# Patient Record
Sex: Female | Born: 1960 | Race: White | Hispanic: No | Smoking: Former smoker
Health system: Southern US, Community
[De-identification: ages and names within clinical notes are randomized; demographics above are authoritative.]

## PROBLEM LIST (undated history)

## (undated) DIAGNOSIS — R569 Unspecified convulsions: Secondary | ICD-10-CM

## (undated) DIAGNOSIS — G43909 Migraine, unspecified, not intractable, without status migrainosus: Secondary | ICD-10-CM

## (undated) DIAGNOSIS — F32A Depression, unspecified: Secondary | ICD-10-CM

## (undated) DIAGNOSIS — F419 Anxiety disorder, unspecified: Secondary | ICD-10-CM

## (undated) DIAGNOSIS — R51 Headache: Secondary | ICD-10-CM

## (undated) DIAGNOSIS — K589 Irritable bowel syndrome without diarrhea: Secondary | ICD-10-CM

## (undated) DIAGNOSIS — J40 Bronchitis, not specified as acute or chronic: Secondary | ICD-10-CM

## (undated) DIAGNOSIS — M1990 Unspecified osteoarthritis, unspecified site: Secondary | ICD-10-CM

## (undated) DIAGNOSIS — M797 Fibromyalgia: Secondary | ICD-10-CM

## (undated) DIAGNOSIS — F329 Major depressive disorder, single episode, unspecified: Secondary | ICD-10-CM

## (undated) DIAGNOSIS — E039 Hypothyroidism, unspecified: Secondary | ICD-10-CM

## (undated) DIAGNOSIS — G8929 Other chronic pain: Secondary | ICD-10-CM

## (undated) DIAGNOSIS — Z87442 Personal history of urinary calculi: Secondary | ICD-10-CM

## (undated) DIAGNOSIS — M549 Dorsalgia, unspecified: Secondary | ICD-10-CM

## (undated) DIAGNOSIS — K219 Gastro-esophageal reflux disease without esophagitis: Secondary | ICD-10-CM

## (undated) DIAGNOSIS — R002 Palpitations: Secondary | ICD-10-CM

## (undated) DIAGNOSIS — J45909 Unspecified asthma, uncomplicated: Secondary | ICD-10-CM

## (undated) DIAGNOSIS — D649 Anemia, unspecified: Secondary | ICD-10-CM

## (undated) DIAGNOSIS — F319 Bipolar disorder, unspecified: Secondary | ICD-10-CM

## (undated) HISTORY — PX: BACK SURGERY: SHX140

## (undated) HISTORY — PX: CHOLECYSTECTOMY: SHX55

## (undated) HISTORY — DX: Bipolar disorder, unspecified: F31.9

## (undated) HISTORY — DX: Migraine, unspecified, not intractable, without status migrainosus: G43.909

## (undated) HISTORY — PX: TUBAL LIGATION: SHX77

## (undated) HISTORY — PX: KNEE ARTHROSCOPY: SUR90

## (undated) HISTORY — PX: OTHER SURGICAL HISTORY: SHX169

---
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
## 1990-12-21 HISTORY — PX: ABDOMINAL HYSTERECTOMY: SHX81

## 2009-07-09 DIAGNOSIS — G8929 Other chronic pain: Secondary | ICD-10-CM | POA: Diagnosis present

## 2009-12-04 ENCOUNTER — Encounter: Admission: RE | Admit: 2009-12-04 | Discharge: 2009-12-04 | Payer: Self-pay | Admitting: Neurological Surgery

## 2010-04-24 ENCOUNTER — Inpatient Hospital Stay (HOSPITAL_COMMUNITY): Admission: RE | Admit: 2010-04-24 | Discharge: 2010-04-26 | Payer: Self-pay | Admitting: Neurological Surgery

## 2010-06-03 ENCOUNTER — Encounter: Admission: RE | Admit: 2010-06-03 | Discharge: 2010-06-03 | Payer: Self-pay | Admitting: Neurological Surgery

## 2010-07-10 DIAGNOSIS — F411 Generalized anxiety disorder: Secondary | ICD-10-CM | POA: Diagnosis present

## 2010-08-05 ENCOUNTER — Encounter: Admission: RE | Admit: 2010-08-05 | Discharge: 2010-08-05 | Payer: Self-pay | Admitting: Neurological Surgery

## 2010-09-23 ENCOUNTER — Encounter: Admission: RE | Admit: 2010-09-23 | Discharge: 2010-09-23 | Payer: Self-pay | Admitting: Neurological Surgery

## 2011-01-13 ENCOUNTER — Encounter
Admission: RE | Admit: 2011-01-13 | Discharge: 2011-01-13 | Payer: Self-pay | Source: Home / Self Care | Attending: Neurological Surgery | Admitting: Neurological Surgery

## 2011-03-10 LAB — CBC
Platelets: 232 10*3/uL (ref 150–400)
RBC: 4.4 MIL/uL (ref 3.87–5.11)
WBC: 5 10*3/uL (ref 4.0–10.5)

## 2011-03-10 LAB — SURGICAL PCR SCREEN
MRSA, PCR: NEGATIVE
Staphylococcus aureus: POSITIVE — AB

## 2011-03-10 LAB — BASIC METABOLIC PANEL
BUN: 16 mg/dL (ref 6–23)
Chloride: 105 mEq/L (ref 96–112)
Creatinine, Ser: 0.93 mg/dL (ref 0.4–1.2)
GFR calc Af Amer: >60 mL/min (ref >60)
GFR calc non Af Amer: >60 mL/min (ref >60)

## 2011-03-10 LAB — APTT: aPTT: 28 seconds (ref 24–37)

## 2011-03-10 LAB — PROTIME-INR
INR: 0.92 (ref 0.00–1.49)
Prothrombin Time: 12.3 seconds (ref 11.6–15.2)

## 2011-03-10 LAB — TYPE AND SCREEN: ABO/RH(D): O POS

## 2011-03-10 LAB — DIFFERENTIAL
Lymphocytes Relative: 20 % (ref 12–46)
Lymphs Abs: 1 10*3/uL (ref 0.7–4.0)
Monocytes Relative: 7 % (ref 3–12)
Neutro Abs: 3.4 10*3/uL (ref 1.7–7.7)
Neutrophils Relative %: 69 % (ref 43–77)

## 2011-03-10 LAB — ABO/RH: ABO/RH(D): O POS

## 2011-03-31 ENCOUNTER — Other Ambulatory Visit: Payer: Self-pay | Admitting: Neurological Surgery

## 2011-03-31 ENCOUNTER — Ambulatory Visit
Admission: RE | Admit: 2011-03-31 | Discharge: 2011-03-31 | Disposition: A | Discharge status: Home / Self Care | Payer: Worker's Compensation | Source: Ambulatory Visit | Attending: Neurological Surgery | Admitting: Neurological Surgery

## 2011-03-31 DIAGNOSIS — M549 Dorsalgia, unspecified: Secondary | ICD-10-CM

## 2011-09-21 ENCOUNTER — Other Ambulatory Visit: Payer: Self-pay | Admitting: Neurological Surgery

## 2011-09-21 ENCOUNTER — Ambulatory Visit
Admission: RE | Admit: 2011-09-21 | Discharge: 2011-09-21 | Disposition: A | Discharge status: Home / Self Care | Payer: Worker's Compensation | Source: Ambulatory Visit | Attending: Neurological Surgery | Admitting: Neurological Surgery

## 2011-09-21 DIAGNOSIS — M5137 Other intervertebral disc degeneration, lumbosacral region: Secondary | ICD-10-CM

## 2011-09-21 DIAGNOSIS — M5126 Other intervertebral disc displacement, lumbar region: Secondary | ICD-10-CM

## 2011-11-20 ENCOUNTER — Encounter (HOSPITAL_COMMUNITY): Payer: Self-pay | Admitting: Pharmacy Technician

## 2011-11-23 ENCOUNTER — Inpatient Hospital Stay (HOSPITAL_COMMUNITY): Admission: RE | Admit: 2011-11-23 | Payer: Self-pay | Source: Ambulatory Visit

## 2011-11-24 ENCOUNTER — Other Ambulatory Visit: Payer: Self-pay

## 2011-11-24 ENCOUNTER — Encounter (HOSPITAL_COMMUNITY): Payer: Self-pay

## 2011-11-24 ENCOUNTER — Ambulatory Visit (HOSPITAL_COMMUNITY)
Admission: RE | Admit: 2011-11-24 | Discharge: 2011-11-24 | Disposition: A | Discharge status: Home / Self Care | Payer: Worker's Compensation | Source: Ambulatory Visit | Attending: Neurological Surgery | Admitting: Neurological Surgery

## 2011-11-24 ENCOUNTER — Encounter (HOSPITAL_COMMUNITY)
Admission: RE | Admit: 2011-11-24 | Discharge: 2011-11-24 | Disposition: A | Discharge status: Home / Self Care | Payer: Worker's Compensation | Source: Ambulatory Visit | Attending: Neurological Surgery | Admitting: Neurological Surgery

## 2011-11-24 DIAGNOSIS — Z01812 Encounter for preprocedural laboratory examination: Secondary | ICD-10-CM | POA: Insufficient documentation

## 2011-11-24 DIAGNOSIS — Z01810 Encounter for preprocedural cardiovascular examination: Secondary | ICD-10-CM | POA: Insufficient documentation

## 2011-11-24 DIAGNOSIS — Z01818 Encounter for other preprocedural examination: Secondary | ICD-10-CM | POA: Insufficient documentation

## 2011-11-24 HISTORY — DX: Headache: R51

## 2011-11-24 HISTORY — DX: Anemia, unspecified: D64.9

## 2011-11-24 HISTORY — DX: Irritable bowel syndrome, unspecified: K58.9

## 2011-11-24 HISTORY — DX: Major depressive disorder, single episode, unspecified: F32.9

## 2011-11-24 HISTORY — DX: Depression, unspecified: F32.A

## 2011-11-24 HISTORY — DX: Anxiety disorder, unspecified: F41.9

## 2011-11-24 HISTORY — DX: Unspecified convulsions: R56.9

## 2011-11-24 LAB — TYPE AND SCREEN
ABO/RH(D): O POS
Antibody Screen: NEGATIVE

## 2011-11-24 LAB — DIFFERENTIAL
Basophils Relative: 1 % (ref 0–1)
Eosinophils Absolute: 0.3 10*3/uL (ref 0.0–0.7)
Monocytes Absolute: 0.5 10*3/uL (ref 0.1–1.0)
Monocytes Relative: 8 % (ref 3–12)
Neutrophils Relative %: 54 % (ref 43–77)

## 2011-11-24 LAB — BASIC METABOLIC PANEL
BUN: 18 mg/dL (ref 6–23)
Creatinine, Ser: 0.84 mg/dL (ref 0.50–1.10)
GFR calc Af Amer: >90 mL/min (ref >90)
GFR calc non Af Amer: 80 mL/min — ABNORMAL LOW (ref >90)
Potassium: 4.4 mEq/L (ref 3.5–5.1)

## 2011-11-24 LAB — CBC
Hemoglobin: 12.7 g/dL (ref 12.0–15.0)
MCH: 29.7 pg (ref 26.0–34.0)
MCHC: 33.6 g/dL (ref 30.0–36.0)
RDW: 12.5 % (ref 11.5–15.5)

## 2011-11-24 LAB — PROTIME-INR: INR: 0.97 (ref 0.00–1.49)

## 2011-11-24 NOTE — Pre-Procedure Instructions (Signed)
20 JODY AGUINAGA  11/24/2011   Your procedure is scheduled on:  November 26, 2011  Report to [facility] at 1100 AM.  Call this number if you have problems the morning of surgery: [phone]   Remember:   Do not eat food:After Midnight.  May have clear liquids: up to 4 Hours before arrival.  Clear liquids include soda, tea, black coffee, apple or grape juice, broth.  Take these medicines the morning of surgery with A SIP OF WATER: Valium, Lamictal, Morphine,   Stop all Vitamins   Do not wear jewelry, make-up or nail polish.  Do not wear lotions, powders, or perfumes. You may wear deodorant.  Do not shave 48 hours prior to surgery.  Do not bring valuables to the hospital.  Contacts, dentures or bridgework may not be worn into surgery.  Leave suitcase in the car. After surgery it may be brought to your room.  For patients admitted to the hospital, checkout time is 11:00 AM the day of discharge.   Special Instructions: CHG Shower Use Special Wash: 1/2 bottle night before surgery and 1/2 bottle morning of surgery.   Please read over the following fact sheets that you were given: Pain Booklet, Coughing and Deep Breathing, Blood Transfusion Information and Surgical Site Infection Prevention

## 2011-11-25 MED ORDER — CEFAZOLIN SODIUM 1-5 GM-% IV SOLN
1.0000 g | INTRAVENOUS | Status: AC
  Administered 2011-11-26: 1 g via INTRAVENOUS

## 2011-11-26 ENCOUNTER — Inpatient Hospital Stay (HOSPITAL_COMMUNITY): Payer: Worker's Compensation

## 2011-11-26 ENCOUNTER — Encounter (HOSPITAL_COMMUNITY): Payer: Self-pay | Admitting: Anesthesiology

## 2011-11-26 ENCOUNTER — Encounter (HOSPITAL_COMMUNITY): Payer: Self-pay | Admitting: Neurological Surgery

## 2011-11-26 ENCOUNTER — Inpatient Hospital Stay (HOSPITAL_COMMUNITY)
Admission: RE | Admit: 2011-11-26 | Discharge: 2011-11-30 | DRG: 460 | Disposition: A | Discharge status: Home / Self Care | Payer: Worker's Compensation | Source: Ambulatory Visit | Attending: Neurological Surgery | Admitting: Neurological Surgery

## 2011-11-26 ENCOUNTER — Inpatient Hospital Stay (HOSPITAL_COMMUNITY): Payer: Worker's Compensation | Admitting: Anesthesiology

## 2011-11-26 ENCOUNTER — Encounter (HOSPITAL_COMMUNITY): Payer: Self-pay | Admitting: *Deleted

## 2011-11-26 ENCOUNTER — Encounter (HOSPITAL_COMMUNITY)
Admission: RE | Disposition: A | Discharge status: Home / Self Care | Payer: Self-pay | Source: Ambulatory Visit | Attending: Neurological Surgery

## 2011-11-26 DIAGNOSIS — Z981 Arthrodesis status: Secondary | ICD-10-CM

## 2011-11-26 DIAGNOSIS — K589 Irritable bowel syndrome without diarrhea: Secondary | ICD-10-CM | POA: Diagnosis present

## 2011-11-26 DIAGNOSIS — T84498A Other mechanical complication of other internal orthopedic devices, implants and grafts, initial encounter: Principal | ICD-10-CM | POA: Diagnosis present

## 2011-11-26 DIAGNOSIS — F17200 Nicotine dependence, unspecified, uncomplicated: Secondary | ICD-10-CM | POA: Diagnosis present

## 2011-11-26 DIAGNOSIS — F411 Generalized anxiety disorder: Secondary | ICD-10-CM | POA: Diagnosis present

## 2011-11-26 DIAGNOSIS — Y832 Surgical operation with anastomosis, bypass or graft as the cause of abnormal reaction of the patient, or of later complication, without mention of misadventure at the time of the procedure: Secondary | ICD-10-CM | POA: Diagnosis present

## 2011-11-26 DIAGNOSIS — K219 Gastro-esophageal reflux disease without esophagitis: Secondary | ICD-10-CM | POA: Diagnosis present

## 2011-11-26 DIAGNOSIS — J45909 Unspecified asthma, uncomplicated: Secondary | ICD-10-CM | POA: Diagnosis present

## 2011-11-26 DIAGNOSIS — M545 Low back pain, unspecified: Secondary | ICD-10-CM | POA: Diagnosis present

## 2011-11-26 DIAGNOSIS — F3289 Other specified depressive episodes: Secondary | ICD-10-CM | POA: Diagnosis present

## 2011-11-26 DIAGNOSIS — F329 Major depressive disorder, single episode, unspecified: Secondary | ICD-10-CM | POA: Diagnosis present

## 2011-11-26 SURGERY — POSTERIOR LUMBAR FUSION 1 LEVEL
Anesthesia: General | Site: Back | Laterality: Bilateral | Wound class: Clean

## 2011-11-26 MED ORDER — MORPHINE SULFATE 2 MG/ML IJ SOLN: 0.0500 mg/kg | INTRAMUSCULAR | Status: DC | PRN

## 2011-11-26 MED ORDER — LACTATED RINGERS IV SOLN
INTRAVENOUS | Status: DC | PRN
  Administered 2011-11-26 (×3): via INTRAVENOUS

## 2011-11-26 MED ORDER — HEMOSTATIC AGENTS (NO CHARGE) OPTIME
TOPICAL | Status: DC | PRN
  Administered 2011-11-26: 1 via TOPICAL

## 2011-11-26 MED ORDER — EPHEDRINE SULFATE 50 MG/ML IJ SOLN
INTRAMUSCULAR | Status: DC | PRN
  Administered 2011-11-26: 10 mg via INTRAVENOUS

## 2011-11-26 MED ORDER — GLYCOPYRROLATE 0.2 MG/ML IJ SOLN
INTRAMUSCULAR | Status: DC | PRN
  Administered 2011-11-26: .4 mg via INTRAVENOUS
  Administered 2011-11-26: .2 mg via INTRAVENOUS

## 2011-11-26 MED ORDER — MIDAZOLAM HCL 5 MG/5ML IJ SOLN
INTRAMUSCULAR | Status: DC | PRN
  Administered 2011-11-26: 2 mg via INTRAVENOUS

## 2011-11-26 MED ORDER — ONDANSETRON HCL 4 MG/2ML IJ SOLN: 4.0000 mg | INTRAMUSCULAR | Status: DC | PRN

## 2011-11-26 MED ORDER — SODIUM CHLORIDE 0.9 % IR SOLN
Status: DC | PRN
  Administered 2011-11-26: 15:00:00

## 2011-11-26 MED ORDER — POTASSIUM CHLORIDE IN NACL 20-0.9 MEQ/L-% IV SOLN
INTRAVENOUS | Status: DC
  Administered 2011-11-26: 23:00:00 via INTRAVENOUS
  Filled 2011-11-26 (×8): qty 1000

## 2011-11-26 MED ORDER — PROPOFOL 10 MG/ML IV EMUL
INTRAVENOUS | Status: DC | PRN
  Administered 2011-11-26: 150 mg via INTRAVENOUS

## 2011-11-26 MED ORDER — ONDANSETRON HCL 4 MG/2ML IJ SOLN
INTRAMUSCULAR | Status: DC | PRN
  Administered 2011-11-26: 4 mg via INTRAVENOUS

## 2011-11-26 MED ORDER — ZOLPIDEM TARTRATE 10 MG PO TABS: 10.0000 mg | ORAL_TABLET | Freq: Every evening | ORAL | Status: DC | PRN

## 2011-11-26 MED ORDER — ACETAMINOPHEN 650 MG RE SUPP: 650.0000 mg | RECTAL | Status: DC | PRN

## 2011-11-26 MED ORDER — SODIUM CHLORIDE 0.9 % IJ SOLN
3.0000 mL | Freq: Two times a day (BID) | INTRAMUSCULAR | Status: DC
  Administered 2011-11-27 – 2011-11-30 (×7): 3 mL via INTRAVENOUS

## 2011-11-26 MED ORDER — SODIUM CHLORIDE 0.9 % IJ SOLN
3.0000 mL | INTRAMUSCULAR | Status: DC | PRN
  Administered 2011-11-27: 3 mL via INTRAVENOUS

## 2011-11-26 MED ORDER — THROMBIN 20000 UNITS EX KIT
PACK | CUTANEOUS | Status: DC | PRN
  Administered 2011-11-26: 20000 [IU] via TOPICAL

## 2011-11-26 MED ORDER — NEOSTIGMINE METHYLSULFATE 1 MG/ML IJ SOLN
INTRAMUSCULAR | Status: DC | PRN
  Administered 2011-11-26: 2 mg via INTRAVENOUS

## 2011-11-26 MED ORDER — ACETAMINOPHEN 325 MG PO TABS: 650.0000 mg | ORAL_TABLET | ORAL | Status: DC | PRN

## 2011-11-26 MED ORDER — PROMETHAZINE HCL 25 MG/ML IJ SOLN: 6.2500 mg | INTRAMUSCULAR | Status: DC | PRN

## 2011-11-26 MED ORDER — BUPROPION HCL ER (XL) 150 MG PO TB24
150.0000 mg | ORAL_TABLET | Freq: Every day | ORAL | Status: DC
  Administered 2011-11-26 – 2011-11-29 (×4): 150 mg via ORAL
  Filled 2011-11-26 (×5): qty 1

## 2011-11-26 MED ORDER — MENTHOL 3 MG MT LOZG: 1.0000 | LOZENGE | OROMUCOSAL | Status: DC | PRN

## 2011-11-26 MED ORDER — DIAZEPAM 5 MG PO TABS
5.0000 mg | ORAL_TABLET | Freq: Two times a day (BID) | ORAL | Status: DC
  Administered 2011-11-26 – 2011-11-30 (×8): 5 mg via ORAL
  Filled 2011-11-26 (×8): qty 1

## 2011-11-26 MED ORDER — SODIUM CHLORIDE 0.9 % IV SOLN
250.0000 mL | INTRAVENOUS | Status: DC
  Administered 2011-11-26: 250 mL via INTRAVENOUS

## 2011-11-26 MED ORDER — PHENOL 1.4 % MT LIQD: 1.0000 | OROMUCOSAL | Status: DC | PRN

## 2011-11-26 MED ORDER — OXYCODONE-ACETAMINOPHEN 5-325 MG PO TABS
1.0000 | ORAL_TABLET | ORAL | Status: DC | PRN
  Administered 2011-11-27 – 2011-11-30 (×11): 2 via ORAL
  Filled 2011-11-26 (×6): qty 2
  Filled 2011-11-26: qty 1
  Filled 2011-11-26 (×5): qty 2

## 2011-11-26 MED ORDER — HYDROMORPHONE HCL PF 1 MG/ML IJ SOLN
0.5000 mg | INTRAMUSCULAR | Status: DC | PRN
  Administered 2011-11-26 – 2011-11-28 (×8): 1 mg via INTRAVENOUS
  Filled 2011-11-26 (×8): qty 1

## 2011-11-26 MED ORDER — PHENYLEPHRINE HCL 10 MG/ML IJ SOLN
INTRAMUSCULAR | Status: DC | PRN
  Administered 2011-11-26: 80 ug via INTRAVENOUS
  Administered 2011-11-26: 160 ug via INTRAVENOUS

## 2011-11-26 MED ORDER — DEXAMETHASONE SODIUM PHOSPHATE 10 MG/ML IJ SOLN
10.0000 mg | Freq: Once | INTRAMUSCULAR | Status: AC
  Administered 2011-11-26: 10 mg via INTRAVENOUS

## 2011-11-26 MED ORDER — BUPIVACAINE HCL (PF) 0.25 % IJ SOLN
INTRAMUSCULAR | Status: DC | PRN
  Administered 2011-11-26: 5 mL
  Administered 2011-11-26: 10 mL

## 2011-11-26 MED ORDER — MIDAZOLAM HCL 2 MG/2ML IJ SOLN: 0.5000 mg | Freq: Once | INTRAMUSCULAR | Status: DC | PRN

## 2011-11-26 MED ORDER — FENTANYL CITRATE 0.05 MG/ML IJ SOLN
INTRAMUSCULAR | Status: DC | PRN
  Administered 2011-11-26 (×2): 50 ug via INTRAVENOUS
  Administered 2011-11-26: 100 ug via INTRAVENOUS
  Administered 2011-11-26 (×3): 50 ug via INTRAVENOUS

## 2011-11-26 MED ORDER — MEPERIDINE HCL 25 MG/ML IJ SOLN: 6.2500 mg | INTRAMUSCULAR | Status: DC | PRN

## 2011-11-26 MED ORDER — CARISOPRODOL 350 MG PO TABS
350.0000 mg | ORAL_TABLET | Freq: Three times a day (TID) | ORAL | Status: DC
  Administered 2011-11-26 – 2011-11-30 (×10): 350 mg via ORAL
  Filled 2011-11-26 (×11): qty 1

## 2011-11-26 MED ORDER — CEFAZOLIN SODIUM 1-5 GM-% IV SOLN
1.0000 g | Freq: Three times a day (TID) | INTRAVENOUS | Status: AC
  Administered 2011-11-26 – 2011-11-27 (×2): 1 g via INTRAVENOUS
  Filled 2011-11-26 (×2): qty 50

## 2011-11-26 MED ORDER — DICYCLOMINE HCL 20 MG PO TABS
20.0000 mg | ORAL_TABLET | Freq: Four times a day (QID) | ORAL | Status: DC | PRN
  Filled 2011-11-26: qty 1

## 2011-11-26 MED ORDER — VECURONIUM BROMIDE 10 MG IV SOLR
INTRAVENOUS | Status: DC | PRN
  Administered 2011-11-26: 1 mg via INTRAVENOUS
  Administered 2011-11-26: 2 mg via INTRAVENOUS
  Administered 2011-11-26: 7 mg via INTRAVENOUS

## 2011-11-26 MED ORDER — SODIUM CHLORIDE 0.9 % IR SOLN
Status: DC | PRN
  Administered 2011-11-26: 1000 mL

## 2011-11-26 MED ORDER — HYDROXYZINE PAMOATE 50 MG PO CAPS
50.0000 mg | ORAL_CAPSULE | Freq: Two times a day (BID) | ORAL | Status: DC
  Administered 2011-11-26 – 2011-11-28 (×5): 50 mg via ORAL
  Filled 2011-11-26 (×7): qty 1

## 2011-11-26 MED ORDER — HYDROMORPHONE HCL PF 1 MG/ML IJ SOLN
0.2500 mg | INTRAMUSCULAR | Status: DC | PRN
  Administered 2011-11-26 (×2): 0.25 mg via INTRAVENOUS
  Administered 2011-11-26: 0.5 mg via INTRAVENOUS
  Administered 2011-11-26: 0.25 mg via INTRAVENOUS

## 2011-11-26 MED ORDER — LAMOTRIGINE 100 MG PO TABS
100.0000 mg | ORAL_TABLET | Freq: Every day | ORAL | Status: DC
  Administered 2011-11-26 – 2011-11-29 (×4): 100 mg via ORAL
  Filled 2011-11-26 (×5): qty 1

## 2011-11-26 SURGICAL SUPPLY — 55 items
BAG DECANTER FOR FLEXI CONT (MISCELLANEOUS) ×2 IMPLANT
BENZOIN TINCTURE PRP APPL 2/3 (GAUZE/BANDAGES/DRESSINGS) ×2 IMPLANT
BLADE SURG ROTATE 9660 (MISCELLANEOUS) IMPLANT
BUR MATCHSTICK NEURO 3.0 LAGG (BURR) ×2 IMPLANT
CANISTER SUCTION 2500CC (MISCELLANEOUS) ×2 IMPLANT
CLOSURE STERI STRIP 1/2 X4 (GAUZE/BANDAGES/DRESSINGS) ×2 IMPLANT
CLOTH BEACON ORANGE TIMEOUT ST (SAFETY) ×2 IMPLANT
CONT SPEC 4OZ CLIKSEAL STRL BL (MISCELLANEOUS) ×2 IMPLANT
COVER BACK TABLE 24X17X13 BIG (DRAPES) IMPLANT
COVER TABLE BACK 60X90 (DRAPES) ×2 IMPLANT
DRAPE C-ARM 42X72 X-RAY (DRAPES) ×2 IMPLANT
DRAPE C-ARMOR (DRAPES) ×2 IMPLANT
DRAPE LAPAROTOMY 100X72X124 (DRAPES) ×2 IMPLANT
DRAPE POUCH INSTRU U-SHP 10X18 (DRAPES) ×2 IMPLANT
DRAPE SURG 17X23 STRL (DRAPES) ×2 IMPLANT
DRESSING TELFA 8X3 (GAUZE/BANDAGES/DRESSINGS) ×2 IMPLANT
DRSG OPSITE 4X5.5 SM (GAUZE/BANDAGES/DRESSINGS) ×2 IMPLANT
DURAPREP 26ML APPLICATOR (WOUND CARE) ×2 IMPLANT
ELECT REM PT RETURN 9FT ADLT (ELECTROSURGICAL) ×2
ELECTRODE REM PT RTRN 9FT ADLT (ELECTROSURGICAL) ×1 IMPLANT
EVACUATOR 1/8 PVC DRAIN (DRAIN) ×2 IMPLANT
GAUZE SPONGE 4X4 16PLY XRAY LF (GAUZE/BANDAGES/DRESSINGS) IMPLANT
GLOVE BIO SURGEON STRL SZ8 (GLOVE) ×4 IMPLANT
GLOVE ECLIPSE 7.5 STRL STRAW (GLOVE) ×2 IMPLANT
GOWN BRE IMP SLV AUR LG STRL (GOWN DISPOSABLE) ×2 IMPLANT
GOWN BRE IMP SLV AUR XL STRL (GOWN DISPOSABLE) ×8 IMPLANT
GOWN STRL REIN 2XL LVL4 (GOWN DISPOSABLE) IMPLANT
GRAFT BN 5X1XSPNE CVD POST DBM (Bone Implant) ×1 IMPLANT
GRAFT BONE MAGNIFUSE 1X5CM (Bone Implant) ×1 IMPLANT
HEMOSTAT POWDER KIT SURGIFOAM (HEMOSTASIS) IMPLANT
KIT BASIN OR (CUSTOM PROCEDURE TRAY) ×2 IMPLANT
KIT ROOM TURNOVER OR (KITS) ×2 IMPLANT
NEEDLE HYPO 25X1 1.5 SAFETY (NEEDLE) ×2 IMPLANT
NS IRRIG 1000ML POUR BTL (IV SOLUTION) ×2 IMPLANT
PACK LAMINECTOMY NEURO (CUSTOM PROCEDURE TRAY) ×2 IMPLANT
PAD ARMBOARD 7.5X6 YLW CONV (MISCELLANEOUS) ×6 IMPLANT
ROD EXPEDIUM PREBENT 5.5X30MM (Rod) ×2 IMPLANT
ROD EXPEDIUM PREBENT 5.5X35MM (Rod) ×2 IMPLANT
SCREW EXPEDIUM POLYAXIAL 6X40 (Screw) ×2 IMPLANT
SCREW EXPEDIUM POLYAXIAL 6X45M (Screw) ×6 IMPLANT
SCREW SET SINGLE INNER (Screw) ×8 IMPLANT
SPONGE LAP 4X18 X RAY DECT (DISPOSABLE) IMPLANT
SPONGE SURGIFOAM ABS GEL 100 (HEMOSTASIS) ×2 IMPLANT
STRIP CLOSURE SKIN 1/2X4 (GAUZE/BANDAGES/DRESSINGS) ×4 IMPLANT
SUT VIC AB 0 CT1 18XCR BRD8 (SUTURE) ×2 IMPLANT
SUT VIC AB 0 CT1 8-18 (SUTURE) ×2
SUT VIC AB 2-0 CP2 18 (SUTURE) ×2 IMPLANT
SUT VIC AB 3-0 SH 8-18 (SUTURE) ×4 IMPLANT
SYR 20ML ECCENTRIC (SYRINGE) ×2 IMPLANT
SYR CONTROL 10ML LL (SYRINGE) ×2 IMPLANT
TOWEL OR 17X24 6PK STRL BLUE (TOWEL DISPOSABLE) ×2 IMPLANT
TOWEL OR 17X26 10 PK STRL BLUE (TOWEL DISPOSABLE) ×2 IMPLANT
TRAP SPECIMEN MUCOUS 40CC (MISCELLANEOUS) IMPLANT
TRAY FOLEY CATH 14FRSI W/METER (CATHETERS) ×2 IMPLANT
WATER STERILE IRR 1000ML POUR (IV SOLUTION) ×2 IMPLANT

## 2011-11-26 NOTE — Op Note (Signed)
11/26/2011  4:56 PM  PATIENT:  Katelyn Galloway  50 y.o. 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  1. Posterior fixation L4-5 using Depuy spine pedicle screws.  2. Intertransverse arthrodesis L4-5 using morcellized autograft and allograft. 3. harvesting of iliac crest autograft through a separate fascial incision  SURGEON:  Marikay Alar, MD  ASSISTANTS: Dr. Gerlene Fee  ANESTHESIA:  General  EBL: 200 ml  Total I/O In: 2000 [I.V.:2000] Out: 1800 [Urine:1600; Blood:200]  BLOOD ADMINISTERED:none  DRAINS: Hemovac   INDICATION FOR PROCEDURE: This patient underwent a anterior fusion at L4-5 over a year ago. She developed progressive back pain and leg. She had a CT scan which showed a pseudoarthrosis at L4-5 with loosening of her posterior hardware. I recommended a lumbar reexploration with redo fusion at L4-5 with pedicle screws. Patient understood the risks, benefits, and alternatives and potential outcomes and wished to proceed.  PROCEDURE DETAILS:  The patient was brought to the operating room. After induction of generalized endotracheal anesthesia the patient was rolled into the prone position on chest rolls and all pressure points were padded. The patient's lumbar region was cleaned and then prepped with DuraPrep and draped in the usual sterile fashion. Anesthesia was injected and then a dorsal midline incision was made and carried down to the lumbosacral fascia. The fascia was opened and the paraspinous musculature was taken down in a subperiosteal fashion to expose L4-5. The old interspinous plate was loosened and removed.  Intraoperative fluoroscopy confirmed my level. I made a separate fascial incision and exposed the right iliac crest and used osteotome and other instruments to remove autograft. I then waxed the hole, and aligned it with Gelfoam, and closed the fascia with interrupted 0 Vicryl.  I then turned my attention to the posterior fixation. The pedicle screw entry zones were identified utilizing surface landmarks and AP and lateral fluoroscopy. We probed each pedicle with the pedicle probe and tapped each pedicle with the appropriate tap. We palpated with a ball probe to assure no break in the cortex. We then placed 60 by 45 mm pedicle screws into the pedicles bilaterally at L4-5. We then decorticated the transverse processes and laid a mixture of morcellized autograft and allograft out over these to perform intertransverse arthrodesis at 45. We then placed lordotic rods into the multiaxial screw heads of the pedicle screws and locked these in position with the locking caps and anti-torque device.  We then checked our construct with AP and lateral fluoroscopy. Irrigated with copious amounts of bacitracin-containing saline solution. Placed a medium Hemovac drain through separate stab incision. I closed the muscle and the fascia with 0 Vicryl. Closed the subcutaneous tissues with 2-0 Vicryl and subcuticular tissues with 3-0 Vicryl. The skin was closed with benzoin and Steri-Strips. Dressing was then applied, the patient was awakened from general anesthesia and transported to the recovery room in stable condition. At the end of the procedure all sponge, needle and instrument counts were correct.   PLAN OF CARE: Admit to inpatient   PATIENT DISPOSITION:  PACU - hemodynamically stable.   Delay start of Pharmacological VTE agent (>24hrs) due to surgical blood loss or risk of bleeding:  yes

## 2011-11-26 NOTE — Anesthesia Postprocedure Evaluation (Signed)
  Anesthesia Post-op Note  Patient: Katelyn Galloway  Procedure(s) Performed:  POSTERIOR LUMBAR FUSION 1 LEVEL - Lumbar Four-Five Posterior Lumbar Fusion with Pedicle Screws, Removal of Hardware, and Iliac Crest Harvest  Patient Location: PACU  Anesthesia Type: General  Level of Consciousness: awake  Airway and Oxygen Therapy: Patient Spontanous Breathing  Post-op Pain: mild  Post-op Assessment: Post-op Vital signs reviewed  Post-op Vital Signs: stable  Complications: No apparent anesthesia complications

## 2011-11-26 NOTE — Transfer of Care (Signed)
Immediate Anesthesia Transfer of Care Note  Patient: Katelyn Galloway  Procedure(s) Performed:  POSTERIOR LUMBAR FUSION 1 LEVEL - Lumbar Four-Five Posterior Lumbar Fusion with Pedicle Screws, Removal of Hardware, and Iliac Crest Harvest  Patient Location: PACU  Anesthesia Type: General  Level of Consciousness: awake, alert  and oriented  Airway & Oxygen Therapy: Patient Spontanous Breathing and Patient connected to nasal cannula oxygen  Post-op Assessment: Report given to PACU RN, Post -op Vital signs reviewed and stable and Patient moving all extremities X 4  Post vital signs: Reviewed and stable  Complications: No apparent anesthesia complications

## 2011-11-26 NOTE — Preoperative (Signed)
Beta Blockers   Reason not to administer Beta Blockers:Not Applicable 

## 2011-11-26 NOTE — Anesthesia Preprocedure Evaluation (Addendum)
Anesthesia Evaluation  Patient identified by MRN, date of birth, ID band Patient awake    Reviewed: Allergy & Precautions, H&P , NPO status , Patient's Chart, lab work & pertinent test results  Airway Mallampati: II      Dental  (+) Dental Advisory Given   Pulmonary asthma ,  Childhood Asthma clear to auscultation        Cardiovascular neg cardio ROS Regular Normal    Neuro/Psych  Headaches, Seizures -,  PSYCHIATRIC DISORDERS Anxiety Depression    GI/Hepatic Neg liver ROS, GERD-  Controlled,  Endo/Other  Negative Endocrine ROS  Renal/GU Hx of kidney stones     Musculoskeletal negative musculoskeletal ROS (+)   Abdominal   Peds  Hematology   Anesthesia Other Findings   Reproductive/Obstetrics                         Anesthesia Physical Anesthesia Plan  ASA: II  Anesthesia Plan: General   Post-op Pain Management:    Induction: Intravenous  Airway Management Planned: Oral ETT  Additional Equipment:   Intra-op Plan:   Post-operative Plan: Extubation in OR  Informed Consent: I have reviewed the patients History and Physical, chart, labs and discussed the procedure including the risks, benefits and alternatives for the proposed anesthesia with the patient or authorized representative who has indicated his/her understanding and acceptance.   Dental advisory given  Plan Discussed with: CRNA  Anesthesia Plan Comments:         Anesthesia Quick Evaluation

## 2011-11-26 NOTE — Anesthesia Procedure Notes (Addendum)
Procedure Name: Intubation Date/Time: 11/26/2011 2:14 PM Performed by: Carmela Rima Pre-anesthesia Checklist: Patient identified, Timeout performed, Emergency Drugs available, Suction available and Patient being monitored Patient Re-evaluated:Patient Re-evaluated prior to inductionOxygen Delivery Method: Circle System Utilized Preoxygenation: Pre-oxygenation with 100% oxygen Intubation Type: IV induction Ventilation: Mask ventilation without difficulty Laryngoscope Size: Mac and 3 Grade View: Grade I Tube type: Oral Tube size: 7.5 mm Number of attempts: 1 Placement Confirmation: ETT inserted through vocal cords under direct vision,  breath sounds checked- equal and bilateral,  CO2 detector and positive ETCO2 Secured at: 21 cm Tube secured with: Tape Dental Injury: Teeth and Oropharynx as per pre-operative assessment

## 2011-11-26 NOTE — Anesthesia Postprocedure Evaluation (Signed)
  Anesthesia Post-op Note  Patient: Katelyn Galloway  Procedure(s) Performed:  POSTERIOR LUMBAR FUSION 1 LEVEL - Lumbar Four-Five Posterior Lumbar Fusion with Pedicle Screws, Removal of Hardware, and Iliac Crest Harvest  Patient Location: PACU  Anesthesia Type: General  Level of Consciousness: awake and alert   Airway and Oxygen Therapy: Patient Spontanous Breathing  Post-op Pain: mild  Post-op Assessment: Post-op Vital signs reviewed  Post-op Vital Signs: stable  Complications: No apparent anesthesia complications

## 2011-11-26 NOTE — H&P (Signed)
Subjective: Patient is a 50 y.o. female admitted for Re-do fusion L4-5. Onset of symptoms was many mos ago, slowly progressive since that time.  The pain is rated moderate, can be severe, and is located at the back  and radiates to legs. The pain is described as aching and occurs often. The symptoms have been progressive. Symptoms are exacerbated by walking, standing. MRI or CT showed pseudoarthrosis L4-5.  Past Medical History  Diagnosis Date  . Anxiety   . Depression   . Anemia 1970  . Seizures     x 2 during pregnancy per pt was not diagnosed with eclampsia  . Headache   . Kidney stones   . Irritable bowel syndrome (IBS)     Past Surgical History  Procedure Date  . Abdominal hysterectomy 1992  . Knee arthroscopy     right knee  . Tubal ligation   . Cholecystectomy 2009 or 2010  . Kindey stone removal     Prior to Admission medications   Medication Sig Start Date End Date Taking? Authorizing Provider  B Complex-C (B-COMPLEX WITH VITAMIN C) tablet Take 1 tablet by mouth every morning.      Historical Provider, MD  buPROPion (WELLBUTRIN XL) 150 MG 24 hr tablet Take 150 mg by mouth at bedtime.      Historical Provider, MD  carisoprodol (SOMA) 350 MG tablet Take 350 mg by mouth 3 (three) times daily.      Historical Provider, MD  cholecalciferol (VITAMIN D) 1000 UNITS tablet Take 2,000 Units by mouth at bedtime.      Historical Provider, MD  Cranberry 425 MG CAPS Take 1 capsule by mouth 2 (two) times daily.      Historical Provider, MD  diazepam (VALIUM) 5 MG tablet Take 5 mg by mouth 2 (two) times daily.      Historical Provider, MD  dicyclomine (BENTYL) 20 MG tablet Take 20 mg by mouth 4 (four) times daily as needed. For irritable bowel syndrome     Historical Provider, MD  hydrOXYzine (VISTARIL) 50 MG capsule Take 50 mg by mouth 2 (two) times daily.      Historical Provider, MD  lamoTRIgine (LAMICTAL) 25 MG tablet Take 100 mg by mouth at bedtime.      Historical Provider, MD    meloxicam (MOBIC) 7.5 MG tablet Take 7.5 mg by mouth 2 (two) times daily.      Historical Provider, MD  morphine (MSIR) 30 MG tablet Take 30 mg by mouth 3 (three) times daily.      Historical Provider, MD  Nutritional Supplements (ESTROVEN PO) Take 1 tablet by mouth daily.      Historical Provider, MD  OVER THE COUNTER MEDICATION Take 1 capsule by mouth 2 (two) times daily. Magnesium & Zinc    Historical Provider, MD  polyethylene glycol (MIRALAX / GLYCOLAX) packet Take 17 g by mouth daily.      Historical Provider, MD   Allergies  Allergen Reactions  . Gabapentin Other (See Comments)    History  Substance Use Topics  . Smoking status: Current Some Day Smoker -- 0.2 packs/day  . Smokeless tobacco: Never Used   Comment: Pt reports using an electric cigarette ~ 2 times a day  . Alcohol Use: 0.6 oz/week    1 Glasses of wine per week    Family History  Problem Relation Age of Onset  . Anesthesia problems Neg Hx      Review of Systems  Positive ROS: neg  All other  systems have been reviewed and were otherwise negative with the exception of those mentioned in the HPI and as above.  Objective: Vital signs in last 24 hours:    General Appearance: Alert, cooperative, no distress, appears stated age Head: Normocephalic, without obvious abnormality, atraumatic Eyes: PERRL, conjunctiva/corneas clear, EOM's intact, fundi benign, both eyes      Ears: Normal TM's and external ear canals, both ears Throat: Lips, mucosa, and tongue normal; teeth and gums normal Neck: Supple, symmetrical, trachea midline, no adenopathy; thyroid: No enlargement/tenderness/nodules; no carotid bruit or JVD Back: Symmetric, no curvature, ROM normal, no CVA tenderness Lungs: Clear to auscultation bilaterally, respirations unlabored Heart: Regular rate and rhythm, S1 and S2 normal, no murmur, rub or gallop Abdomen: Soft, non-tender, bowel sounds active all four quadrants, no masses, no organomegaly Extremities:  Extremities normal, atraumatic, no cyanosis or edema Pulses: 2+ and symmetric all extremities Skin: Skin color, texture, turgor normal, no rashes or lesions  NEUROLOGIC:   Mental status: Alert and oriented x4,  no aphasia, good attention span, fund of knowledge, and memory Motor Exam - grossly normal Sensory Exam - grossly normal Reflexes: normal Coordination - grossly normal Gait - grossly normal Balance - grossly normal Cranial Nerves: I: smell Not tested  II: visual acuity  OS: nl    OD: nl  II: visual fields Full to confrontation  II: pupils Equal, round, reactive to light  III,VII: ptosis None  III,IV,VI: extraocular muscles  Full ROM  V: mastication Normal  V: facial light touch sensation  Normal  V,VII: corneal reflex  Present  VII: facial muscle function - upper  Normal  VII: facial muscle function - lower Normal  VIII: hearing Not tested  IX: soft palate elevation  Normal  IX,X: gag reflex Present  XI: trapezius strength  5/5  XI: sternocleidomastoid strength 5/5  XI: neck flexion strength  5/5  XII: tongue strength  Normal    Data Review Lab Results  Component Value Date   WBC 6.4 11/24/2011   HGB 12.7 11/24/2011   HCT 37.8 11/24/2011   MCV 88.5 11/24/2011   PLT 244 11/24/2011   Lab Results  Component Value Date   NA 138 11/24/2011   K 4.4 11/24/2011   CL 99 11/24/2011   CO2 34* 11/24/2011   BUN 18 11/24/2011   CREATININE 0.84 11/24/2011   GLUCOSE 91 11/24/2011   Lab Results  Component Value Date   INR 0.97 11/24/2011    Assessment/Plan: Patient admitted for re-do fusion L4-5 for pseudoarthrosis. Patient has failed conservative therapy.  I explained the condition and procedure to the patient and answered any questions.  Patient wishes to proceed with procedure as planned. Understands risks/ benefits and typical outcomes of procedure.   JONES,DAVID S 11/26/2011 6:47 AM

## 2011-11-27 MED ORDER — POLYETHYLENE GLYCOL 3350 17 G PO PACK
17.0000 g | PACK | Freq: Every day | ORAL | Status: DC
  Administered 2011-11-27: 17 g via ORAL
  Filled 2011-11-27 (×2): qty 1

## 2011-11-27 NOTE — Progress Notes (Signed)
Patient ID: Katelyn Galloway, female   DOB: October 22, 1961, 50 y.o.   MRN: [id number] Subjective: Patient reports approp back soreness, no leg pain. No N/T/W.  Objective: Vital signs in last 24 hours: Temp:  [97.8 F (36.6 C)-98.2 F (36.8 C)] 98 F (36.7 C) (12/07 0600) Pulse Rate:  [66-72] 67  (12/07 0600) Resp:  [18] 18  (12/07 0600) BP: (92-116)/(55-75) 103/67 mmHg (12/07 0600) SpO2:  [93 %-100 %] 98 % (12/07 0600) Weight:  [58 kg (127 lb 13.9 oz)] 127 lb 13.9 oz (58 kg) (12/07 0615)  Intake/Output from previous day: 12/06 0701 - 12/07 0700 In: 2803.8 [P.O.:240; I.V.:2463.8; IV Piggyback:100] Out: 2820 [Urine:2520; Drains:100; Blood:200] Intake/Output this shift:    Neurologic: Grossly normal  Lab Results: Lab Results  Component Value Date   WBC 6.4 11/24/2011   HGB 12.7 11/24/2011   HCT 37.8 11/24/2011   MCV 88.5 11/24/2011   PLT 244 11/24/2011   Lab Results  Component Value Date   INR 0.97 11/24/2011   BMET Lab Results  Component Value Date   NA 138 11/24/2011   K 4.4 11/24/2011   CL 99 11/24/2011   CO2 34* 11/24/2011   GLUCOSE 91 11/24/2011   BUN 18 11/24/2011   CREATININE 0.84 11/24/2011   CALCIUM 9.5 11/24/2011    Studies/Results: Dg Lumbar Spine 2-3 Views  11/26/2011  *RADIOLOGY REPORT*  Clinical Data: Back pain.  LUMBAR SPINE - 2-3 VIEW  Comparison: Plain films 09/21/2011.  Findings: There has been revision of the previous L4-5 fusion. Interspinous fixation device has been removed and replaced with bilateral L4-L5 pedicle screws.  Interbody spacer remains.  IMPRESSION: As above.  Original Report Authenticated By: Elsie Stain, M.D.    Assessment/Plan: Doing well, mobilize today.   LOS: 1 day    JONES,DAVID S 11/27/2011, 8:06 AM

## 2011-11-27 NOTE — Progress Notes (Signed)
Physical Therapy Evaluation Patient Details Name: Katelyn Galloway MRN: [id number] DOB: 1961/08/26 Today's Date: 11/27/2011  Problem List: There is no problem list on file for this patient.   Past Medical History:  Past Medical History  Diagnosis Date  . Anxiety   . Depression   . Anemia 1970  . Seizures     x 2 during pregnancy per pt was not diagnosed with eclampsia  . Headache   . Kidney stones   . Irritable bowel syndrome (IBS)    Past Surgical History:  Past Surgical History  Procedure Date  . Abdominal hysterectomy 1992  . Knee arthroscopy     right knee  . Tubal ligation   . Cholecystectomy 2009 or 2010  . Kindey stone removal   . Back surgery     PT Assessment/Plan/Recommendation PT Assessment Clinical Impression Statement: Pt s/p second back surgery this year due to pseudoarthrosis. Pt unrealistic regarding her ability to care for her dog upon d/c home and lengthy discussion re: finding someone to care for dog vs boarding dog until she can manage dog and maintain back precautions.  Pt will benefit from PT to maximize safety and independence prior to d/c home PT Recommendation/Assessment: Patient will need skilled PT in the acute care venue PT Problem List: Decreased activity tolerance;Decreased knowledge of use of DME;Decreased knowledge of precautions;Pain Barriers to Discharge: Decreased caregiver support PT Therapy Diagnosis : Difficulty walking;Acute pain PT Plan PT Frequency: Min 5X/week PT Treatment/Interventions: DME instruction;Gait training;Stair training;Functional mobility training;Therapeutic activities;Patient/family education PT Recommendation Follow Up Recommendations: Home health PT Equipment Recommended: Rolling walker with 5" wheels PT Goals  Acute Rehab PT Goals PT Goal Formulation: With patient Time For Goal Achievement: 7 days Pt will Roll Supine to Left Side: with modified independence PT Goal: Rolling Supine to Left Side - Progress:  Other (comment) Pt will go Supine/Side to Sit: with modified independence;with HOB 0 degrees PT Goal: Supine/Side to Sit - Progress: Other (comment) Pt will go Sit to Supine/Side: with modified independence;with HOB 0 degrees PT Goal: Sit to Supine/Side - Progress: Other (comment) Pt will go Sit to Stand: with modified independence PT Goal: Sit to Stand - Progress: Other (comment) Pt will go Stand to Sit: with modified independence PT Goal: Stand to Sit - Progress: Other (comment) Pt will Ambulate: >150 feet;with modified independence;with least restrictive assistive device PT Goal: Ambulate - Progress: Other (comment) Pt will Go Up / Down Stairs: Flight;with supervision;with least restrictive assistive device;with cues (comment type and amount) (pt ?'s if she can "bump" up/dwn stairs on her bottom as PTA) PT Goal: Up/Down Stairs - Progress: Other (comment) Additional Goals Additional Goal #1: Pt will verbalize and adhere to back precautions during functional activities. PT Goal: Additional Goal #1 - Progress: Other (comment)  PT Evaluation Precautions/Restrictions  Precautions Precautions: Back Precaution Booklet Issued: No (none present on unit) Precaution Comments: reviewed back precautions with pt able to recall 2/3 without cues and 3/3 with minor cue Required Braces or Orthoses: Yes Spinal Brace: Lumbar corset;Applied in supine position;Other (comment) Spinal Brace Comments: pt informed that she can don/doff her brace in sitting, yet she prefers supine; discussed difficulty donning brace I'ly in supine and pt attempted to demonstrate her technique--she did not twist but did need assist to pull brace through; she prefers to tighten brace in supine, so we discussed sitting up to don brace, then lying down to tighten and doffing brace in sitting before lying down--pt agreeable to attempt next visit Restrictions Weight  Bearing Restrictions: No Other Position/Activity Restrictions: Pt  with pain in Rt iliac/hip with weight-bearing (due to iliac bone graft removed) Prior Functioning  Home Living Lives With: Significant other (he works 12 hr shifts (7p-7a); ) Receives Help From: Other (Comment) (unsure if neighbors can help with dog) Type of Home: Apartment Home Layout: Two level;1/2 bath on main level;Bed/bath upstairs Alternate Level Stairs-Rails: Left Alternate Level Stairs-Number of Steps: 12 Home Access: Level entry Bathroom Shower/Tub: Tub/shower unit;Curtain Bathroom Accessibility: Yes How Accessible: Accessible via walker Home Adaptive Equipment: Hand-held shower hose Additional Comments: Discussed that she should not be squatting to floor to feed or put leash on her dog for a few weeks; discussed possibly having dog stay with a friend for a while Prior Function Level of Independence: Independent with basic ADLs;Independent with gait;Other (comment) (poor stamina; numbness in RLE; used electric cart at stores) Driving: Yes Vocation: Workers comp Comments: has a Nurse, 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; she would have to squat to floor to put leash on Cognition Cognition Arousal/Alertness: Awake/alert Overall Cognitive Status: Appears within functional limits for tasks assessed Orientation Level: Oriented to person;Oriented to place;Oriented to situation;Disoriented to time Cognition - Other Comments: Thought it was Saturday (today is Friday) repeatedly despite corrections throughout session Sensation/Coordination   Extremity Assessment RLE Assessment RLE Assessment: Within Functional Limits LLE Assessment LLE Assessment: Within Functional Limits Mobility (including Balance) Bed Mobility Bed Mobility: Yes Rolling Right: 5: Supervision;With rail Rolling Right Details (indicate cue type and reason): vc for bending knees prior to rolling Rolling Left: 5: Supervision;With rail Rolling Left Details (indicate cue type and reason): vc for bending knees  prior to rolling Left Sidelying to Sit: 5: Supervision;With rails;HOB elevated (comment degrees) Left Sidelying to Sit Details (indicate cue type and reason): pt placed legs over EOB and then used buttons to raise HOB to 25 degrees; discussed she will need to practice bed mobility without use of rails/elevated HOB prior to d/c home Sit to Supine - Left: 5: Supervision;HOB flat Sit to Supine - Left Details (indicate cue type and reason): vc for technique to maintain back precautions Transfers Transfers: Yes Sit to Stand: 5: Supervision;With upper extremity assist;From bed;From chair/3-in-1 Sit to Stand Details (indicate cue type and reason): denied dizziness; cues for use of RW Stand to Sit: 5: Supervision;With upper extremity assist;To bed;To chair/3-in-1 Stand to Sit Details: cues for use of RW Ambulation/Gait Ambulation/Gait: Yes Ambulation/Gait Assistance: 4: Min assist Ambulation/Gait Assistance Details (indicate cue type and reason): min-guard assist progressing to Supervision; cues for upright posture and to not look down at feet/floor Ambulation Distance (Feet): 200 Feet Assistive device: Rolling walker Gait Pattern: Step-to pattern;Decreased step length - left;Antalgic (leading with RLE due to iliac crest pain)  Posture/Postural Control Posture/Postural Control: No significant limitations Exercise    End of Session PT - End of Session Equipment Utilized During Treatment: Back brace Activity Tolerance: Patient tolerated treatment well Patient left: in bed;with call bell in reach Nurse Communication: Mobility status for ambulation General Behavior During Session: [facility] for tasks performed (very talkative)  Aniyia Rane 11/27/2011, 3:05 PM Pager [phone]

## 2011-11-27 NOTE — Addendum Note (Signed)
Addendum  created 11/27/11 1343 by Judie Petit, MD   Modules edited:Notes Section

## 2011-11-28 MED ORDER — POLYETHYLENE GLYCOL 3350 17 G PO PACK
17.0000 g | PACK | Freq: Every day | ORAL | Status: DC
  Administered 2011-11-28 – 2011-11-29 (×3): 17 g via ORAL
  Filled 2011-11-28 (×3): qty 1

## 2011-11-28 NOTE — Progress Notes (Signed)
Physical Therapy Treatment Patient Details Name: Katelyn Galloway MRN: [id number] DOB: 07-Apr-1961 Today's Date: 11/28/2011  PT Assessment/Plan  PT - Assessment/Plan Comments on Treatment Session: Pt did well today, however moving very slow.  PT Plan: Discharge plan remains appropriate PT Frequency: Min 5X/week Follow Up Recommendations: Home health PT Equipment Recommended: Rolling walker with 5" wheels PT Goals  Acute Rehab PT Goals PT Goal Formulation: With patient Time For Goal Achievement: 7 days Pt will Roll Supine to Left Side: with modified independence PT Goal: Rolling Supine to Left Side - Progress:  (not assessed) Pt will go Supine/Side to Sit: with modified independence;with HOB 0 degrees PT Goal: Supine/Side to Sit - Progress:  (not assessed) Pt will go Sit to Supine/Side: with modified independence;with HOB 0 degrees PT Goal: Sit to Supine/Side - Progress:  (not assessed) Pt will go Sit to Stand: with modified independence (not assessed) Pt will go Stand to Sit: with modified independence PT Goal: Stand to Sit - Progress: Progressing toward goal Pt will Ambulate: >150 feet;with modified independence;with least restrictive assistive device PT Goal: Ambulate - Progress: Progressing toward goal Pt will Go Up / Down Stairs: Flight;with supervision;with least restrictive assistive device;with cues (comment type and amount) (pt ?'s if she can "bump" up/dwn stairs on her bottom as PTA) PT Goal: Up/Down Stairs - Progress: Progressing toward goal Additional Goals Additional Goal #1: Pt will verbalize and adhere to back precautions during functional activities. PT Goal: Additional Goal #1 - Progress: Progressing toward goal  PT Treatment Precautions/Restrictions  Precautions Precautions: Back Precaution Booklet Issued: No (none present on unit) Precaution Comments: Handout provided. Verbally reviewed 2 x. 1st attempt pt recalled 2/3, second 3/3 Required Braces or Orthoses:  Yes Spinal Brace: Lumbar corset Spinal Brace Comments: pt informed that she can don/doff her brace in sitting, yet she prefers supine; discussed difficulty donning brace I'ly in supine and pt attempted to demonstrate her technique--she did not twist but did need assist to pull brace through; she prefers to tighten brace in supine, so we discussed sitting up to don brace, then lying down to tighten and doffing brace in sitting before lying down--pt agreeable to attempt next visit Restrictions Weight Bearing Restrictions: No Other Position/Activity Restrictions: Pt with pain in Rt iliac/hip with weight-bearing (due to iliac bone graft removed) Mobility (including Balance) Bed Mobility Bed Mobility: Yes Rolling Left: 6: Modified independent (Device/Increase time);With rail Left Sidelying to Sit: 6: Modified independent (Device/Increase time);With rails;HOB elevated (comment degrees) (30%) Sitting - Scoot to Edge of Bed: 7: Independent Transfers Transfers: Yes (pt standing with OT at start of treatment) Sit to Stand: 6: Modified independent (Device/Increase time);With upper extremity assist;From bed;Other (comment) (increased time) Stand to Sit: 5: Supervision;With upper extremity assist;To chair/3-in-1 Stand to Sit Details: Verbal cues for maintaining precautions Ambulation/Gait Ambulation/Gait: Yes Ambulation/Gait Assistance: 4: Min assist;5: Supervision Ambulation/Gait Assistance Details (indicate cue type and reason): Supervision for gait, min-assist for tactile cues and manual facilitation to Rt. quad to promote heel strike and knee extension.  Ambulation Distance (Feet): 150 Feet Assistive device: Rolling walker Gait Pattern: Decreased step length - left;Antalgic;Step-through pattern;Decreased stance time - right (leading with RLE due to iliac crest pain) Stairs: Yes Stairs Assistance: 4: Min assist Stairs Assistance Details (indicate cue type and reason): Min assist progressing to  supervision. Verbal cues for sequencing and precautions.  Stair Management Technique: One rail Right;One rail Left;Step to pattern;Forwards Number of Stairs: 15  (2-3 at a time)  Posture/Postural Control Posture/Postural Control: No significant  limitations End of Session PT - End of Session Equipment Utilized During Treatment: Back brace Activity Tolerance: Patient tolerated treatment well Patient left: with call bell in reach;in chair General Behavior During Session: [facility] for tasks performed (very talkative) Cognition: [facility] for tasks performed  Sherie Don 11/28/2011, 1:44 PM  Dahlia Client (Beverely Pace) Carleene Mains PT, DPT Acute Rehabilitation [phone]

## 2011-11-28 NOTE — Progress Notes (Signed)
Subjective: Patient reports Abdominal discomfort and back pain in addition to headache.  Objective: Vital signs in last 24 hours: Temp:  [98 F (36.7 C)-98.7 F (37.1 C)] 98.2 F (36.8 C) (12/08 0600) Pulse Rate:  [74-89] 74  (12/08 0600) Resp:  [18] 18  (12/08 0600) BP: (84-110)/(44-60) 90/52 mmHg (12/08 0600) SpO2:  [96 %-98 %] 96 % (12/08 0600)  Intake/Output from previous day: 12/07 0701 - 12/08 0700 In: 1290 [P.O.:1220] Out: 690 [Urine:600; Drains:90] Intake/Output this shift:    Alert oriented abdomen soft no discrete tenderness back incision intact with drain in place. Motor function good in lower extremities.  Lab Results: No results found for this basename: WBC:2,HGB:2,HCT:2,PLT:2 in the last 72 hours BMET No results found for this basename: NA:2,K:2,CL:2,CO2:2,GLUCOSE:2,BUN:2,CREATININE:2,CALCIUM:2 in the last 72 hours  Studies/Results: Dg Lumbar Spine 2-3 Views  11/26/2011  *RADIOLOGY REPORT*  Clinical Data: Back pain.  LUMBAR SPINE - 2-3 VIEW  Comparison: Plain films 09/21/2011.  Findings: There has been revision of the previous L4-5 fusion. Interspinous fixation device has been removed and replaced with bilateral L4-L5 pedicle screws.  Interbody spacer remains.  IMPRESSION: As above.  Original Report Authenticated By: Elsie Stain, M.D.    Assessment/Plan: Postop day 2 for supplemental stabilization of lumbar spine. Headache and abdominal discomfort noted. Patient takes MiraLAX regularly and would like to take double dose today.  LOS: 2 days  Encourage mobilization as tolerated.   Shawnda Mauney J 11/28/2011, 9:26 AM

## 2011-11-28 NOTE — Progress Notes (Signed)
Occupational Therapy Evaluation Patient Details Name: Katelyn Galloway MRN: [id number] DOB: 1961/07/11 Today's Date: 11/28/2011 11:23-11:57  Problem List: There is no problem list on file for this patient.   Past Medical History:  Past Medical History  Diagnosis Date  . Anxiety   . Depression   . Anemia 1970  . Seizures     x 2 during pregnancy per pt was not diagnosed with eclampsia  . Headache   . Kidney stones   . Irritable bowel syndrome (IBS)    Past Surgical History:  Past Surgical History  Procedure Date  . Abdominal hysterectomy 1992  . Knee arthroscopy     right knee  . Tubal ligation   . Cholecystectomy 2009 or 2010  . Kindey stone removal   . Back surgery     OT Assessment/Plan/Recommendation OT Assessment Clinical Impression Statement: This 50 yo female s/p back surgery presents with deficits below thus affecting pts's PLOF at I with BADLs/IADLs. Will benefit from acute OT to further educate on AE for pt to be Mod I with LB B/D. OT Recommendation/Assessment: Patient will need skilled OT in the acute care venue OT Problem List: Decreased knowledge of use of DME or AE;Pain OT Therapy Diagnosis : Generalized weakness;Acute pain OT Plan OT Frequency: Min 2X/week OT Treatment/Interventions: Self-care/ADL training;DME and/or AE instruction;Patient/family education OT Recommendation Follow Up Recommendations: None Equipment Recommended: 3 in 1 bedside comode;Other (comment) (tub seat with back ([facility]); AE--reacher, sock aide, long shoe horn, long sponge Individuals Consulted Consulted and Agree with Results and Recommendations: Patient OT Goals Acute Rehab OT Goals OT Goal Formulation: With patient Time For Goal Achievement: 2 days ADL Goals Additional ADL Goal #1: Pt will be I with AE for LB ADLs. ADL Goal: Additional Goal #1 - Progress: Progressing toward goals  OT Evaluation Precautions/Restrictions  Precautions Precautions:  Back Precaution Booklet Issued: No (none present on unit) Precaution Comments: reviewed back precautions with pt able to recall 2/3 without cues and 3/3 with minor cue Required Braces or Orthoses: Yes Spinal Brace: Lumbar corset Spinal Brace Comments: pt informed that she can don/doff her brace in sitting, yet she prefers supine; discussed difficulty donning brace I'ly in supine and pt attempted to demonstrate her technique--she did not twist but did need assist to pull brace through; she prefers to tighten brace in supine, so we discussed sitting up to don brace, then lying down to tighten and doffing brace in sitting before lying down--pt agreeable to attempt next visit Restrictions Weight Bearing Restrictions: No Other Position/Activity Restrictions: Pt with pain in Rt iliac/hip with weight-bearing (due to iliac bone graft removed) Prior Functioning Home Living Lives With: Significant other Type of Home: Apartment Home Layout: Two level;1/2 bath on main level;Bed/bath upstairs Alternate Level Stairs-Rails: Left Alternate Level Stairs-Number of Steps: 12 Home Access: Level entry Bathroom Shower/Tub: Tub/shower unit;Curtain Bathroom Toilet: Standard Bathroom Accessibility: Yes How Accessible: Accessible via walker Home Adaptive Equipment: Hand-held shower hose Prior Function Level of Independence: Independent with basic ADLs;Independent with gait;Independent with transfers Driving: Yes Vocation: Workers comp ADL ADL Eating/Feeding: Simulated;Independent Where Assessed - Eating/Feeding: Edge of bed Grooming: Performed;Wash/dry hands;Independent Where Assessed - Grooming: Standing at sink Upper Body Bathing: Simulated;Set up Upper Body Bathing Details (indicate cue type and reason): cannot safely gather her own items at this time Where Assessed - Upper Body Bathing: Unsupported;Sitting, bed Lower Body Bathing: Simulated;Moderate assistance Lower Body Bathing Details (indicate cue  type and reason): cannot safely gather her own items and back  precautions hindering I. Where Assessed - Lower Body Bathing: Sit to stand from bed;Unsupported Upper Body Dressing: Simulated;Set up Upper Body Dressing Details (indicate cue type and reason): cannot safely gather her own items at this time Where Assessed - Upper Body Dressing: Sitting, bed;Unsupported Lower Body Dressing: Simulated;Maximal assistance Lower Body Dressing Details (indicate cue type and reason): cannot safely gather her own items and back precautions hindering I. Where Assessed - Lower Body Dressing: Sit to stand from bed;Unsupported Toilet Transfer: Performed;Modified independent Toilet Transfer Method: Proofreader: Regular height toilet;Grab bars Toileting - Clothing Manipulation: Performed;Independent Where Assessed - Toileting Clothing Manipulation: Standing Toileting - Hygiene: Performed;Independent Where Assessed - Toileting Hygiene: Sit to stand from 3-in-1 or toilet Tub/Shower Transfer: Performed;Minimal assistance Tub/Shower Transfer Details (indicate cue type and reason): for balance Tub/Shower Transfer Method: Ambulating;Other (comment) (side step and then back up to seat) Tub/Shower Transfer Equipment: Counsellor Used: Rolling walker Vision/Perception    Cognition Cognition Arousal/Alertness: Awake/alert Overall Cognitive Status: Appears within functional limits for tasks assessed Orientation Level: Oriented X4 Sensation/Coordination   Extremity Assessment   Mobility  Bed Mobility Bed Mobility: Yes Rolling Left: 6: Modified independent (Device/Increase time);With rail Left Sidelying to Sit: 6: Modified independent (Device/Increase time);With rails;HOB elevated (comment degrees) (30%) Sitting - Scoot to Edge of Bed: 7: Independent Transfers Transfers: Yes Sit to Stand: 6: Modified independent (Device/Increase time);With upper extremity assist;From  bed;Other (comment) (increased time) Exercises   End of Session OT - End of Session Equipment Utilized During Treatment: Back brace;Other (comment) (RW, tub bench used as a seat) Activity Tolerance: Patient tolerated treatment well Patient left: Other (comment) (with PT) General Behavior During Session: [facility] for tasks performed Cognition: [facility] for tasks performed   Evette Georges [phone] 11/28/2011, 12:36 PM

## 2011-11-29 MED ORDER — HYDROXYZINE HCL 50 MG PO TABS
50.0000 mg | ORAL_TABLET | Freq: Two times a day (BID) | ORAL | Status: DC
  Administered 2011-11-29 – 2011-11-30 (×3): 50 mg via ORAL
  Filled 2011-11-29 (×4): qty 1

## 2011-11-29 NOTE — Progress Notes (Signed)
Patient ID: Katelyn Galloway, female   DOB: [birth year], 50 y.o.   MRN: [id number] Subjective: Patient reports Complains of pain!!  Objective: Vital signs in last 24 hours: Temp:  [97.3 F (36.3 C)-98.9 F (37.2 C)] 97.3 F (36.3 C) (12/09 0700) Pulse Rate:  [80-89] 80  (12/09 0700) Resp:  [16-18] 18  (12/09 0700) BP: (88-110)/(54-72) 109/58 mmHg (12/09 0700) SpO2:  [95 %-98 %] 96 % (12/09 0700)  Intake/Output from previous day:   Intake/Output this shift:    No new issues  Lab Results: No results found for this basename: WBC:2,HGB:2,HCT:2,PLT:2 in the last 72 hours BMET No results found for this basename: NA:2,K:2,CL:2,CO2:2,GLUCOSE:2,BUN:2,CREATININE:2,CALCIUM:2 in the last 72 hours  Studies/Results: No results found.  Assessment/Plan: Doing fair. More pain complaints than usual. Will try to get better pain control.  LOS: 3 days  As above   Reinaldo Meeker, MD 11/29/2011, 9:59 AM

## 2011-11-29 NOTE — Progress Notes (Signed)
Physical Therapy Treatment Patient Details Name: Katelyn Galloway MRN: [id number] DOB: Jan 10, 1961 Today's Date: 11/29/2011  PT Assessment/Plan  PT - Assessment/Plan Comments on Treatment Session: needs increased time with mobility and redirection to task as pt is talkative and easily distracted. PT Plan: Discharge plan remains appropriate PT Frequency: Min 5X/week Follow Up Recommendations: Home health PT Equipment Recommended: Rolling walker with 5" wheels PT Goals  Acute Rehab PT Goals PT Goal: Rolling Supine to Left Side - Progress: Progressing toward goal PT Goal: Supine/Side to Sit - Progress: Progressing toward goal PT Goal: Sit to Stand - Progress: Progressing toward goal PT Goal: Stand to Sit - Progress: Progressing toward goal PT Goal: Ambulate - Progress: Progressing toward goal Additional Goals PT Goal: Additional Goal #1 - Progress: Progressing toward goal  PT Treatment Precautions/Restrictions  Precautions Precautions: Back Precaution Booklet Issued: No Precaution Comments: pt able to recall 3/3 precautions without cues, does need cues to consistently demo with mobility Required Braces or Orthoses: Yes Spinal Brace: Lumbar corset;Applied in sitting position (pt donned corset without assistance/cues at edge of bed) Spinal Brace Comments: pt donned brace in seated position today without cues/assist and tightned brace in seated positon as well. Restrictions Weight Bearing Restrictions: No Mobility (including Balance) Bed Mobility Rolling Right: Not tested (comment) Rolling Left: 6: Modified independent (Device/Increase time);With rail Rolling Left Details (indicate cue type and reason): cues to bend both knees before rolling Left Sidelying to Sit: 6: Modified independent (Device/Increase time);HOB elevated (comment degrees) (HOB elevated approx 45 degrees) Left Sidelying to Sit Details (indicate cue type and reason): pt rolled onto side and then began elevating head  of bed. discussed practicing with the head of the bed lower and agreed to try at a later time, not today due to too much pain in her back. Sitting - Scoot to Edge of Bed: 7: Independent Transfers Sit to Stand: 5: Supervision;With upper extremity assist;From bed Sit to Stand Details (indicate cue type and reason): cues for safe hand placement with standing up Stand to Sit: 5: Supervision;With upper extremity assist;To chair/3-in-1 Stand to Sit Details: cues to reach back for safety with sitting down and not to twist while doing so. Ambulation/Gait Ambulation/Gait: Yes Ambulation/Gait Assistance: 5: Supervision Ambulation/Gait Assistance Details (indicate cue type and reason): cues to stand up tall and not to lean on arms on RW so much with gait. Ambulation Distance (Feet): 200 Feet Assistive device: Rolling walker Gait Pattern: Step-through pattern;Decreased stride length Stairs: No  Posture/Postural Control Posture/Postural Control: No significant limitations Exercise    End of Session PT - End of Session Equipment Utilized During Treatment: Gait belt;Back brace Activity Tolerance: Patient tolerated treatment well Patient left: in chair Nurse Communication: Mobility status for transfers;Mobility status for ambulation General Behavior During Session: [facility] for tasks performed (verbilized thoughts of depressive nature, rn notified/aware) Cognition: WFL for tasks performed  Sallyanne Kuster 11/29/2011, 2:26 PM  Sallyanne Kuster, PTA Office- [phone]

## 2011-11-30 MED ORDER — OXYCODONE-ACETAMINOPHEN 5-325 MG PO TABS: 1.0000 | ORAL_TABLET | ORAL | Status: AC | PRN

## 2011-11-30 NOTE — Progress Notes (Signed)
CARE MANAGEMENT NOTE 11/30/2011 13:30   Received call from [facility]. [facility] will get DME arranged. All equipment will be delivered to the patient's home.  11/30/11 12:05 Vance Peper, RN BSN Case Manager [phone] patient will need a tub seat with a back, 3in1, rolling walker, reacher,long handled sponge, sock aide and shoe horn. Spoke with Arline Asp the UR nurse- [facility] will contact [facility]. Will fax orders for DME to her @ [phone].

## 2011-11-30 NOTE — Progress Notes (Signed)
Occupational Therapy Treatment Patient Details Name: Katelyn Galloway MRN: [id number] DOB: 1961/11/29 Today's Date: 11/30/2011 10:11-10:45  OT Assessment/Plan OT Assessment/Plan Comments on Treatment Session: All education completed and DME/AE needs made known to the proper people. Acute OT will sign off. OT Plan: Discharge plan remains appropriate Follow Up Recommendations: None Equipment Recommended: 3 in 1 bedside comode;Tub/shower seat;Other (comment) (tub seat--recommend [employer] ([facility] has), reacher, sock aid, long handled shoe horn, long handled sponge OT Goals ADL Goals ADL Goal: Additional Goal #1 - Progress: Other (comment) (actually mod I with AE use)  OT Treatment Precautions/Restrictions  Precautions Precautions: Back Precaution Comments: Independently verbalized 3/3 back precautions  Required Braces or Orthoses: Yes Spinal Brace: Lumbar corset Spinal Brace Comments: Pt up in room with brace already on Restrictions Weight Bearing Restrictions: No   ADL ADL Eating/Feeding: Not assessed Grooming: Not assessed Upper Body Bathing: Not assessed Lower Body Bathing: Not assessed Upper Body Dressing: Not assessed Lower Body Dressing: Performed;Modified independent Lower Body Dressing Details (indicate cue type and reason): Pt at RW level can safely gather her own items with reacher. Went over how she would do LBD with AE using reacher and sockaide; as well as showing her the other pieces of equipment (long shoe horn and long sponge)--these 4 pieces are recommended for pt to have at home  Toilet Transfer: Not assessed Toilet Transfer Method: Not assessed Toileting - Clothing Manipulation: Not assessed Where Assessed - Toileting Clothing Manipulation: Not assessed Toileting - Hygiene: Not assessed Where Assessed - Toileting Hygiene: Not assessed Tub/Shower Transfer: Not assessed Tub/Shower Transfer Method: Not assessed Equipment Used: Rolling  walker;Reacher;Sock aid;Other (comment) (back brace) ADL Comments: Gave info to Dr. Yetta Barre about DME/AE needed, talked to pt's workman's comp worker about equipment needs, progression nurse also aware Mobility  Bed Mobility Bed Mobility: No Sit to Supine - Left: 5: Supervision Sit to Supine - Left Details (indicate cue type and reason): cues for sequencing, technique, reinforcement of back precautions.   Transfers Transfers: No Sit to Stand: 5: Supervision Sit to Stand Details (indicate cue type and reason): cues for safe hand placement Stand to Sit: 5: Supervision Stand to Sit Details: cues for safe hand placement Exercises    End of Session OT - End of Session Equipment Utilized During Treatment: Back brace;Other (comment) (reacher, sock aid) Activity Tolerance: Patient tolerated treatment well Patient left: Other (comment) (standing up in room) General Behavior During Session: [facility] for tasks performed Cognition: [facility] for tasks performed  Evette Georges [phone] 11/30/2011, 3:07 PM

## 2011-11-30 NOTE — Progress Notes (Signed)
Pt. Discharge instructions provided. Pt verbalized understanding. Pt i/v dc intact. Pt home equipment will be delivered to home. Pt under no s/s distress.

## 2011-11-30 NOTE — Discharge Summary (Signed)
Physician Discharge Summary  Patient ID: Katelyn Galloway MRN: [id number] DOB/AGE: [birth year] 49 y.o.  Admit date: 11/26/2011 Discharge date: 11/30/2011  Admission Diagnoses: pseudoarthrosis L4-5    Discharge Diagnoses: same   Discharged Condition: stable  Hospital Course: The patient was admitted on 11/26/2011 and taken to the operating room where the patient underwent re-do fusion L4-5. The patient tolerated the procedure well and was taken to the recovery room and then to the floor in stable condition. The hospital course was routine. There were no complications. The wound remained clean dry and intact. The patient remained afebrile with stable vital signs, and tolerated a regular diet. The patient continued to increase activities, and pain was well controlled with oral pain medications.   Consults: none  Significant Diagnostic Studies:  Results for orders placed during the hospital encounter of 11/26/11  GLUCOSE, CAPILLARY      Component Value Range   Glucose-Capillary 126 (*) 70 - 99 (mg/dL)    Chest 2 View  30/07/6577  *RADIOLOGY REPORT*  Clinical Data: Preoperative respiratory evaluation prior to lumbar fusion.  CHEST - 2 VIEW 11/24/2011:  Comparison: Two-view chest x-ray 04/21/2010 [facility].  Findings: Cardiomediastinal silhouette unremarkable.  Lungs clear. Bronchovascular markings normal.  Pulmonary vascularity normal.  No pleural effusions.  No pneumothorax.  Visualized bony thorax intact.  No significant interval change.  IMPRESSION: Normal and stable examination.  Original Report Authenticated By: Arnell Sieving, M.D.   Dg Lumbar Spine 2-3 Views  11/26/2011  *RADIOLOGY REPORT*  Clinical Data: Back pain.  LUMBAR SPINE - 2-3 VIEW  Comparison: Plain films 09/21/2011.  Findings: There has been revision of the previous L4-5 fusion. Interspinous fixation device has been removed and replaced with bilateral L4-L5 pedicle screws.  Interbody spacer remains.   IMPRESSION: As above.  Original Report Authenticated By: Elsie Stain, M.D.    Antibiotics:  Anti-infectives     Start     Dose/Rate Route Frequency Ordered Stop   11/26/11 2200   ceFAZolin (ANCEF) IVPB 1 g/50 mL premix        1 g 100 mL/hr over 30 Minutes Intravenous 3 times per day 11/26/11 2136 11/27/11 0551   11/26/11 1445   bacitracin 50,000 Units in sodium chloride irrigation 0.9 % 500 mL irrigation  Status:  Discontinued          As needed 11/26/11 1445 11/26/11 1656   11/25/11 1445   ceFAZolin (ANCEF) IVPB 1 g/50 mL premix        1 g 100 mL/hr over 30 Minutes Intravenous 60 min pre-op 11/25/11 1440 11/26/11 1406          Discharge Exam: Blood pressure 111/75, pulse 86, temperature 98.5 F (36.9 C), temperature source Oral, resp. rate 18, height 5\' 5"  (1.651 m), weight 58 kg (127 lb 13.9 oz), SpO2 97.00%. Neurologic: Grossly normal  Discharge Medications:   Current Discharge Medication List    CONTINUE these medications which have NOT CHANGED   Details  B Complex-C (B-COMPLEX WITH VITAMIN C) tablet Take 1 tablet by mouth every morning.      buPROPion (WELLBUTRIN XL) 150 MG 24 hr tablet Take 150 mg by mouth at bedtime.      carisoprodol (SOMA) 350 MG tablet Take 350 mg by mouth 3 (three) times daily.      cholecalciferol (VITAMIN D) 1000 UNITS tablet Take 2,000 Units by mouth at bedtime.      Cranberry 425 MG CAPS Take 1 capsule by mouth 2 (two) times  daily.      diazepam (VALIUM) 5 MG tablet Take 5 mg by mouth 2 (two) times daily.      hydrOXYzine (VISTARIL) 50 MG capsule Take 50 mg by mouth 2 (two) times daily.      lamoTRIgine (LAMICTAL) 25 MG tablet Take 100 mg by mouth at bedtime.      meloxicam (MOBIC) 7.5 MG tablet Take 7.5 mg by mouth 2 (two) times daily.      morphine (MSIR) 30 MG tablet Take 30 mg by mouth 3 (three) times daily.      Nutritional Supplements (ESTROVEN PO) Take 1 tablet by mouth daily.      OVER THE COUNTER MEDICATION Take 1  capsule by mouth 2 (two) times daily. Magnesium & Zinc    polyethylene glycol (MIRALAX / GLYCOLAX) packet Take 17 g by mouth daily.      dicyclomine (BENTYL) 20 MG tablet Take 20 mg by mouth 4 (four) times daily as needed. For irritable bowel syndrome         Disposition: home   Final Dx: re-do fusion L4-5    Follow-up Information    Follow up with Brewer Hitchman S in 2 weeks.   Contact information:   [address] [place] [address] [phone]           Signed: Tia Alert 11/30/2011, 9:55 AM

## 2011-11-30 NOTE — Progress Notes (Signed)
Physical Therapy Treatment Patient Details Name: Katelyn Galloway MRN: [id number] DOB: 1961-03-15 Today's Date: 11/30/2011  PT Assessment/Plan  PT - Assessment/Plan Comments on Treatment Session: Pt very eager to D/C home today.   PT Plan: Discharge plan remains appropriate PT Frequency: Min 5X/week Follow Up Recommendations: Home health PT Equipment Recommended: Rolling walker with 5" wheels PT Goals  Acute Rehab PT Goals PT Goal: Sit to Supine/Side - Progress: Progressing toward goal PT Goal: Sit to Stand - Progress: Progressing toward goal PT Goal: Stand to Sit - Progress: Progressing toward goal PT Goal: Ambulate - Progress: Progressing toward goal PT Goal: Up/Down Stairs - Progress: Progressing toward goal  PT Treatment Precautions/Restrictions  Precautions Precautions: Back Precaution Booklet Issued: No Precaution Comments: Independently verbalized 3/3 back precautions  Required Braces or Orthoses: Yes Spinal Brace: Lumbar corset;Applied in sitting position Spinal Brace Comments: pt donned brace in seated position today without cues/assist and tightned brace in seated positon as well. Restrictions Weight Bearing Restrictions: No Other Position/Activity Restrictions: Pt with pain in Rt iliac/hip with weight-bearing (due to iliac bone graft removed) Mobility (including Balance) Bed Mobility Sit to Supine - Left: 5: Supervision Sit to Supine - Left Details (indicate cue type and reason): cues for sequencing, technique, reinforcement of back precautions.   Transfers Sit to Stand: 5: Supervision Sit to Stand Details (indicate cue type and reason): cues for safe hand placement Stand to Sit: 5: Supervision Stand to Sit Details: cues for safe hand placement Ambulation/Gait Ambulation/Gait Assistance: 5: Supervision Ambulation/Gait Assistance Details (indicate cue type and reason): cues to decrease reliance of UE's on RW, encouragement to increase gait speed, cues to increase  step/stride length.  Ambulation Distance (Feet): 150 Feet (2x's) Assistive device: Rolling walker Gait Pattern: Step-through pattern;Decreased stride length;Decreased step length - right;Decreased step length - left Stairs: Yes Stairs Assistance: 5: Supervision Stairs Assistance Details (indicate cue type and reason): cues for sequencing.  Encouraged step to pattern to increase safety but pt states alternating pattern feels better.   Stair Management Technique: One rail Right;Alternating pattern;Forwards Number of Stairs: 5  (2x's. )    Exercise    End of Session PT - End of Session Equipment Utilized During Treatment: Gait belt Activity Tolerance: Patient tolerated treatment well Patient left: in bed General Behavior During Session: [facility] for tasks performed Cognition: [facility] for tasks performed  Lara Mulch 11/30/2011, 12:49 PM [phone]

## 2012-01-05 ENCOUNTER — Ambulatory Visit
Admission: RE | Admit: 2012-01-05 | Discharge: 2012-01-05 | Disposition: A | Discharge status: Home / Self Care | Payer: Worker's Compensation | Source: Ambulatory Visit | Attending: Neurological Surgery | Admitting: Neurological Surgery

## 2012-01-05 ENCOUNTER — Other Ambulatory Visit: Payer: Self-pay | Admitting: Neurological Surgery

## 2012-01-05 DIAGNOSIS — M545 Low back pain: Secondary | ICD-10-CM

## 2012-01-05 DIAGNOSIS — M961 Postlaminectomy syndrome, not elsewhere classified: Secondary | ICD-10-CM

## 2012-12-23 ENCOUNTER — Other Ambulatory Visit: Payer: Self-pay | Admitting: Neurological Surgery

## 2012-12-27 ENCOUNTER — Encounter (HOSPITAL_COMMUNITY): Payer: Self-pay

## 2012-12-28 ENCOUNTER — Encounter (HOSPITAL_COMMUNITY): Payer: Self-pay

## 2012-12-28 MED ORDER — DEXAMETHASONE SODIUM PHOSPHATE 10 MG/ML IJ SOLN
10.0000 mg | INTRAMUSCULAR | Status: AC
  Administered 2012-12-29: 10 mg via INTRAVENOUS
  Filled 2012-12-28: qty 1

## 2012-12-28 MED ORDER — CEFAZOLIN SODIUM-DEXTROSE 2-3 GM-% IV SOLR
2.0000 g | INTRAVENOUS | Status: AC
  Administered 2012-12-29: 2 g via INTRAVENOUS
  Filled 2012-12-28: qty 50

## 2012-12-29 ENCOUNTER — Encounter (HOSPITAL_COMMUNITY): Payer: Self-pay | Admitting: Certified Registered"

## 2012-12-29 ENCOUNTER — Ambulatory Visit (HOSPITAL_COMMUNITY): Payer: Worker's Compensation | Admitting: Certified Registered"

## 2012-12-29 ENCOUNTER — Observation Stay (HOSPITAL_COMMUNITY)
Admission: RE | Admit: 2012-12-29 | Discharge: 2012-12-30 | Disposition: A | Discharge status: Home / Self Care | Payer: Worker's Compensation | Source: Ambulatory Visit | Attending: Neurological Surgery | Admitting: Neurological Surgery

## 2012-12-29 ENCOUNTER — Encounter (HOSPITAL_COMMUNITY)
Admission: RE | Disposition: A | Discharge status: Home / Self Care | Payer: Self-pay | Source: Ambulatory Visit | Attending: Neurological Surgery

## 2012-12-29 ENCOUNTER — Encounter (HOSPITAL_COMMUNITY): Payer: Self-pay | Admitting: Neurological Surgery

## 2012-12-29 DIAGNOSIS — T8489XA Other specified complication of internal orthopedic prosthetic devices, implants and grafts, initial encounter: Principal | ICD-10-CM | POA: Insufficient documentation

## 2012-12-29 DIAGNOSIS — F17200 Nicotine dependence, unspecified, uncomplicated: Secondary | ICD-10-CM | POA: Insufficient documentation

## 2012-12-29 DIAGNOSIS — Z981 Arthrodesis status: Secondary | ICD-10-CM | POA: Insufficient documentation

## 2012-12-29 DIAGNOSIS — Y832 Surgical operation with anastomosis, bypass or graft as the cause of abnormal reaction of the patient, or of later complication, without mention of misadventure at the time of the procedure: Secondary | ICD-10-CM | POA: Insufficient documentation

## 2012-12-29 DIAGNOSIS — Z9889 Other specified postprocedural states: Secondary | ICD-10-CM

## 2012-12-29 HISTORY — PX: HARDWARE REMOVAL: SHX979

## 2012-12-29 HISTORY — DX: Dorsalgia, unspecified: M54.9

## 2012-12-29 HISTORY — DX: Other chronic pain: G89.29

## 2012-12-29 HISTORY — DX: Bronchitis, not specified as acute or chronic: J40

## 2012-12-29 LAB — BASIC METABOLIC PANEL
Calcium: 9.4 mg/dL (ref 8.4–10.5)
GFR calc non Af Amer: 79 mL/min — ABNORMAL LOW (ref >90)
Sodium: 143 mEq/L (ref 135–145)

## 2012-12-29 LAB — CBC
MCH: 30 pg (ref 26.0–34.0)
MCHC: 32.8 g/dL (ref 30.0–36.0)
Platelets: 247 10*3/uL (ref 150–400)
RBC: 4.4 MIL/uL (ref 3.87–5.11)

## 2012-12-29 LAB — SURGICAL PCR SCREEN: MRSA, PCR: NEGATIVE

## 2012-12-29 SURGERY — REMOVAL, HARDWARE
Anesthesia: General | Site: Back | Wound class: Clean

## 2012-12-29 MED ORDER — BUPIVACAINE HCL (PF) 0.25 % IJ SOLN
INTRAMUSCULAR | Status: DC | PRN
  Administered 2012-12-29: 10 mL

## 2012-12-29 MED ORDER — CEFAZOLIN SODIUM 1-5 GM-% IV SOLN
1.0000 g | Freq: Three times a day (TID) | INTRAVENOUS | Status: AC
  Administered 2012-12-29 – 2012-12-30 (×2): 1 g via INTRAVENOUS
  Filled 2012-12-29 (×3): qty 50

## 2012-12-29 MED ORDER — BACITRACIN 50000 UNITS IM SOLR
INTRAMUSCULAR | Status: AC
  Filled 2012-12-29: qty 1

## 2012-12-29 MED ORDER — HEMOSTATIC AGENTS (NO CHARGE) OPTIME
TOPICAL | Status: DC | PRN
  Administered 2012-12-29: 1 via TOPICAL

## 2012-12-29 MED ORDER — MORPHINE SULFATE 15 MG PO TABS
30.0000 mg | ORAL_TABLET | Freq: Three times a day (TID) | ORAL | Status: DC
  Administered 2012-12-29 – 2012-12-30 (×2): 30 mg via ORAL
  Filled 2012-12-29 (×2): qty 2

## 2012-12-29 MED ORDER — PHENYLEPHRINE HCL 10 MG/ML IJ SOLN
INTRAMUSCULAR | Status: DC | PRN
  Administered 2012-12-29: 80 ug via INTRAVENOUS
  Administered 2012-12-29 (×2): 40 ug via INTRAVENOUS

## 2012-12-29 MED ORDER — HYPROMELLOSE (GONIOSCOPIC) 2.5 % OP SOLN
1.0000 [drp] | Freq: Every day | OPHTHALMIC | Status: DC
  Administered 2012-12-29: 1 [drp] via OPHTHALMIC
  Filled 2012-12-29: qty 15

## 2012-12-29 MED ORDER — FENTANYL CITRATE 0.05 MG/ML IJ SOLN
INTRAMUSCULAR | Status: DC | PRN
  Administered 2012-12-29: 150 ug via INTRAVENOUS

## 2012-12-29 MED ORDER — MUPIROCIN 2 % EX OINT
TOPICAL_OINTMENT | CUTANEOUS | Status: AC
  Administered 2012-12-29: 1 via NASAL
  Filled 2012-12-29: qty 22

## 2012-12-29 MED ORDER — ACETAMINOPHEN 650 MG RE SUPP: 650.0000 mg | RECTAL | Status: DC | PRN

## 2012-12-29 MED ORDER — CARISOPRODOL 350 MG PO TABS: 350.0000 mg | ORAL_TABLET | Freq: Four times a day (QID) | ORAL | Status: DC | PRN

## 2012-12-29 MED ORDER — PHENOL 1.4 % MT LIQD: 1.0000 | OROMUCOSAL | Status: DC | PRN

## 2012-12-29 MED ORDER — ONDANSETRON HCL 4 MG/2ML IJ SOLN: 4.0000 mg | INTRAMUSCULAR | Status: DC | PRN

## 2012-12-29 MED ORDER — ONDANSETRON HCL 4 MG/2ML IJ SOLN
INTRAMUSCULAR | Status: DC | PRN
  Administered 2012-12-29: 4 mg via INTRAVENOUS

## 2012-12-29 MED ORDER — SODIUM CHLORIDE 0.9 % IV SOLN
INTRAVENOUS | Status: AC
  Filled 2012-12-29: qty 500

## 2012-12-29 MED ORDER — LIDOCAINE HCL (CARDIAC) 20 MG/ML IV SOLN
INTRAVENOUS | Status: DC | PRN
  Administered 2012-12-29: 100 mg via INTRAVENOUS

## 2012-12-29 MED ORDER — ONDANSETRON HCL 4 MG/2ML IJ SOLN: 4.0000 mg | Freq: Four times a day (QID) | INTRAMUSCULAR | Status: DC | PRN

## 2012-12-29 MED ORDER — MELOXICAM 7.5 MG PO TABS
7.5000 mg | ORAL_TABLET | Freq: Two times a day (BID) | ORAL | Status: DC
  Administered 2012-12-29 – 2012-12-30 (×2): 7.5 mg via ORAL
  Filled 2012-12-29 (×3): qty 1

## 2012-12-29 MED ORDER — BUPROPION HCL ER (XL) 300 MG PO TB24
300.0000 mg | ORAL_TABLET | Freq: Every morning | ORAL | Status: DC
  Administered 2012-12-30: 300 mg via ORAL
  Filled 2012-12-29: qty 1

## 2012-12-29 MED ORDER — GLYCOPYRROLATE 0.2 MG/ML IJ SOLN
INTRAMUSCULAR | Status: DC | PRN
  Administered 2012-12-29: 0.2 mg via INTRAVENOUS
  Administered 2012-12-29: 0.6 mg via INTRAVENOUS

## 2012-12-29 MED ORDER — ACETAMINOPHEN 325 MG PO TABS: 650.0000 mg | ORAL_TABLET | ORAL | Status: DC | PRN

## 2012-12-29 MED ORDER — DEXAMETHASONE SODIUM PHOSPHATE 4 MG/ML IJ SOLN: INTRAMUSCULAR | Status: DC | PRN

## 2012-12-29 MED ORDER — LAMOTRIGINE 200 MG PO TABS
200.0000 mg | ORAL_TABLET | Freq: Every day | ORAL | Status: DC
Start: 2012-12-29 — End: 2012-12-30
  Administered 2012-12-29: 200 mg via ORAL
  Filled 2012-12-29 (×2): qty 1

## 2012-12-29 MED ORDER — OXYCODONE HCL 5 MG/5ML PO SOLN: 5.0000 mg | Freq: Once | ORAL | Status: DC | PRN

## 2012-12-29 MED ORDER — NEOSTIGMINE METHYLSULFATE 1 MG/ML IJ SOLN
INTRAMUSCULAR | Status: DC | PRN
  Administered 2012-12-29: 4 mg via INTRAVENOUS

## 2012-12-29 MED ORDER — LACTATED RINGERS IV SOLN
INTRAVENOUS | Status: DC | PRN
  Administered 2012-12-29 (×2): via INTRAVENOUS

## 2012-12-29 MED ORDER — THROMBIN 5000 UNITS EX KIT
PACK | CUTANEOUS | Status: DC | PRN
  Administered 2012-12-29 (×2): 5000 [IU] via TOPICAL

## 2012-12-29 MED ORDER — SODIUM CHLORIDE 0.9 % IJ SOLN: 3.0000 mL | Freq: Two times a day (BID) | INTRAMUSCULAR | Status: DC

## 2012-12-29 MED ORDER — ARTIFICIAL TEARS OP OINT
TOPICAL_OINTMENT | OPHTHALMIC | Status: DC | PRN
  Administered 2012-12-29: 1 via OPHTHALMIC

## 2012-12-29 MED ORDER — SODIUM CHLORIDE 0.9 % IJ SOLN: 3.0000 mL | INTRAMUSCULAR | Status: DC | PRN

## 2012-12-29 MED ORDER — ACETAMINOPHEN 10 MG/ML IV SOLN
INTRAVENOUS | Status: AC
  Administered 2012-12-29: 1000 mg via INTRAVENOUS
  Filled 2012-12-29: qty 100

## 2012-12-29 MED ORDER — ARIPIPRAZOLE 5 MG PO TABS
5.0000 mg | ORAL_TABLET | Freq: Every day | ORAL | Status: DC
  Administered 2012-12-29: 5 mg via ORAL
  Filled 2012-12-29 (×2): qty 1

## 2012-12-29 MED ORDER — HYDROMORPHONE HCL PF 1 MG/ML IJ SOLN
0.2500 mg | INTRAMUSCULAR | Status: DC | PRN
  Administered 2012-12-29 (×2): 0.5 mg via INTRAVENOUS

## 2012-12-29 MED ORDER — OXYCODONE HCL 5 MG PO TABS: 5.0000 mg | ORAL_TABLET | Freq: Once | ORAL | Status: DC | PRN

## 2012-12-29 MED ORDER — SODIUM CHLORIDE 0.9 % IR SOLN
Status: DC | PRN
  Administered 2012-12-29: 16:00:00

## 2012-12-29 MED ORDER — 0.9 % SODIUM CHLORIDE (POUR BTL) OPTIME
TOPICAL | Status: DC | PRN
  Administered 2012-12-29: 1000 mL

## 2012-12-29 MED ORDER — MENTHOL 3 MG MT LOZG: 1.0000 | LOZENGE | OROMUCOSAL | Status: DC | PRN

## 2012-12-29 MED ORDER — MIDAZOLAM HCL 5 MG/5ML IJ SOLN
INTRAMUSCULAR | Status: DC | PRN
  Administered 2012-12-29: 2 mg via INTRAVENOUS

## 2012-12-29 MED ORDER — SODIUM CHLORIDE 0.9 % IV SOLN: 250.0000 mL | INTRAVENOUS | Status: DC

## 2012-12-29 MED ORDER — DIAZEPAM 5 MG PO TABS
5.0000 mg | ORAL_TABLET | Freq: Two times a day (BID) | ORAL | Status: DC
  Administered 2012-12-29: 10 mg via ORAL
  Administered 2012-12-30: 5 mg via ORAL
  Filled 2012-12-29: qty 2
  Filled 2012-12-29: qty 1

## 2012-12-29 MED ORDER — PROPOFOL 10 MG/ML IV BOLUS
INTRAVENOUS | Status: DC | PRN
  Administered 2012-12-29: 100 mg via INTRAVENOUS

## 2012-12-29 MED ORDER — ACETAMINOPHEN 10 MG/ML IV SOLN
1000.0000 mg | Freq: Four times a day (QID) | INTRAVENOUS | Status: AC
  Administered 2012-12-29 – 2012-12-30 (×4): 1000 mg via INTRAVENOUS
  Filled 2012-12-29 (×5): qty 100

## 2012-12-29 MED ORDER — HYDROMORPHONE HCL PF 1 MG/ML IJ SOLN
INTRAMUSCULAR | Status: AC
  Filled 2012-12-29: qty 1

## 2012-12-29 MED ORDER — HYDROXYZINE HCL 25 MG PO TABS
25.0000 mg | ORAL_TABLET | Freq: Every day | ORAL | Status: DC
Start: 2012-12-29 — End: 2012-12-30
  Administered 2012-12-29: 25 mg via ORAL
  Filled 2012-12-29 (×2): qty 1

## 2012-12-29 MED ORDER — POTASSIUM CHLORIDE IN NACL 20-0.9 MEQ/L-% IV SOLN
INTRAVENOUS | Status: DC
  Administered 2012-12-29 – 2012-12-30 (×2): via INTRAVENOUS
  Filled 2012-12-29 (×3): qty 1000

## 2012-12-29 MED ORDER — CITALOPRAM HYDROBROMIDE 40 MG PO TABS
40.0000 mg | ORAL_TABLET | Freq: Every day | ORAL | Status: DC
  Administered 2012-12-29: 40 mg via ORAL
  Filled 2012-12-29 (×2): qty 1

## 2012-12-29 MED ORDER — ROCURONIUM BROMIDE 100 MG/10ML IV SOLN
INTRAVENOUS | Status: DC | PRN
  Administered 2012-12-29: 50 mg via INTRAVENOUS

## 2012-12-29 SURGICAL SUPPLY — 41 items
BAG DECANTER FOR FLEXI CONT (MISCELLANEOUS) ×2 IMPLANT
BENZOIN TINCTURE PRP APPL 2/3 (GAUZE/BANDAGES/DRESSINGS) ×2 IMPLANT
BUR MATCHSTICK NEURO 3.0 LAGG (BURR) ×2 IMPLANT
CANISTER SUCTION 2500CC (MISCELLANEOUS) ×2 IMPLANT
CLOTH BEACON ORANGE TIMEOUT ST (SAFETY) ×2 IMPLANT
CONT SPEC 4OZ CLIKSEAL STRL BL (MISCELLANEOUS) ×4 IMPLANT
DRAPE LAPAROTOMY 100X72X124 (DRAPES) ×2 IMPLANT
DRAPE MICROSCOPE ZEISS OPMI (DRAPES) IMPLANT
DRAPE POUCH INSTRU U-SHP 10X18 (DRAPES) ×2 IMPLANT
DRAPE SURG 17X23 STRL (DRAPES) ×2 IMPLANT
DRESSING TELFA 8X3 (GAUZE/BANDAGES/DRESSINGS) ×2 IMPLANT
DRSG OPSITE 4X5.5 SM (GAUZE/BANDAGES/DRESSINGS) ×2 IMPLANT
DURAPREP 26ML APPLICATOR (WOUND CARE) ×2 IMPLANT
ELECT REM PT RETURN 9FT ADLT (ELECTROSURGICAL) ×2
ELECTRODE REM PT RTRN 9FT ADLT (ELECTROSURGICAL) ×1 IMPLANT
GAUZE SPONGE 4X4 16PLY XRAY LF (GAUZE/BANDAGES/DRESSINGS) IMPLANT
GLOVE BIO SURGEON STRL SZ8 (GLOVE) ×2 IMPLANT
GLOVE ECLIPSE 7.5 STRL STRAW (GLOVE) ×4 IMPLANT
GLOVE INDICATOR 7.5 STRL GRN (GLOVE) ×2 IMPLANT
GOWN BRE IMP SLV AUR LG STRL (GOWN DISPOSABLE) IMPLANT
GOWN BRE IMP SLV AUR XL STRL (GOWN DISPOSABLE) ×4 IMPLANT
GOWN STRL REIN 2XL LVL4 (GOWN DISPOSABLE) IMPLANT
HEMOSTAT POWDER KIT SURGIFOAM (HEMOSTASIS) IMPLANT
KIT BASIN OR (CUSTOM PROCEDURE TRAY) ×2 IMPLANT
KIT ROOM TURNOVER OR (KITS) ×2 IMPLANT
NEEDLE HYPO 25X1 1.5 SAFETY (NEEDLE) ×2 IMPLANT
NEEDLE SPNL 20GX3.5 QUINCKE YW (NEEDLE) IMPLANT
NS IRRIG 1000ML POUR BTL (IV SOLUTION) ×2 IMPLANT
PACK LAMINECTOMY NEURO (CUSTOM PROCEDURE TRAY) ×2 IMPLANT
PAD ARMBOARD 7.5X6 YLW CONV (MISCELLANEOUS) ×6 IMPLANT
RUBBERBAND STERILE (MISCELLANEOUS) IMPLANT
SPONGE SURGIFOAM ABS GEL SZ50 (HEMOSTASIS) ×2 IMPLANT
STRIP CLOSURE SKIN 1/2X4 (GAUZE/BANDAGES/DRESSINGS) ×2 IMPLANT
SUT VIC AB 0 CT1 18XCR BRD8 (SUTURE) ×1 IMPLANT
SUT VIC AB 0 CT1 8-18 (SUTURE) ×1
SUT VIC AB 2-0 CP2 18 (SUTURE) ×2 IMPLANT
SUT VIC AB 3-0 SH 8-18 (SUTURE) ×2 IMPLANT
SYR 20ML ECCENTRIC (SYRINGE) ×2 IMPLANT
TOWEL OR 17X24 6PK STRL BLUE (TOWEL DISPOSABLE) ×2 IMPLANT
TOWEL OR 17X26 10 PK STRL BLUE (TOWEL DISPOSABLE) ×2 IMPLANT
WATER STERILE IRR 1000ML POUR (IV SOLUTION) ×2 IMPLANT

## 2012-12-29 NOTE — Anesthesia Preprocedure Evaluation (Addendum)
Anesthesia Evaluation  Patient identified by MRN, date of birth, ID band Patient awake    Reviewed: Allergy & Precautions, H&P , NPO status , Patient's Chart, lab work & pertinent test results  History of Anesthesia Complications (+) MALIGNANT HYPERTHERMIANegative for: history of anesthetic complications  Airway Mallampati: II TM Distance: >3 FB Neck ROM: Full    Dental No notable dental hx. (+) Teeth Intact and Dental Advisory Given   Pulmonary Current Smoker,  Bronchitis--20 years ago.  No symptoms since. breath sounds clear to auscultation        Cardiovascular negative cardio ROS  Rhythm:Regular Rate:Normal     Neuro/Psych  Headaches, Seizures -, Well Controlled,  Anxiety Depression Seizures with pregnancy.  No seizures since 20 years ago.  No medications.  Takes Excedrin for migraines.    GI/Hepatic Irritable bowel syndrome.  Constipation.     Endo/Other  negative endocrine ROS  Renal/GU Renal diseaseKidney stones.  Lithotripsy 2012.  No problems since.     Musculoskeletal   Abdominal Normal abdominal exam  (+)   Peds  Hematology negative hematology ROS (+)   Anesthesia Other Findings   Reproductive/Obstetrics                          Anesthesia Physical Anesthesia Plan  ASA: II  Anesthesia Plan: General   Post-op Pain Management:    Induction: Intravenous  Airway Management Planned: Oral ETT  Additional Equipment:   Intra-op Plan:   Post-operative Plan: Extubation in OR  Informed Consent: I have reviewed the patients History and Physical, chart, labs and discussed the procedure including the risks, benefits and alternatives for the proposed anesthesia with the patient or authorized representative who has indicated his/her understanding and acceptance.   Dental advisory given  Plan Discussed with: CRNA, Anesthesiologist and Surgeon  Anesthesia Plan Comments:          Anesthesia Quick Evaluation

## 2012-12-29 NOTE — Addendum Note (Signed)
Addendum  created 12/29/12 1755 by Raiford Simmonds, MD   Modules edited:Orders, PRL Based Order Sets

## 2012-12-29 NOTE — Preoperative (Signed)
Beta Blockers   Reason not to administer Beta Blockers:Not Applicable 

## 2012-12-29 NOTE — Transfer of Care (Signed)
Immediate Anesthesia Transfer of Care Note  Patient: Katelyn Galloway  Procedure(s) Performed: Procedure(s) (LRB) with comments: HARDWARE REMOVAL (N/A) - Lumbar hardware extraction  Patient Location: PACU  Anesthesia Type:General  Level of Consciousness: awake, alert  and oriented  Airway & Oxygen Therapy: Patient Spontanous Breathing and Patient connected to face mask oxygen  Post-op Assessment: Report given to PACU RN, Post -op Vital signs reviewed and stable and Patient moving all extremities X 4  Post vital signs: Reviewed and stable  Complications: No apparent anesthesia complications

## 2012-12-29 NOTE — H&P (Signed)
Subjective: Patient is a 52 y.o. female admitted for hardware removal . Onset of symptoms was several months ago, gradually worsening since that time.  The pain is rated severe, and is located at the across the lower back. The pain is described as aching and soreness and occurs intermittently. The symptoms have been progressive. Symptoms are exacerbated by nothing in particular. MRI or CT showed fusion.   Past Medical History  Diagnosis Date  . Anxiety   . Depression   . Anemia 1970  . Seizures     x 2 during pregnancy per pt was not diagnosed with eclampsia  . Headache   . Kidney stones   . Irritable bowel syndrome (IBS)   . Bronchitis     "years ago"  . Chronic back pain     hx of    Past Surgical History  Procedure Date  . Abdominal hysterectomy 1992  . Knee arthroscopy     right knee  . Tubal ligation   . Cholecystectomy 2009 or 2010  . Kindey stone removal   . Back surgery     x2    Prior to Admission medications   Medication Sig Start Date End Date Taking? Authorizing Provider  ARIPiprazole (ABILIFY) 5 MG tablet Take 5 mg by mouth daily.   Yes Historical Provider, MD  B Complex-C (B-COMPLEX WITH VITAMIN C) tablet Take 1 tablet by mouth every morning.     Yes Historical Provider, MD  buPROPion (WELLBUTRIN XL) 150 MG 24 hr tablet Take 300 mg by mouth every morning.    Yes Historical Provider, MD  carisoprodol (SOMA) 350 MG tablet Take 350 mg by mouth 4 (four) times daily as needed. spasm   Yes Historical Provider, MD  citalopram (CELEXA) 40 MG tablet Take 40 mg by mouth daily.   Yes Historical Provider, MD  diazepam (VALIUM) 5 MG tablet Take 5-10 mg by mouth 2 (two) times daily. 1 tablet every morning and 2 tablets every evening   Yes Historical Provider, MD  dicyclomine (BENTYL) 20 MG tablet Take 20 mg by mouth 4 (four) times daily as needed. For irritable bowel syndrome    Yes Historical Provider, MD  hydroxypropyl methylcellulose (ISOPTO TEARS) 2.5 % ophthalmic solution  Place 1 drop into both eyes at bedtime.   Yes Historical Provider, MD  hydrOXYzine (ATARAX/VISTARIL) 25 MG tablet Take 25 mg by mouth at bedtime.   Yes Historical Provider, MD  lamoTRIgine (LAMICTAL) 100 MG tablet Take 200 mg by mouth at bedtime.   Yes Historical Provider, MD  meloxicam (MOBIC) 7.5 MG tablet Take 7.5 mg by mouth 2 (two) times daily.   Yes Historical Provider, MD  Menthol-Methyl Salicylate (MUSCLE RUB) 10-15 % CREA Apply 1 application topically daily as needed. Joint pain   Yes Historical Provider, MD  morphine (MSIR) 30 MG tablet Take 30 mg by mouth 3 (three) times daily.     Yes Historical Provider, MD  Wheat Dextrin (BENEFIBER PO) Take 1 packet by mouth daily.   Yes Historical Provider, MD   Allergies  Allergen Reactions  . Gabapentin Other (See Comments)    Neurological reaction- makes her move in slow motion    History  Substance Use Topics  . Smoking status: Current Some Day Smoker -- 0.2 packs/day  . Smokeless tobacco: Never Used     Comment: Pt reports using an electric cigarette ~ 2 times a day  . Alcohol Use: 0.6 oz/week    1 Glasses of wine per week  Family History  Problem Relation Age of Onset  . Anesthesia problems Neg Hx      Review of Systems  Positive ROS: neg  All other systems have been reviewed and were otherwise negative with the exception of those mentioned in the HPI and as above.  Objective: Vital signs in last 24 hours: Weight:  [54.432 kg (120 lb)] 54.432 kg (120 lb) (01/08 1538)  General Appearance: Alert, cooperative, no distress, appears stated age Head: Normocephalic, without obvious abnormality, atraumatic Eyes: PERRL, conjunctiva/corneas clear, EOM's intact, fundi benign, both eyes      Ears: Normal TM's and external ear canals, both ears Throat: Lips, mucosa, and tongue normal; teeth and gums normal Neck: Supple, symmetrical, trachea midline, no adenopathy; thyroid: No enlargement/tenderness/nodules; no carotid bruit or  JVD Back: Symmetric, no curvature, ROM normal, no CVA tenderness Lungs: Clear to auscultation bilaterally, respirations unlabored Heart: Regular rate and rhythm, S1 and S2 normal, no murmur, rub or gallop Abdomen: Soft, non-tender, bowel sounds active all four quadrants, no masses, no organomegaly Extremities: Extremities normal, atraumatic, no cyanosis or edema Pulses: 2+ and symmetric all extremities Skin: Skin color, texture, turgor normal, no rashes or lesions  NEUROLOGIC:   Mental status: Alert and oriented x4,  no aphasia, good attention span, fund of knowledge, and memory Motor Exam - grossly normal Sensory Exam - grossly normal Reflexes: 1+ Coordination - grossly normal Gait - grossly normal Balance - grossly normal Cranial Nerves: I: smell Not tested  II: visual acuity  OS: nl    OD: nl  II: visual fields Full to confrontation  II: pupils Equal, round, reactive to light  III,VII: ptosis None  III,IV,VI: extraocular muscles  Full ROM  V: mastication Normal  V: facial light touch sensation  Normal  V,VII: corneal reflex  Present  VII: facial muscle function - upper  Normal  VII: facial muscle function - lower Normal  VIII: hearing Not tested  IX: soft palate elevation  Normal  IX,X: gag reflex Present  XI: trapezius strength  5/5  XI: sternocleidomastoid strength 5/5  XI: neck flexion strength  5/5  XII: tongue strength  Normal    Data Review Lab Results  Component Value Date   WBC 6.4 11/24/2011   HGB 12.7 11/24/2011   HCT 37.8 11/24/2011   MCV 88.5 11/24/2011   PLT 244 11/24/2011   Lab Results  Component Value Date   NA 138 11/24/2011   K 4.4 11/24/2011   CL 99 11/24/2011   CO2 34* 11/24/2011   BUN 18 11/24/2011   CREATININE 0.84 11/24/2011   GLUCOSE 91 11/24/2011   Lab Results  Component Value Date   INR 0.97 11/24/2011    Assessment/Plan: Patient admitted for hardware removal. Patient has failed conservative therapy.  I explained the condition and  procedure to the patient and answered any questions.  Patient wishes to proceed with procedure as planned. Understands risks/ benefits and typical outcomes of procedure.   Maverik Foot S 12/29/2012 12:21 PM

## 2012-12-29 NOTE — Anesthesia Postprocedure Evaluation (Signed)
Anesthesia Post Note  Patient: Katelyn Galloway  Procedure(s) Performed: Procedure(s) (LRB): HARDWARE REMOVAL (N/A)  Anesthesia type: General  Patient location: PACU  Post pain: Pain level controlled and Adequate analgesia  Post assessment: Post-op Vital signs reviewed, Patient's Cardiovascular Status Stable, Respiratory Function Stable, Patent Airway and Pain level controlled  Last Vitals:  Filed Vitals:   12/29/12 1658  BP:   Pulse:   Temp: 36.6 C  Resp:     Post vital signs: Reviewed and stable  Level of consciousness: awake, alert  and oriented  Complications: No apparent anesthesia complications

## 2012-12-29 NOTE — Op Note (Signed)
12/29/2012  4:56 PM  PATIENT:  Katelyn Galloway  52 y.o. female  PRE-OPERATIVE DIAGNOSIS:  Painful hardware status post lumbar spinal fusion  POST-OPERATIVE DIAGNOSIS:  Same  PROCEDURE:  Lumbar reexploration with exploration of fusion and removal of painful hardware  SURGEON:  Marikay Alar, MD  ASSISTANTS: None  ANESTHESIA:   General  EBL: Less than 20 ml  Total I/O In: 1000 [I.V.:1000] Out: -   BLOOD ADMINISTERED:none  DRAINS: None   SPECIMEN:  No Specimen  INDICATION FOR PROCEDURE: This patient underwent a lumbar fusion about a year ago. She had back pain consistent with painful hardware. I recommended removal of hardware in hopes of improving her pain syndrome. Patient understood the risks, benefits, and alternatives and potential outcomes and wished to proceed.  PROCEDURE DETAILS: The patient was taken to the operating room and after induction of adequate generalized endotracheal anesthesia she was rolled into the prone position on the Wilson frame and all pressure points were padded. The lumbar region was cleaned and then prepped with DuraPrep and draped in the usual sterile fashion. Her old incision was opened and dissection was taken down through the lumbar The fascia and the paraspinous musculature was taken down to expose the old hardware. I removed the locking caps and the rods. I then pulled on each screw in succession. All screws moved together. I explored the fusion to make sure she had a solid fusion. Appeared to be solid bone. Therefore removed all 4 screws. I then irrigated the wound with copious amounts of bacitracin-containing saline solution.  I dried all bleeding points and then closed the wound in layers of 0 Vicryl in the fascia, 2-0 Vicryl in the subcutaneous tissues, and 3-0 Vicryl and subcuticular tissues. The drapes were removed. A sterile dressing was applied. Patient was awakened from general anesthesia and transferred to the recovery room in stable  condition. At the end of the procedure all sponge needle and instrument counts were correct.  PLAN OF CARE: Admit for overnight observation  PATIENT DISPOSITION:  PACU - hemodynamically stable.   Delay start of Pharmacological VTE agent (>24hrs) due to surgical blood loss or risk of bleeding:  yes

## 2012-12-30 ENCOUNTER — Encounter (HOSPITAL_COMMUNITY): Payer: Self-pay | Admitting: Neurological Surgery

## 2012-12-30 NOTE — Discharge Summary (Signed)
Physician Discharge Summary  Patient ID: Katelyn Galloway MRN: [id number] DOB/AGE: Jul 01, 1961 52 y.o.  Admit date: 12/29/2012 Discharge date: 12/30/2012  Admission Diagnoses: Painful hardware    Discharge Diagnoses: Same   Discharged Condition: good  Hospital Course: The patient was admitted on 12/29/2012 and taken to the operating room where the patient underwent hardware removal. The patient tolerated the procedure well and was taken to the recovery room and then to the floor in stable condition. The hospital course was routine. There were no complications. The wound remained clean dry and intact. Pt had appropriate back soreness. No complaints of leg pain or new N/T/W. The patient remained afebrile with stable vital signs, and tolerated a regular diet. The patient continued to increase activities, and pain was well controlled with oral pain medications.   Consults: None  Significant Diagnostic Studies:  Results for orders placed during the hospital encounter of 12/29/12  SURGICAL PCR SCREEN      Component Value Range   MRSA, PCR NEGATIVE  NEGATIVE   Staphylococcus aureus NEGATIVE  NEGATIVE  CBC      Component Value Range   WBC 5.2  4.0 - 10.5 K/uL   RBC 4.40  3.87 - 5.11 MIL/uL   Hemoglobin 13.2  12.0 - 15.0 g/dL   HCT 40.9  81.1 - 91.4 %   MCV 91.4  78.0 - 100.0 fL   MCH 30.0  26.0 - 34.0 pg   MCHC 32.8  30.0 - 36.0 g/dL   RDW 78.2  95.6 - 21.3 %   Platelets 247  150 - 400 K/uL  BASIC METABOLIC PANEL      Component Value Range   Sodium 143  135 - 145 mEq/L   Potassium 4.7  3.5 - 5.1 mEq/L   Chloride 104  96 - 112 mEq/L   CO2 32  19 - 32 mEq/L   Glucose, Bld 86  70 - 99 mg/dL   BUN 15  6 - 23 mg/dL   Creatinine, Ser 0.86  0.50 - 1.10 mg/dL   Calcium 9.4  8.4 - 57.8 mg/dL   GFR calc non Af Amer 79 (*) >90 mL/min   GFR calc Af Amer >90  >90 mL/min    No results found.  Antibiotics:  Anti-infectives     Start     Dose/Rate Route Frequency Ordered Stop   12/29/12  2200   ceFAZolin (ANCEF) IVPB 1 g/50 mL premix        1 g 100 mL/hr over 30 Minutes Intravenous Every 8 hours 12/29/12 1834 12/30/12 0548   12/29/12 1623   bacitracin 50,000 Units in sodium chloride irrigation 0.9 % 500 mL irrigation  Status:  Discontinued          As needed 12/29/12 1623 12/29/12 1657   12/29/12 1536   bacitracin 46962 UNITS injection     Comments: WATSON, LAUREN: cabinet override         12/29/12 1536 12/30/12 0344   12/29/12 0600   ceFAZolin (ANCEF) IVPB 2 g/50 mL premix        2 g 100 mL/hr over 30 Minutes Intravenous On call to O.R. 12/28/12 1424 12/29/12 1611          Discharge Exam: Blood pressure 100/51, pulse 65, temperature 98 F (36.7 C), temperature source Oral, resp. rate 19, height 5\' 5"  (1.651 m), weight 55.2 kg (121 lb 11.1 oz), SpO2 97.00%. Neurologic: Grossly normal Incision dry  Discharge Medications:     Medication List  As of 12/30/2012 12:59 PM    TAKE these medications         ARIPiprazole 5 MG tablet   Commonly known as: ABILIFY   Take 5 mg by mouth daily.      B-complex with vitamin C tablet   Take 1 tablet by mouth every morning.      BENEFIBER PO   Take 1 packet by mouth daily.      buPROPion 150 MG 24 hr tablet   Commonly known as: WELLBUTRIN XL   Take 300 mg by mouth every morning.      carisoprodol 350 MG tablet   Commonly known as: SOMA   Take 350 mg by mouth 4 (four) times daily as needed. spasm      citalopram 40 MG tablet   Commonly known as: CELEXA   Take 40 mg by mouth daily.      diazepam 5 MG tablet   Commonly known as: VALIUM   Take 5-10 mg by mouth 2 (two) times daily. 1 tablet every morning and 2 tablets every evening      dicyclomine 20 MG tablet   Commonly known as: BENTYL   Take 20 mg by mouth 4 (four) times daily as needed. For irritable bowel syndrome      hydroxypropyl methylcellulose 2.5 % ophthalmic solution   Commonly known as: ISOPTO TEARS   Place 1 drop into both eyes at bedtime.        hydrOXYzine 25 MG tablet   Commonly known as: ATARAX/VISTARIL   Take 25 mg by mouth at bedtime.      lamoTRIgine 100 MG tablet   Commonly known as: LAMICTAL   Take 200 mg by mouth at bedtime.      meloxicam 7.5 MG tablet   Commonly known as: MOBIC   Take 7.5 mg by mouth 2 (two) times daily.      morphine 30 MG tablet   Commonly known as: MSIR   Take 30 mg by mouth 3 (three) times daily.      Muscle Rub 10-15 % Crea   Apply 1 application topically daily as needed. Joint pain        Disposition: Home  Final Dx: Hardware removal      Discharge Orders    Future Orders Please Complete By Expires   Diet - low sodium heart healthy      Increase activity slowly      Discharge instructions      Comments:   No bending twisting or heavy lifting   Remove dressing in 48 hours      Call MD for:  temperature >100.4      Call MD for:  persistant nausea and vomiting      Call MD for:  severe uncontrolled pain      Call MD for:  redness, tenderness, or signs of infection (pain, swelling, redness, odor or green/yellow discharge around incision site)      Call MD for:  difficulty breathing, headache or visual disturbances         Follow-up Information    Follow up with Matayah Reyburn S, MD. Schedule an appointment as soon as possible for a visit in 2 weeks.   Contact information:   [address] [place] [address] [phone]           Signed: Tia Alert 12/30/2012, 12:59 PM

## 2014-12-31 ENCOUNTER — Other Ambulatory Visit: Payer: Self-pay | Admitting: Anesthesiology

## 2015-01-04 ENCOUNTER — Encounter (HOSPITAL_COMMUNITY)
Admission: RE | Admit: 2015-01-04 | Discharge: 2015-01-04 | Disposition: A | Discharge status: Home / Self Care | Payer: Worker's Compensation | Source: Ambulatory Visit | Attending: Anesthesiology | Admitting: Anesthesiology

## 2015-01-04 ENCOUNTER — Encounter (HOSPITAL_COMMUNITY): Payer: Self-pay

## 2015-01-04 DIAGNOSIS — Z01812 Encounter for preprocedural laboratory examination: Secondary | ICD-10-CM | POA: Diagnosis not present

## 2015-01-04 DIAGNOSIS — M549 Dorsalgia, unspecified: Secondary | ICD-10-CM | POA: Insufficient documentation

## 2015-01-04 DIAGNOSIS — Z01818 Encounter for other preprocedural examination: Secondary | ICD-10-CM | POA: Insufficient documentation

## 2015-01-04 HISTORY — DX: Personal history of urinary calculi: Z87.442

## 2015-01-04 HISTORY — DX: Unspecified asthma, uncomplicated: J45.909

## 2015-01-04 HISTORY — DX: Unspecified osteoarthritis, unspecified site: M19.90

## 2015-01-04 HISTORY — DX: Gastro-esophageal reflux disease without esophagitis: K21.9

## 2015-01-04 HISTORY — DX: Palpitations: R00.2

## 2015-01-04 LAB — URINALYSIS, ROUTINE W REFLEX MICROSCOPIC
Bilirubin Urine: NEGATIVE
Glucose, UA: NEGATIVE mg/dL
Hgb urine dipstick: NEGATIVE
Ketones, ur: NEGATIVE mg/dL
Leukocytes, UA: NEGATIVE
Nitrite: NEGATIVE
Protein, ur: NEGATIVE mg/dL
Specific Gravity, Urine: 1.006 (ref 1.005–1.030)
Urobilinogen, UA: 0.2 mg/dL (ref 0.0–1.0)
pH: 6.5 (ref 5.0–8.0)

## 2015-01-04 LAB — CBC
HCT: 38.9 % (ref 36.0–46.0)
Hemoglobin: 13 g/dL (ref 12.0–15.0)
MCH: 29.7 pg (ref 26.0–34.0)
MCHC: 33.4 g/dL (ref 30.0–36.0)
MCV: 88.8 fL (ref 78.0–100.0)
Platelets: 253 10*3/uL (ref 150–400)
RBC: 4.38 MIL/uL (ref 3.87–5.11)
RDW: 13.1 % (ref 11.5–15.5)
WBC: 6.5 10*3/uL (ref 4.0–10.5)

## 2015-01-04 LAB — BASIC METABOLIC PANEL
Anion gap: 10 (ref 5–15)
BUN: 11 mg/dL (ref 6–23)
CO2: 25 mmol/L (ref 19–32)
CREATININE: 0.8 mg/dL (ref 0.50–1.10)
Calcium: 8.8 mg/dL (ref 8.4–10.5)
Chloride: 104 mEq/L (ref 96–112)
GFR calc Af Amer: >90 mL/min (ref >90)
GFR, EST NON AFRICAN AMERICAN: 83 mL/min — AB (ref >90)
GLUCOSE: 92 mg/dL (ref 70–99)
POTASSIUM: 4.2 mmol/L (ref 3.5–5.1)
Sodium: 139 mmol/L (ref 135–145)

## 2015-01-04 LAB — SURGICAL PCR SCREEN
MRSA, PCR: NEGATIVE
STAPHYLOCOCCUS AUREUS: POSITIVE — AB

## 2015-01-04 NOTE — Pre-Procedure Instructions (Addendum)
Katelyn CosierKimberly G Galloway  01/04/2015   Your procedure is scheduled on:  01/11/15  [facility] at 530 AM.  Call this number if you have problems the morning of surgery: [phone]   Remember:   Do not eat food or drink liquids after midnight.   Take these medicines the morning of surgery with A SIP OF WATER: celexa, soma, pain med if needed, valium,hydroxyzine,lyrica     STOP all herbel meds, nsaids (aleve,naproxen,advil,ibuprofen) 5 days prior to surgery starting tomorrow 01/05/15 including meloxicam, vitamins,bc powders   Do not wear jewelry, make-up or nail polish.  Do not wear lotions, powders, or perfumes. You may wear deodorant.  Do not shave 48 hours prior to surgery. Men may shave face and neck.  Do not bring valuables to the hospital.  [facility] is not responsible                  for any belongings or valuables.               Contacts, dentures or bridgework may not be worn into surgery.  Leave suitcase in the car. After surgery it may be brought to your room.  For patients admitted to the hospital, discharge time is determined by your                treatment team.               Patients discharged the day of surgery will not be allowed to drive  home.  Name and phone number of your driver:   Special Instructions:  Special Instructions: [facility] - Preparing for Surgery  Before surgery, you can play an important role.  Because skin is not sterile, your skin needs to be as free of germs as possible.  You can reduce the number of germs on you skin by washing with CHG (chlorahexidine gluconate) soap before surgery.  CHG is an antiseptic cleaner which kills germs and bonds with the skin to continue killing germs even after washing.  Please DO NOT use if you have an allergy to CHG or antibacterial soaps.  If your skin becomes reddened/irritated stop using the CHG and inform your nurse when you arrive at Short Stay.  Do not shave (including legs and  underarms) for at least 48 hours prior to the first CHG shower.  You may shave your face.  Please follow these instructions carefully:   1.  Shower with CHG Soap the night before surgery and the morning of Surgery.  2.  If you choose to wash your hair, wash your hair first as usual with your normal shampoo.  3.  After you shampoo, rinse your hair and body thoroughly to remove the Shampoo.  4.  Use CHG as you would any other liquid soap.  You can apply chg directly  to the skin and wash gently with scrungie or a clean washcloth.  5.  Apply the CHG Soap to your body ONLY FROM THE NECK DOWN.  Do not use on open wounds or open sores.  Avoid contact with your eyes ears, mouth and genitals (private parts).  Wash genitals (private parts)       with your normal soap.  6.  Wash thoroughly, paying special attention to the area where your surgery will be performed.  7.  Thoroughly rinse your body with warm water from the neck down.  8.  DO NOT shower/wash with your normal soap after using and  rinsing off the CHG Soap.  9.  Pat yourself dry with a clean towel.            10.  Wear clean pajamas.            11.  Place clean sheets on your bed the night of your first shower and do not sleep with pets.  Day of Surgery  Do not apply any lotions/deodorants the morning of surgery.  Please wear clean clothes to the hospital/surgery center.   Please read over the following fact sheets that you were given: Pain Booklet, Coughing and Deep Breathing, MRSA Information and Surgical Site Infection Prevention

## 2015-01-10 MED ORDER — CEFAZOLIN SODIUM-DEXTROSE 2-3 GM-% IV SOLR: 2.0000 g | INTRAVENOUS | Status: DC

## 2015-01-11 ENCOUNTER — Ambulatory Visit (HOSPITAL_COMMUNITY): Admission: RE | Admit: 2015-01-11 | Payer: Worker's Compensation | Source: Ambulatory Visit | Admitting: Anesthesiology

## 2015-01-11 ENCOUNTER — Encounter (HOSPITAL_COMMUNITY): Admission: RE | Payer: Self-pay | Source: Ambulatory Visit

## 2015-01-11 SURGERY — INSERTION, SPINAL CORD STIMULATOR, LUMBAR
Anesthesia: Monitor Anesthesia Care

## 2015-01-24 ENCOUNTER — Other Ambulatory Visit: Payer: Self-pay | Admitting: Anesthesiology

## 2015-01-24 NOTE — H&P (Signed)
Katelyn Galloway is an 54 y.o. female.   Chief Complaint: chronic back pain, radiculopathy, HPI: 54 year old right-handed woman with a history of lumbar degenerative disc disease, status post decompression and fusion, subsequent removal of hardware, but with continued pain.  She has utilized any number of medications including high-dose opioids in the past without good control of her symptoms.  She has failed to respond intervention in a meaningful way.  Ultimately the notion of spinal cord stimuli later therapy was introduced.  The patient had a psychological evaluation that noted psychiatric issues; she is under the care of a psychiatrist, and those issues were deemed to be stable enough to pursue spinal cord stimulator therapy. Patient had a good trial of spinal cord stability later therapy, with about a 70% reduction in her pain complex, and a substantial improvement in her activity level.  Her trial was cut short by an accident in which her lead was cut, but the duration was adequate enough to note sustained improvements as described above.  She is now scheduled for permanent implantation of her spinal cord stimulator  Past Medical History  Diagnosis Date  . Anxiety   . Depression   . Anemia 1970  . Seizures     x 2 during pregnancy per pt was not diagnosed with eclampsia  . Irritable bowel syndrome (IBS)   . Bronchitis     "years ago"  . Chronic back pain     hx of  . Heart palpitations     occ if get anxious- no tests  . Asthma     child  . Bronchitis     chronic hx  . History of kidney stones   . GERD (gastroesophageal reflux disease)     occ  . Headache(784.0)     migraines  . Arthritis     Past Surgical History  Procedure Laterality Date  . Abdominal hysterectomy  1992  . Knee arthroscopy      right knee  . Tubal ligation    . Cholecystectomy  2009 or 2010  . Kindey stone removal    . Back surgery      x2  . Hardware removal  12/29/2012    Procedure: HARDWARE  REMOVAL;  Surgeon: Tia Alertavid S Jones, MD;  Location: [facility];  Service: Neurosurgery;  Laterality: N/A;  Lumbar hardware extraction    Family History  Problem Relation Age of Onset  . Anesthesia problems Neg Hx    Social History:  reports that she has been smoking Cigarettes.  She has a 9.5 pack-year smoking history. She has never used smokeless tobacco. She reports that she drinks about 0.6 oz of alcohol per week. She reports that she does not use illicit drugs.  Allergies:  Allergies  Allergen Reactions  . Gabapentin Other (See Comments)    Neurological reaction- makes her move in slow motion  . Iodine Hives  . Tape Rash    Itching, paper tape    Medications Prior to Admission  Medication Sig Dispense Refill  . Aspirin-Salicylamide-Caffeine (BC HEADACHE POWDER PO) Take by mouth as needed.    Marland Kitchen. BIOTIN PO Take 2 tablets by mouth daily.    . carbamazepine (TEGRETOL) 200 MG tablet Take 100 mg by mouth 2 (two) times daily.    . carisoprodol (SOMA) 350 MG tablet Take 350 mg by mouth 2 (two) times daily. spasm    . Cholecalciferol (VITAMIN D3) 5000 UNITS CAPS Take 5,000 Units by mouth daily.    . citalopram (  CELEXA) 40 MG tablet Take 40 mg by mouth daily.    . diazepam (VALIUM) 5 MG tablet Take 5-10 mg by mouth 2 (two) times daily. 1 tablet every morning and 2 tablets every evening    . dicyclomine (BENTYL) 20 MG tablet Take 20 mg by mouth 4 (four) times daily as needed. For irritable bowel syndrome     . FIBER PO Take 2 tablets by mouth daily.    . hydroxypropyl methylcellulose (ISOPTO TEARS) 2.5 % ophthalmic solution Place 1 drop into both eyes at bedtime.    . hydrOXYzine (ATARAX/VISTARIL) 25 MG tablet Take 25 mg by mouth daily as needed for anxiety.     . meloxicam (MOBIC) 7.5 MG tablet Take 7.5 mg by mouth 2 (two) times daily.    . Menthol-Methyl Salicylate (MUSCLE RUB) 10-15 % CREA Apply 1 application topically daily as needed. Joint pain    . morphine (MS CONTIN) 15 MG 12 hr  tablet Take 15 mg by mouth every 12 (twelve) hours.    Marland Kitchen oxyCODONE (ROXICODONE) 15 MG immediate release tablet Take 15 mg by mouth every 4 (four) hours as needed for pain (for breakthrough pain).    . Polyvinyl Alcohol-Povidone (REFRESH OP) Apply 1 drop to eye as needed (dry eyes).    . pregabalin (LYRICA) 225 MG capsule Take 225 mg by mouth 2 (two) times daily.    . Probiotic Product (PHILLIPS COLON HEALTH PO) Take 1 capsule by mouth daily.    . Wheat Dextrin (BENEFIBER PO) Take 1 packet by mouth daily. miralax      No results found for this or any previous visit (from the past 48 hour(s)). No results found.  Review of Systems  Constitutional: Negative.   HENT: Negative.   Eyes: Negative.   Respiratory: Negative.   Cardiovascular: Negative.   Gastrointestinal: Negative.   Genitourinary: Negative.   Musculoskeletal: Positive for back pain and joint pain.  Skin: Negative.   Neurological: Negative.   Endo/Heme/Allergies: Negative.   Psychiatric/Behavioral: Negative.     Blood pressure 104/58, pulse 66, resp. rate 18, SpO2 97 %. Physical Exam  Constitutional: She is oriented to person, place, and time. She appears well-developed and well-nourished.  HENT:  Head: Normocephalic and atraumatic.  Eyes: EOM are normal. Pupils are equal, round, and reactive to light.  Cardiovascular: Normal rate and regular rhythm.   Respiratory: Effort normal.  Neurological: She is alert and oriented to person, place, and time.  Skin: Skin is warm and dry.  Psychiatric: She has a normal mood and affect. Her behavior is normal. Thought content normal.     Assessment/Plan #1 chronic pain syndrome #2 lumbago #3 lumbar radiculopathy #4 lumbar postlaminectomy syndrome  PLAN: Permanent implantation of [employer] spinal cord stimulator  Jakeya Gherardi C 01/25/2015, 7:25 AM

## 2015-01-25 ENCOUNTER — Ambulatory Visit (HOSPITAL_COMMUNITY): Payer: Worker's Compensation

## 2015-01-25 ENCOUNTER — Encounter (HOSPITAL_COMMUNITY): Payer: Self-pay | Admitting: Anesthesiology

## 2015-01-25 ENCOUNTER — Encounter (HOSPITAL_COMMUNITY)
Admission: RE | Disposition: A | Discharge status: Home / Self Care | Payer: Self-pay | Source: Ambulatory Visit | Attending: Anesthesiology

## 2015-01-25 ENCOUNTER — Ambulatory Visit (HOSPITAL_COMMUNITY): Payer: Worker's Compensation | Admitting: Anesthesiology

## 2015-01-25 ENCOUNTER — Ambulatory Visit (HOSPITAL_COMMUNITY)
Admission: RE | Admit: 2015-01-25 | Discharge: 2015-01-25 | Disposition: A | Discharge status: Home / Self Care | Payer: Worker's Compensation | Source: Ambulatory Visit | Attending: Anesthesiology | Admitting: Anesthesiology

## 2015-01-25 DIAGNOSIS — K219 Gastro-esophageal reflux disease without esophagitis: Secondary | ICD-10-CM | POA: Diagnosis not present

## 2015-01-25 DIAGNOSIS — J449 Chronic obstructive pulmonary disease, unspecified: Secondary | ICD-10-CM | POA: Insufficient documentation

## 2015-01-25 DIAGNOSIS — Z419 Encounter for procedure for purposes other than remedying health state, unspecified: Secondary | ICD-10-CM

## 2015-01-25 DIAGNOSIS — M1990 Unspecified osteoarthritis, unspecified site: Secondary | ICD-10-CM | POA: Insufficient documentation

## 2015-01-25 DIAGNOSIS — F17210 Nicotine dependence, cigarettes, uncomplicated: Secondary | ICD-10-CM | POA: Insufficient documentation

## 2015-01-25 DIAGNOSIS — F418 Other specified anxiety disorders: Secondary | ICD-10-CM | POA: Insufficient documentation

## 2015-01-25 DIAGNOSIS — M961 Postlaminectomy syndrome, not elsewhere classified: Secondary | ICD-10-CM | POA: Diagnosis not present

## 2015-01-25 DIAGNOSIS — G894 Chronic pain syndrome: Secondary | ICD-10-CM | POA: Diagnosis not present

## 2015-01-25 DIAGNOSIS — M5416 Radiculopathy, lumbar region: Secondary | ICD-10-CM | POA: Insufficient documentation

## 2015-01-25 DIAGNOSIS — Z7982 Long term (current) use of aspirin: Secondary | ICD-10-CM | POA: Insufficient documentation

## 2015-01-25 HISTORY — PX: SPINAL CORD STIMULATOR INSERTION: SHX5378

## 2015-01-25 LAB — CBC WITH DIFFERENTIAL/PLATELET
Basophils Absolute: 0.1 10*3/uL (ref 0.0–0.1)
Basophils Relative: 1 % (ref 0–1)
Eosinophils Absolute: 0.3 10*3/uL (ref 0.0–0.7)
Eosinophils Relative: 4 % (ref 0–5)
HCT: 40.1 % (ref 36.0–46.0)
Hemoglobin: 13.6 g/dL (ref 12.0–15.0)
Lymphocytes Relative: 20 % (ref 12–46)
Lymphs Abs: 1.7 10*3/uL (ref 0.7–4.0)
MCH: 29.1 pg (ref 26.0–34.0)
MCHC: 33.9 g/dL (ref 30.0–36.0)
MCV: 85.7 fL (ref 78.0–100.0)
Monocytes Absolute: 0.8 10*3/uL (ref 0.1–1.0)
Monocytes Relative: 10 % (ref 3–12)
Neutro Abs: 5.5 10*3/uL (ref 1.7–7.7)
Neutrophils Relative %: 65 % (ref 43–77)
Platelets: 244 10*3/uL (ref 150–400)
RBC: 4.68 MIL/uL (ref 3.87–5.11)
RDW: 13.4 % (ref 11.5–15.5)
WBC: 8.4 10*3/uL (ref 4.0–10.5)

## 2015-01-25 LAB — BASIC METABOLIC PANEL
Anion gap: 8 (ref 5–15)
BUN: 10 mg/dL (ref 6–23)
CO2: 29 mmol/L (ref 19–32)
Calcium: 9.1 mg/dL (ref 8.4–10.5)
Chloride: 103 mmol/L (ref 96–112)
Creatinine, Ser: 0.75 mg/dL (ref 0.50–1.10)
GFR calc Af Amer: >90 mL/min (ref >90)
GFR calc non Af Amer: >90 mL/min (ref >90)
Glucose, Bld: 100 mg/dL — ABNORMAL HIGH (ref 70–99)
Potassium: 3.6 mmol/L (ref 3.5–5.1)
Sodium: 140 mmol/L (ref 135–145)

## 2015-01-25 SURGERY — INSERTION, SPINAL CORD STIMULATOR, LUMBAR
Anesthesia: Monitor Anesthesia Care | Site: Spine Thoracic

## 2015-01-25 MED ORDER — NEOSTIGMINE METHYLSULFATE 10 MG/10ML IV SOLN
INTRAVENOUS | Status: AC
  Filled 2015-01-25: qty 1

## 2015-01-25 MED ORDER — ROCURONIUM BROMIDE 50 MG/5ML IV SOLN
INTRAVENOUS | Status: AC
  Filled 2015-01-25: qty 1

## 2015-01-25 MED ORDER — DEXAMETHASONE SODIUM PHOSPHATE 4 MG/ML IJ SOLN
INTRAMUSCULAR | Status: AC
  Filled 2015-01-25: qty 2

## 2015-01-25 MED ORDER — 0.9 % SODIUM CHLORIDE (POUR BTL) OPTIME
TOPICAL | Status: DC | PRN
  Administered 2015-01-25: 1000 mL

## 2015-01-25 MED ORDER — PROPOFOL 10 MG/ML IV BOLUS
INTRAVENOUS | Status: AC
  Filled 2015-01-25: qty 20

## 2015-01-25 MED ORDER — LACTATED RINGERS IV SOLN
INTRAVENOUS | Status: DC | PRN
  Administered 2015-01-25: 07:00:00 via INTRAVENOUS

## 2015-01-25 MED ORDER — LIDOCAINE HCL (CARDIAC) 20 MG/ML IV SOLN
INTRAVENOUS | Status: DC | PRN
  Administered 2015-01-25: 30 mg via INTRAVENOUS

## 2015-01-25 MED ORDER — ONDANSETRON HCL 4 MG/2ML IJ SOLN
INTRAMUSCULAR | Status: AC
  Filled 2015-01-25: qty 2

## 2015-01-25 MED ORDER — MIDAZOLAM HCL 2 MG/2ML IJ SOLN
INTRAMUSCULAR | Status: AC
  Filled 2015-01-25: qty 2

## 2015-01-25 MED ORDER — CEFAZOLIN SODIUM-DEXTROSE 2-3 GM-% IV SOLR
2.0000 g | INTRAVENOUS | Status: AC
  Administered 2015-01-25: 2 g via INTRAVENOUS

## 2015-01-25 MED ORDER — HYDROMORPHONE HCL 1 MG/ML IJ SOLN
INTRAMUSCULAR | Status: AC
  Administered 2015-01-25: 0.5 mg via INTRAVENOUS
  Filled 2015-01-25: qty 1

## 2015-01-25 MED ORDER — BUPIVACAINE-EPINEPHRINE (PF) 0.5% -1:200000 IJ SOLN
INTRAMUSCULAR | Status: DC | PRN
  Administered 2015-01-25: 10 mL

## 2015-01-25 MED ORDER — FENTANYL CITRATE 0.05 MG/ML IJ SOLN
INTRAMUSCULAR | Status: AC
  Filled 2015-01-25: qty 5

## 2015-01-25 MED ORDER — OXYCODONE HCL 5 MG PO TABS: 5.0000 mg | ORAL_TABLET | ORAL | Status: DC | PRN

## 2015-01-25 MED ORDER — PROMETHAZINE HCL 25 MG/ML IJ SOLN: 6.2500 mg | INTRAMUSCULAR | Status: DC | PRN

## 2015-01-25 MED ORDER — HYDROMORPHONE HCL 1 MG/ML IJ SOLN
0.2500 mg | INTRAMUSCULAR | Status: DC | PRN
  Administered 2015-01-25 (×2): 0.5 mg via INTRAVENOUS

## 2015-01-25 MED ORDER — BACITRACIN-NEOMYCIN-POLYMYXIN OINTMENT TUBE
TOPICAL_OINTMENT | CUTANEOUS | Status: DC | PRN
  Administered 2015-01-25: 1 via TOPICAL

## 2015-01-25 MED ORDER — FENTANYL CITRATE 0.05 MG/ML IJ SOLN
INTRAMUSCULAR | Status: DC | PRN
  Administered 2015-01-25 (×5): 50 ug via INTRAVENOUS

## 2015-01-25 MED ORDER — MEPERIDINE HCL 25 MG/ML IJ SOLN: 6.2500 mg | INTRAMUSCULAR | Status: DC | PRN

## 2015-01-25 MED ORDER — SODIUM CHLORIDE 0.9 % IR SOLN
Status: DC | PRN
  Administered 2015-01-25: 500 mL

## 2015-01-25 MED ORDER — MIDAZOLAM HCL 5 MG/5ML IJ SOLN
INTRAMUSCULAR | Status: DC | PRN
  Administered 2015-01-25: 2 mg via INTRAVENOUS

## 2015-01-25 MED ORDER — MIDAZOLAM HCL 2 MG/2ML IJ SOLN: 0.5000 mg | Freq: Once | INTRAMUSCULAR | Status: DC | PRN

## 2015-01-25 MED ORDER — CEPHALEXIN 500 MG PO CAPS: 500.0000 mg | ORAL_CAPSULE | Freq: Three times a day (TID) | ORAL | Status: DC

## 2015-01-25 MED ORDER — GLYCOPYRROLATE 0.2 MG/ML IJ SOLN
INTRAMUSCULAR | Status: AC
  Filled 2015-01-25: qty 2

## 2015-01-25 MED ORDER — PROPOFOL INFUSION 10 MG/ML OPTIME
INTRAVENOUS | Status: DC | PRN
  Administered 2015-01-25: 100 ug/kg/min via INTRAVENOUS

## 2015-01-25 MED ORDER — PROPOFOL 10 MG/ML IV BOLUS
INTRAVENOUS | Status: DC | PRN
  Administered 2015-01-25 (×3): 20 mg via INTRAVENOUS

## 2015-01-25 MED ORDER — LIDOCAINE HCL (CARDIAC) 20 MG/ML IV SOLN
INTRAVENOUS | Status: AC
Start: 2015-01-25 — End: 2015-01-25
  Filled 2015-01-25: qty 5

## 2015-01-25 MED ORDER — ONDANSETRON HCL 4 MG/2ML IJ SOLN
INTRAMUSCULAR | Status: DC | PRN
  Administered 2015-01-25: 4 mg via INTRAVENOUS

## 2015-01-25 SURGICAL SUPPLY — 66 items
ANCHOR CLIK X NEURO (Stimulator) IMPLANT
BAG DECANTER FOR FLEXI CONT (MISCELLANEOUS) ×2 IMPLANT
BENZOIN TINCTURE PRP APPL 2/3 (GAUZE/BANDAGES/DRESSINGS) IMPLANT
BINDER ABDOMINAL 12 ML 46-62 (SOFTGOODS) ×2 IMPLANT
BLADE CLIPPER SURG (BLADE) IMPLANT
CABLE OR STIMULATOR 2X8 61 (WIRE) ×4 IMPLANT
CHLORAPREP W/TINT 26ML (MISCELLANEOUS) ×2 IMPLANT
CLIP TI WIDE RED SMALL 6 (CLIP) ×2 IMPLANT
CONT SPEC 4OZ CLIKSEAL STRL BL (MISCELLANEOUS) ×2 IMPLANT
DRAPE C-ARM 42X72 X-RAY (DRAPES) ×2 IMPLANT
DRAPE C-ARMOR (DRAPES) ×2 IMPLANT
DRAPE LAPAROTOMY 100X72X124 (DRAPES) ×2 IMPLANT
DRAPE POUCH INSTRU U-SHP 10X18 (DRAPES) ×2 IMPLANT
DRAPE SURG 17X23 STRL (DRAPES) ×2 IMPLANT
DRSG OPSITE POSTOP 3X4 (GAUZE/BANDAGES/DRESSINGS) IMPLANT
DRSG OPSITE POSTOP 4X6 (GAUZE/BANDAGES/DRESSINGS) ×4 IMPLANT
DRSG TELFA 3X8 NADH (GAUZE/BANDAGES/DRESSINGS) IMPLANT
ELECT REM PT RETURN 9FT ADLT (ELECTROSURGICAL) ×2
ELECTRODE REM PT RTRN 9FT ADLT (ELECTROSURGICAL) ×1 IMPLANT
GAUZE SPONGE 4X4 16PLY XRAY LF (GAUZE/BANDAGES/DRESSINGS) IMPLANT
GLOVE BIO SURGEON STRL SZ7 (GLOVE) IMPLANT
GLOVE BIOGEL PI IND STRL 7.5 (GLOVE) ×2 IMPLANT
GLOVE BIOGEL PI INDICATOR 7.5 (GLOVE) ×2
GLOVE ECLIPSE 7.5 STRL STRAW (GLOVE) ×2 IMPLANT
GLOVE EXAM NITRILE LRG STRL (GLOVE) IMPLANT
GLOVE EXAM NITRILE MD LF STRL (GLOVE) IMPLANT
GLOVE EXAM NITRILE XL STR (GLOVE) IMPLANT
GLOVE EXAM NITRILE XS STR PU (GLOVE) IMPLANT
GLOVE SURG SS PI 7.0 STRL IVOR (GLOVE) ×4 IMPLANT
GOWN STRL REUS W/ TWL LRG LVL3 (GOWN DISPOSABLE) ×1 IMPLANT
GOWN STRL REUS W/ TWL XL LVL3 (GOWN DISPOSABLE) ×1 IMPLANT
GOWN STRL REUS W/TWL 2XL LVL3 (GOWN DISPOSABLE) IMPLANT
GOWN STRL REUS W/TWL LRG LVL3 (GOWN DISPOSABLE) ×1
GOWN STRL REUS W/TWL XL LVL3 (GOWN DISPOSABLE) ×1
IPG PRECISION SPECTRA (Stimulator) ×2 IMPLANT
KIT BASIN OR (CUSTOM PROCEDURE TRAY) ×2 IMPLANT
KIT CHARGING (KITS) ×1
KIT CHARGING PRECISION NEURO (KITS) ×1 IMPLANT
KIT REMOTE CONTROL PRECISION (KITS) ×2 IMPLANT
KIT ROOM TURNOVER OR (KITS) ×2 IMPLANT
LEAD INFINION CX PERC 70CM (Cable) ×4 IMPLANT
LIQUID BAND (GAUZE/BANDAGES/DRESSINGS) ×2 IMPLANT
NEEDLE 18GX1X1/2 (RX/OR ONLY) (NEEDLE) IMPLANT
NEEDLE ENTRADA (NEEDLE) ×2 IMPLANT
NEEDLE ENTRADA 5IN (NEEDLE) ×2 IMPLANT
NEEDLE HYPO 25X1 1.5 SAFETY (NEEDLE) ×2 IMPLANT
NS IRRIG 1000ML POUR BTL (IV SOLUTION) ×2 IMPLANT
PACK LAMINECTOMY NEURO (CUSTOM PROCEDURE TRAY) ×2 IMPLANT
PAD ARMBOARD 7.5X6 YLW CONV (MISCELLANEOUS) ×6 IMPLANT
SPONGE LAP 4X18 X RAY DECT (DISPOSABLE) IMPLANT
SPONGE SURGIFOAM ABS GEL SZ50 (HEMOSTASIS) IMPLANT
STAPLER SKIN PROX WIDE 3.9 (STAPLE) ×2 IMPLANT
STRIP CLOSURE SKIN 1/2X4 (GAUZE/BANDAGES/DRESSINGS) IMPLANT
SUT MNCRL AB 4-0 PS2 18 (SUTURE) IMPLANT
SUT SILK 0 (SUTURE) ×1
SUT SILK 0 MO-6 18XCR BRD 8 (SUTURE) ×1 IMPLANT
SUT SILK 0 TIES 10X30 (SUTURE) IMPLANT
SUT SILK 2 0 TIES 10X30 (SUTURE) IMPLANT
SUT VIC AB 2-0 CP2 18 (SUTURE) ×6 IMPLANT
SYR EPIDURAL 5ML GLASS (SYRINGE) ×2 IMPLANT
SYRINGE 10CC LL (SYRINGE) IMPLANT
TOOL LONG TUNNEL (SPINAL CORD STIMULATOR) ×2 IMPLANT
TOWEL OR 17X24 6PK STRL BLUE (TOWEL DISPOSABLE) ×2 IMPLANT
TOWEL OR 17X26 10 PK STRL BLUE (TOWEL DISPOSABLE) ×2 IMPLANT
WATER STERILE IRR 1000ML POUR (IV SOLUTION) ×2 IMPLANT
YANKAUER SUCT BULB TIP NO VENT (SUCTIONS) ×2 IMPLANT

## 2015-01-25 NOTE — Anesthesia Postprocedure Evaluation (Signed)
  Anesthesia Post-op Note  Patient: Katelyn Galloway  Procedure(s) Performed: Procedure(s): LUMBAR SPINAL CORD STIMULATOR INSERTION (N/A)  Patient Location: PACU  Anesthesia Type:MAC  Level of Consciousness: awake, alert , oriented and patient cooperative  Airway and Oxygen Therapy: Patient Spontanous Breathing  Post-op Pain: mild  Post-op Assessment: Post-op Vital signs reviewed, Patient's Cardiovascular Status Stable, Respiratory Function Stable, Patent Airway, No signs of Nausea or vomiting and Pain level controlled  Post-op Vital Signs: Reviewed and stable  Last Vitals:  Filed Vitals:   01/25/15 1030  BP:   Pulse: 66  Temp:   Resp: 17    Complications: No apparent anesthesia complications

## 2015-01-25 NOTE — Discharge Instructions (Signed)
What to eat: ° °For your first meals, you should eat lightly; only small meals initially.  If you do not have nausea, you may eat larger meals.  Avoid spicy, greasy and heavy food.   ° °General Anesthesia, Adult, Care After  °Refer to this sheet in the next few weeks. These instructions provide you with information on caring for yourself after your procedure. Your health care provider may also give you more specific instructions. Your treatment has been planned according to current medical practices, but problems sometimes occur. Call your health care provider if you have any problems or questions after your procedure.  °WHAT TO EXPECT AFTER THE PROCEDURE  °After the procedure, it is typical to experience:  °Sleepiness.  °Nausea and vomiting. °HOME CARE INSTRUCTIONS  °For the first 24 hours after general anesthesia:  °Have a responsible person with you.  °Do not drive a car. If you are alone, do not take public transportation.  °Do not drink alcohol.  °Do not take medicine that has not been prescribed by your health care provider.  °Do not sign important papers or make important decisions.  °You may resume a normal diet and activities as directed by your health care provider.  °Change bandages (dressings) as directed.  °If you have questions or problems that seem related to general anesthesia, call the hospital and ask for the anesthetist or anesthesiologist on call. °SEEK MEDICAL CARE IF:  °You have nausea and vomiting that continue the day after anesthesia.  °You develop a rash. °SEEK IMMEDIATE MEDICAL CARE IF:  °You have difficulty breathing.  °You have chest pain.  °You have any allergic problems. °Document Released: 03/15/2001 Document Revised: 08/09/2013 Document Reviewed: 06/22/2013  °ExitCare® Patient Information ©2014 [employer].  ° °Sore Throat  ° ° °A sore throat is a painful, burning, sore, or scratchy feeling of the throat. There may be pain or tenderness when swallowing or talking. You may have  other symptoms with a sore throat. These include coughing, sneezing, fever, or a swollen neck. A sore throat is often the first sign of another sickness. These sicknesses may include a cold, flu, strep throat, or an infection called mono. Most sore throats go away without medical treatment.  °HOME CARE  °Only take medicine as told by your doctor.  °Drink enough fluids to keep your pee (urine) clear or pale yellow.  °Rest as needed.  °Try using throat sprays, lozenges, or suck on hard candy (if older than 4 years or as told).  °Sip warm liquids, such as broth, herbal tea, or warm water with honey. Try sucking on frozen ice pops or drinking cold liquids.  °Rinse the mouth (gargle) with salt water. Mix 1 teaspoon salt with 8 ounces of water.  °Do not smoke. Avoid being around others when they are smoking.  °Put a humidifier in your bedroom at night to moisten the air. You can also turn on a hot shower and sit in the bathroom for 5-10 minutes. Be sure the bathroom door is closed. °GET HELP RIGHT AWAY IF:  °You have trouble breathing.  °You cannot swallow fluids, soft foods, or your spit (saliva).  °You have more puffiness (swelling) in the throat.  °Your sore throat does not get better in 7 days.  °You feel sick to your stomach (nauseous) and throw up (vomit).  °You have a fever or lasting symptoms for more than 2-3 days.  °You have a fever and your symptoms suddenly get worse. °MAKE SURE YOU:  °Understand these   instructions.  °Will watch your condition.  °Will get help right away if you are not doing well or get worse. °Document Released: 09/15/2008 Document Revised: 08/31/2012 Document Reviewed: 08/14/2012  °ExitCare® Patient Information ©2015 [employer]. This information is not intended to replace advice given to you by your health care provider. Make sure you discuss any questions you have with your health care provider.  ° ° °Laceration Care, Adult  ° ° °A laceration is a cut that goes through all layers of the  skin. The cut goes into the tissue beneath the skin.  °HOME CARE  °For stitches (sutures) or staples:  °Keep the cut clean and dry.  °If you have a bandage (dressing), change it at least once a day. Change the bandage if it gets wet or dirty, or as told by your doctor.  °Wash the cut with soap and water 2 times a day. Rinse the cut with water. Pat it dry with a clean towel.  °Put a thin layer of medicated cream on the cut as told by your doctor.  °You may shower after the first 24 hours. Do not soak the cut in water until the stitches are removed.  °Only take medicines as told by your doctor.  °Have your stitches or staples removed as told by your doctor. °For skin adhesive strips:  °Keep the cut clean and dry.  °Do not get the strips wet. You may take a bath, but be careful to keep the cut dry.  °If the cut gets wet, pat it dry with a clean towel.  °The strips will fall off on their own. Do not remove the strips that are still stuck to the cut. °For wound glue:  °You may shower or take baths. Do not soak or scrub the cut. Do not swim. Avoid heavy sweating until the glue falls off on its own. After a shower or bath, pat the cut dry with a clean towel.  °Do not put medicine on your cut until the glue falls off.  °If you have a bandage, do not put tape over the glue.  °Avoid lots of sunlight or tanning lamps until the glue falls off. Put sunscreen on the cut for the first year to reduce your scar.  °The glue will fall off on its own. Do not pick at the glue. °You may need a tetanus shot if:  °You cannot remember when you had your last tetanus shot.  °You have never had a tetanus shot. °If you need a tetanus shot and you choose not to have one, you may get tetanus. Sickness from tetanus can be serious.  °GET HELP RIGHT AWAY IF:  °Your pain does not get better with medicine.  °Your arm, hand, leg, or foot loses feeling (numbness) or changes color.  °Your cut is bleeding.  °Your joint feels weak, or you cannot use your  joint.  °You have painful lumps on your body.  °Your cut is red, puffy (swollen), or painful.  °You have a red line on the skin near the cut.  °You have yellowish-white fluid (pus) coming from the cut.  °You have a fever.  °You have a bad smell coming from the cut or bandage.  °Your cut breaks open before or after stitches are removed.  °You notice something coming out of the cut, such as wood or glass.  °You cannot move a finger or toe. °MAKE SURE YOU:  °Understand these instructions.  °Will watch your condition.  °Will get help   right away if you are not doing well or get worse. °Document Released: 05/25/2008 Document Revised: 02/29/2012 Document Reviewed: 06/02/2011  °ExitCare® Patient Information ©2014 [employer].  ° ° °

## 2015-01-25 NOTE — Op Note (Signed)
PREOP DX: 1) lumbago  2) lumbar radiculopathy  3) lumbar post-laminectomy syndrome  4) chronic pain  POSTOP DX: 1) lumbago  2) lumbar radiculopathy  3) lumbar post-laminectomy syndrome  4) chronic pain PROCEDURES PERFORMED:1) intraop fluoro 2) placement of 2 16 contact [employer] Infinion leads 3) placement of Spectra SCS generator 4) post procedure SCS programming SURGEON:Kenitra Leventhal  ASSISTANT: NONE  ANESTHESIA: MAC  EBL: <20cc  DESCRIPTION OF PROCEDURE: After a discussion of risks, benefits and alternatives, informed consent was obtained. The patient was taken to the OR, turned prone onto a Jackson table, all pressure points padded, SCD's placed, and an adequate plane of anesthesia induced. A timeout was taken to verify the correct patient, position, personnel, availability of appropriate equipment, and administration of perioperative antibiotics.  The thoracic and lumbar areas were widely prepped with chloraprep and draped into a sterile field. Fluoroscopy was used to plan a left paramedian incision at the L1-L3 levels, and an incision made with a 10 blade and carried down to the dorsolumbar fascia with the bovie and blunt dissection. Retractors were placed and a 14g [employer] tuohy needle placed into the epidural space at the T12-L1 interspace using biplanar fluoro and loss-of-resistance technique. The needle was aspirated without any return of fluid. A [employer] INFINION lead was introduced and under live AP fluoro advanced until the distal-most 4 contacts overlay the mid-to- inferior aspect T7vertebral body shadow with the rest of the contacts distributed over the T8and T9 vertebral bodies in a position just right of anatomic midline. A second Infinion lead was placed just left of anatomic midline in the same levels using the same technique.  The patient was awakened and the leads tested; impedances were good, and the patient reported good coverage with amplitudes in the 5-7 mA range. 0 silk sutures were placed in the fascia adjacent to the needles. The needles and stylets were removed under fluoroscopy with no lead migration noted. Leads were then fixed to the fascia by chest tube-type fixation into position with the sutures; repeat images were obtained to verify that there had been no lead migration.  The incision was inspected and hemostasis obtained with the bipolar cautery.  Attention was then turned to creation of a subcutaneous pocket. At the left flank, a 3 cm incision was made with a 10 blade and using the bovie and blunt dissection a pocket of size appropriate to place a SCS generator. The pocket was trialed, and found to be of adequate size. The pocket was inspected for hemostasis, which was found to be excellent. Using reverse seldinger technique, the leads were tunneled to the pocket site, and the leads inserted into the SCS generator. Impedances were checked, and all found to be excellent. The leads were then all fixed into position with a self-torquing wrench. The wiring was all carefully coiled, placed behind the generator and placed in the pocket.  Both incisions were copiously irrigated with bacitracin-containing irrigation. The lumbar incision was closed in 2 deep layers of interrupted 2-0 vicryl and the skin closed with staples. The pocket incision was closed with a deeper layer of 2-0 vicryl interrupted sutures, and the skin closed with staples. Bacitracin ointment and sterile dressings were applied.   Needle, sponge, and instrument counts were correct x2 at the end of the case.    The patient was then carefully awakened from anesthesia, turned supine, an abdominal binder placed, and the patient taken to the recovery room where he underwent complex spinal cord stimulator programming.  COMPLICATIONS: NONE  CONDITION:  Stable throughout the course of the procedure and immediately afterward  DISPOSITION: discharge to home, with antibiotics and pain medicine. Discussed care with the patient and spouse. Followup in clinic will be scheduled in 10-14 days.

## 2015-01-25 NOTE — Anesthesia Preprocedure Evaluation (Addendum)
Anesthesia Evaluation  Patient identified by MRN, date of birth, ID band Patient awake    Reviewed: Allergy & Precautions, NPO status , Patient's Chart, lab work & pertinent test results  History of Anesthesia Complications Negative for: history of anesthetic complications  Airway Mallampati: II  TM Distance: >3 FB Neck ROM: Full    Dental  (+) Dental Advisory Given   Pulmonary COPDCurrent Smoker,  breath sounds clear to auscultation        Cardiovascular negative cardio ROS  Rhythm:Regular Rate:Normal     Neuro/Psych Anxiety Depression Chronic back pain: to L leg>R leg    GI/Hepatic Neg liver ROS, GERD-  Controlled,  Endo/Other  negative endocrine ROS  Renal/GU negative Renal ROS     Musculoskeletal  (+) Arthritis -,   Abdominal   Peds  Hematology negative hematology ROS (+)   Anesthesia Other Findings   Reproductive/Obstetrics                            Anesthesia Physical Anesthesia Plan  ASA: III  Anesthesia Plan: MAC   Post-op Pain Management:    Induction: Intravenous  Airway Management Planned: Simple Face Mask  Additional Equipment:   Intra-op Plan:   Post-operative Plan:   Informed Consent: I have reviewed the patients History and Physical, chart, labs and discussed the procedure including the risks, benefits and alternatives for the proposed anesthesia with the patient or authorized representative who has indicated his/her understanding and acceptance.   Dental advisory given  Plan Discussed with: CRNA and Surgeon  Anesthesia Plan Comments: (Plan routine monitors, MAC)        Anesthesia Quick Evaluation

## 2015-01-25 NOTE — Transfer of Care (Signed)
Immediate Anesthesia Transfer of Care Note  Patient: Katelyn Galloway  Procedure(s) Performed: Procedure(s): LUMBAR SPINAL CORD STIMULATOR INSERTION (N/A)  Patient Location: PACU  Anesthesia Type:MAC  Level of Consciousness: awake, alert , oriented and patient cooperative  Airway & Oxygen Therapy: Patient Spontanous Breathing and Patient connected to nasal cannula oxygen  Post-op Assessment: Report given to RN, Post -op Vital signs reviewed and stable and Patient moving all extremities  Post vital signs: Reviewed and stable  Last Vitals:  Filed Vitals:   01/25/15 0626  BP: 104/58  Pulse: 66  Resp: 18    Complications: No apparent anesthesia complications

## 2015-01-25 NOTE — Progress Notes (Signed)
Pt unable to get vaginal jewerly out

## 2015-01-28 ENCOUNTER — Encounter (HOSPITAL_COMMUNITY): Payer: Self-pay | Admitting: Anesthesiology

## 2015-05-15 DIAGNOSIS — E039 Hypothyroidism, unspecified: Secondary | ICD-10-CM | POA: Diagnosis present

## 2017-03-22 ENCOUNTER — Encounter (HOSPITAL_COMMUNITY): Payer: Self-pay | Admitting: Emergency Medicine

## 2017-03-22 ENCOUNTER — Emergency Department (HOSPITAL_COMMUNITY)
Admission: EM | Admit: 2017-03-22 | Discharge: 2017-03-23 | Disposition: A | Discharge status: Home / Self Care | Payer: Medicare Other | Attending: Emergency Medicine | Admitting: Emergency Medicine

## 2017-03-22 DIAGNOSIS — J45909 Unspecified asthma, uncomplicated: Secondary | ICD-10-CM | POA: Diagnosis not present

## 2017-03-22 DIAGNOSIS — Z79899 Other long term (current) drug therapy: Secondary | ICD-10-CM | POA: Diagnosis not present

## 2017-03-22 DIAGNOSIS — F17210 Nicotine dependence, cigarettes, uncomplicated: Secondary | ICD-10-CM | POA: Diagnosis not present

## 2017-03-22 DIAGNOSIS — F1210 Cannabis abuse, uncomplicated: Secondary | ICD-10-CM | POA: Diagnosis not present

## 2017-03-22 DIAGNOSIS — F330 Major depressive disorder, recurrent, mild: Secondary | ICD-10-CM | POA: Diagnosis not present

## 2017-03-22 DIAGNOSIS — R45851 Suicidal ideations: Secondary | ICD-10-CM | POA: Diagnosis present

## 2017-03-22 LAB — CBC
HCT: 41.2 % (ref 36.0–46.0)
HEMOGLOBIN: 14 g/dL (ref 12.0–15.0)
MCH: 31.2 pg (ref 26.0–34.0)
MCHC: 34 g/dL (ref 30.0–36.0)
MCV: 91.8 fL (ref 78.0–100.0)
Platelets: 317 10*3/uL (ref 150–400)
RBC: 4.49 MIL/uL (ref 3.87–5.11)
RDW: 13.1 % (ref 11.5–15.5)
WBC: 7.6 10*3/uL (ref 4.0–10.5)

## 2017-03-22 LAB — SALICYLATE LEVEL: Salicylate Lvl: <7 mg/dL (ref 2.8–30.0)

## 2017-03-22 LAB — COMPREHENSIVE METABOLIC PANEL
ALT: 18 U/L (ref 14–54)
AST: 19 U/L (ref 15–41)
Albumin: 3.8 g/dL (ref 3.5–5.0)
Alkaline Phosphatase: 83 U/L (ref 38–126)
Anion gap: 8 (ref 5–15)
BUN: 8 mg/dL (ref 6–20)
CHLORIDE: 108 mmol/L (ref 101–111)
CO2: 24 mmol/L (ref 22–32)
Calcium: 9.1 mg/dL (ref 8.9–10.3)
Creatinine, Ser: 0.7 mg/dL (ref 0.44–1.00)
GFR calc Af Amer: >60 mL/min (ref >60)
GFR calc non Af Amer: >60 mL/min (ref >60)
Glucose, Bld: 97 mg/dL (ref 65–99)
Potassium: 3.9 mmol/L (ref 3.5–5.1)
Sodium: 140 mmol/L (ref 135–145)
Total Bilirubin: 0.2 mg/dL — ABNORMAL LOW (ref 0.3–1.2)
Total Protein: 7.3 g/dL (ref 6.5–8.1)

## 2017-03-22 LAB — ETHANOL: Alcohol, Ethyl (B): <5 mg/dL (ref <5)

## 2017-03-22 LAB — ACETAMINOPHEN LEVEL: Acetaminophen (Tylenol), Serum: <10 ug/mL — ABNORMAL LOW (ref 10–30)

## 2017-03-22 LAB — CARBAMAZEPINE LEVEL, TOTAL: Carbamazepine Lvl: 5.2 ug/mL (ref 4.0–12.0)

## 2017-03-22 MED ORDER — CARBAMAZEPINE 200 MG PO TABS: 100.0000 mg | ORAL_TABLET | Freq: Two times a day (BID) | ORAL | Status: DC

## 2017-03-22 MED ORDER — POLYVINYL ALCOHOL 1.4 % OP SOLN
1.0000 [drp] | Freq: Every day | OPHTHALMIC | Status: DC
  Administered 2017-03-22: 1 [drp] via OPHTHALMIC
  Filled 2017-03-22: qty 15

## 2017-03-22 MED ORDER — ACETAMINOPHEN 325 MG PO TABS: 650.0000 mg | ORAL_TABLET | ORAL | Status: DC | PRN

## 2017-03-22 MED ORDER — MORPHINE SULFATE ER 15 MG PO TBCR
15.0000 mg | EXTENDED_RELEASE_TABLET | Freq: Two times a day (BID) | ORAL | Status: DC
  Administered 2017-03-22: 15 mg via ORAL
  Filled 2017-03-22: qty 1

## 2017-03-22 MED ORDER — MUSCLE RUB 10-15 % EX CREA
1.0000 "application " | TOPICAL_CREAM | Freq: Every day | CUTANEOUS | Status: DC | PRN
  Filled 2017-03-22: qty 85

## 2017-03-22 MED ORDER — DICYCLOMINE HCL 20 MG PO TABS: 20.0000 mg | ORAL_TABLET | Freq: Four times a day (QID) | ORAL | Status: DC | PRN

## 2017-03-22 MED ORDER — CARBAMAZEPINE 200 MG PO TABS
200.0000 mg | ORAL_TABLET | Freq: Every day | ORAL | Status: DC
  Administered 2017-03-22: 200 mg via ORAL
  Filled 2017-03-22: qty 1

## 2017-03-22 MED ORDER — DULOXETINE HCL 30 MG PO CPEP
30.0000 mg | ORAL_CAPSULE | Freq: Two times a day (BID) | ORAL | Status: DC
  Administered 2017-03-22 – 2017-03-23 (×2): 30 mg via ORAL
  Filled 2017-03-22 (×2): qty 1

## 2017-03-22 MED ORDER — MELOXICAM 15 MG PO TABS
15.0000 mg | ORAL_TABLET | Freq: Every day | ORAL | Status: DC
Start: 2017-03-23 — End: 2017-03-23
  Administered 2017-03-23: 15 mg via ORAL
  Filled 2017-03-22: qty 1

## 2017-03-22 MED ORDER — PREGABALIN 50 MG PO CAPS: 225.0000 mg | ORAL_CAPSULE | Freq: Two times a day (BID) | ORAL | Status: DC

## 2017-03-22 MED ORDER — FLUOXETINE HCL 20 MG PO CAPS
20.0000 mg | ORAL_CAPSULE | Freq: Every day | ORAL | Status: DC
  Administered 2017-03-22: 20 mg via ORAL
  Filled 2017-03-22: qty 1

## 2017-03-22 MED ORDER — QUETIAPINE FUMARATE 50 MG PO TABS
50.0000 mg | ORAL_TABLET | Freq: Every day | ORAL | Status: DC
  Administered 2017-03-22: 50 mg via ORAL
  Filled 2017-03-22: qty 1

## 2017-03-22 MED ORDER — DIAZEPAM 5 MG PO TABS
10.0000 mg | ORAL_TABLET | Freq: Two times a day (BID) | ORAL | Status: DC
  Administered 2017-03-22: 10 mg via ORAL
  Filled 2017-03-22: qty 2

## 2017-03-22 MED ORDER — IBUPROFEN 200 MG PO TABS: 600.0000 mg | ORAL_TABLET | Freq: Three times a day (TID) | ORAL | Status: DC | PRN

## 2017-03-22 MED ORDER — CITALOPRAM HYDROBROMIDE 10 MG PO TABS: 40.0000 mg | ORAL_TABLET | Freq: Every day | ORAL | Status: DC

## 2017-03-22 MED ORDER — GABAPENTIN 300 MG PO CAPS
600.0000 mg | ORAL_CAPSULE | Freq: Three times a day (TID) | ORAL | Status: DC
Start: 2017-03-22 — End: 2017-03-23
  Administered 2017-03-22 – 2017-03-23 (×2): 600 mg via ORAL
  Filled 2017-03-22 (×2): qty 2

## 2017-03-22 MED ORDER — HYDROXYZINE HCL 25 MG PO TABS
25.0000 mg | ORAL_TABLET | Freq: Every day | ORAL | Status: DC | PRN
Start: 2017-03-22 — End: 2017-03-23
  Administered 2017-03-23: 25 mg via ORAL
  Filled 2017-03-22: qty 1

## 2017-03-22 MED ORDER — LEVOTHYROXINE SODIUM 25 MCG PO TABS
25.0000 ug | ORAL_TABLET | Freq: Every day | ORAL | Status: DC
  Administered 2017-03-23: 25 ug via ORAL
  Filled 2017-03-22: qty 1

## 2017-03-22 MED ORDER — DIAZEPAM 5 MG PO TABS: 5.0000 mg | ORAL_TABLET | Freq: Two times a day (BID) | ORAL | Status: DC

## 2017-03-22 MED ORDER — ALUM & MAG HYDROXIDE-SIMETH 200-200-20 MG/5ML PO SUSP: 30.0000 mL | ORAL | Status: DC | PRN

## 2017-03-22 MED ORDER — HYPROMELLOSE (GONIOSCOPIC) 2.5 % OP SOLN: 1.0000 [drp] | Freq: Every day | OPHTHALMIC | Status: DC

## 2017-03-22 MED ORDER — ONDANSETRON HCL 4 MG PO TABS: 4.0000 mg | ORAL_TABLET | Freq: Three times a day (TID) | ORAL | Status: DC | PRN

## 2017-03-22 MED ORDER — PRAZOSIN HCL 2 MG PO CAPS
2.0000 mg | ORAL_CAPSULE | Freq: Every day | ORAL | Status: DC
  Administered 2017-03-22: 2 mg via ORAL
  Filled 2017-03-22: qty 1

## 2017-03-22 MED ORDER — CEPHALEXIN 500 MG PO CAPS: 500.0000 mg | ORAL_CAPSULE | Freq: Three times a day (TID) | ORAL | Status: DC

## 2017-03-22 MED ORDER — ALBUTEROL SULFATE HFA 108 (90 BASE) MCG/ACT IN AERS
1.0000 | INHALATION_SPRAY | RESPIRATORY_TRACT | Status: DC
  Administered 2017-03-22: 2 via RESPIRATORY_TRACT
  Filled 2017-03-22: qty 6.7

## 2017-03-22 NOTE — ED Notes (Signed)
Pt attempted to give urine sample, but was unable to.  

## 2017-03-22 NOTE — Progress Notes (Signed)
Per Karleen Hampshire, [place] meets inpatient criteria Emmanuel Gruenhagen K. Sherlon Handing, LPC-A, [facility]  Counselor 03/22/2017 9:10 PM

## 2017-03-22 NOTE — ED Provider Notes (Signed)
[facility]-EMERGENCY DEPT Provider Note   CSN: [id number] Arrival date & time: 03/22/17  1753     History   Chief Complaint Chief Complaint  Patient presents with  . Anxiety    HPI Katelyn Galloway is a 56 y.o. female.  56 year old female with history of anxiety and depression presents with worsening symptoms of hopelessness as well as vague suicidal ideations without a plan. Denies any homicidal ideations. Denies any hallucinations. She has not been responding to internal stimuli. States that she has had increasing crying episodes since her medications were adjusted by her old psychiatrist. She scheduled to see a new psychiatrist within the coming weeks. Doesn't feel like she can make it to that appointment. Symptoms have been progressively worse over last few days and associated with increased sleep. Denies any intentional ingestions at this time.      Past Medical History:  Diagnosis Date  . Anemia 1970  . Anxiety   . Arthritis   . Asthma    child  . Bronchitis    "years ago"  . Bronchitis    chronic hx  . Chronic back pain    hx of  . Depression   . GERD (gastroesophageal reflux disease)    occ  . Headache(784.0)    migraines  . Heart palpitations    occ if get anxious- no tests  . History of kidney stones   . Irritable bowel syndrome (IBS)   . Seizures (HCC)    x 2 during pregnancy per pt was not diagnosed with eclampsia    There are no active problems to display for this patient.   Past Surgical History:  Procedure Laterality Date  . ABDOMINAL HYSTERECTOMY  1992  . BACK SURGERY     x2  . CHOLECYSTECTOMY  2009 or 2010  . HARDWARE REMOVAL  12/29/2012   Procedure: HARDWARE REMOVAL;  Surgeon: Tia Alert, MD;  Location: [facility];  Service: Neurosurgery;  Laterality: N/A;  Lumbar hardware extraction  . kindey stone removal    . KNEE ARTHROSCOPY     right knee  . SPINAL CORD STIMULATOR INSERTION N/A 01/25/2015   Procedure: LUMBAR SPINAL CORD STIMULATOR  INSERTION;  Surgeon: Gwynne Edinger, MD;  Location: [facility];  Service: Neurosurgery;  Laterality: N/A;  . TUBAL LIGATION      OB History    No data available       Home Medications    Prior to Admission medications   Medication Sig Start Date End Date Taking? Authorizing Provider  BIOTIN PO Take 2 tablets by mouth daily.    Historical Provider, MD  carbamazepine (TEGRETOL) 200 MG tablet Take 100 mg by mouth 2 (two) times daily.    Historical Provider, MD  carisoprodol (SOMA) 350 MG tablet Take 350 mg by mouth 2 (two) times daily. spasm    Historical Provider, MD  cephALEXin (KEFLEX) 500 MG capsule Take 1 capsule (500 mg total) by mouth 3 (three) times daily. 01/25/15   Odette Fraction, MD  Cholecalciferol (VITAMIN D3) 5000 UNITS CAPS Take 5,000 Units by mouth daily.    Historical Provider, MD  citalopram (CELEXA) 40 MG tablet Take 40 mg by mouth daily.    Historical Provider, MD  diazepam (VALIUM) 5 MG tablet Take 5-10 mg by mouth 2 (two) times daily. 1 tablet every morning and 2 tablets every evening    Historical Provider, MD  dicyclomine (BENTYL) 20 MG tablet Take 20 mg by mouth 4 (four) times daily as  needed. For irritable bowel syndrome     Historical Provider, MD  FIBER PO Take 2 tablets by mouth daily.    Historical Provider, MD  hydroxypropyl methylcellulose (ISOPTO TEARS) 2.5 % ophthalmic solution Place 1 drop into both eyes at bedtime.    Historical Provider, MD  hydrOXYzine (ATARAX/VISTARIL) 25 MG tablet Take 25 mg by mouth daily as needed for anxiety.     Historical Provider, MD  meloxicam (MOBIC) 7.5 MG tablet Take 7.5 mg by mouth 2 (two) times daily.    Historical Provider, MD  Menthol-Methyl Salicylate (MUSCLE RUB) 10-15 % CREA Apply 1 application topically daily as needed. Joint pain    Historical Provider, MD  morphine (MS CONTIN) 15 MG 12 hr tablet Take 15 mg by mouth every 12 (twelve) hours.    Historical Provider, MD  oxyCODONE (ROXICODONE) 15 MG immediate release  tablet Take 15 mg by mouth every 4 (four) hours as needed for pain (for breakthrough pain).    Historical Provider, MD  oxyCODONE (ROXICODONE) 5 MG immediate release tablet Take 1 tablet (5 mg total) by mouth every 4 (four) hours as needed for severe pain (if needed in addition to previous prescription). 01/25/15   Odette Fraction, MD  Polyvinyl Alcohol-Povidone (REFRESH OP) Apply 1 drop to eye as needed (dry eyes).    Historical Provider, MD  pregabalin (LYRICA) 225 MG capsule Take 225 mg by mouth 2 (two) times daily.    Historical Provider, MD  Probiotic Product (PHILLIPS COLON HEALTH PO) Take 1 capsule by mouth daily.    Historical Provider, MD  Wheat Dextrin (BENEFIBER PO) Take 1 packet by mouth daily. miralax    Historical Provider, MD    Family History Family History  Problem Relation Age of Onset  . Anesthesia problems Neg Hx     Social History Social History  Substance Use Topics  . Smoking status: Current Some Day Smoker    Packs/day: 0.25    Years: 38.00    Types: Cigarettes  . Smokeless tobacco: Never Used     Comment: Pt reports using an electric cigarette ~ 2 times a day  . Alcohol use 0.6 oz/week    1 Glasses of wine per week     Allergies   Iodine and Tape   Review of Systems Review of Systems  All other systems reviewed and are negative.    Physical Exam Updated Vital Signs BP 139/90 (BP Location: Right Arm)   Pulse 94   Temp 98.5 F (36.9 C) (Oral)   Resp 20   SpO2 97%   Physical Exam  Constitutional: She is oriented to person, place, and time. She appears well-developed and well-nourished.  Non-toxic appearance. No distress.  HENT:  Head: Normocephalic and atraumatic.  Eyes: Conjunctivae, EOM and lids are normal. Pupils are equal, round, and reactive to light.  Neck: Normal range of motion. Neck supple. No tracheal deviation present. No thyroid mass present.  Cardiovascular: Normal rate, regular rhythm and normal heart sounds.  Exam reveals no gallop.    No murmur heard. Pulmonary/Chest: Effort normal and breath sounds normal. No stridor. No respiratory distress. She has no decreased breath sounds. She has no wheezes. She has no rhonchi. She has no rales.  Abdominal: Soft. Normal appearance and bowel sounds are normal. She exhibits no distension. There is no tenderness. There is no rebound and no CVA tenderness.  Musculoskeletal: Normal range of motion. She exhibits no edema or tenderness.  Neurological: She is alert and oriented to person,  place, and time. She has normal strength. No cranial nerve deficit or sensory deficit. GCS eye subscore is 4. GCS verbal subscore is 5. GCS motor subscore is 6.  Skin: Skin is warm and dry. No abrasion and no rash noted.  Psychiatric: Her speech is normal. Her affect is blunt. She is withdrawn. She is not actively hallucinating. Thought content is not delusional. She exhibits a depressed mood. She expresses suicidal ideation. She expresses no suicidal plans.  Nursing note and vitals reviewed.    ED Treatments / Results  Labs (all labs ordered are listed, but only abnormal results are displayed) Labs Reviewed  COMPREHENSIVE METABOLIC PANEL  ETHANOL  SALICYLATE LEVEL  ACETAMINOPHEN LEVEL  CBC  RAPID URINE DRUG SCREEN, HOSP PERFORMED    EKG  EKG Interpretation None       Radiology No results found.  Procedures Procedures (including critical care time)  Medications Ordered in ED Medications  ondansetron (ZOFRAN) tablet 4 mg (not administered)  alum & mag hydroxide-simeth (MAALOX/MYLANTA) 200-200-20 MG/5ML suspension 30 mL (not administered)  acetaminophen (TYLENOL) tablet 650 mg (not administered)  ibuprofen (ADVIL,MOTRIN) tablet 600 mg (not administered)  carbamazepine (TEGRETOL) tablet 100 mg (not administered)  cephALEXin (KEFLEX) capsule 500 mg (not administered)  citalopram (CELEXA) tablet 40 mg (not administered)  diazepam (VALIUM) tablet 5-10 mg (not administered)  dicyclomine  (BENTYL) tablet 20 mg (not administered)  hydroxypropyl methylcellulose / hypromellose (ISOPTO TEARS / GONIOVISC) 2.5 % ophthalmic solution 1 drop (not administered)  hydrOXYzine (ATARAX/VISTARIL) tablet 25 mg (not administered)  meloxicam (MOBIC) tablet 7.5 mg (not administered)  pregabalin (LYRICA) capsule 225 mg (not administered)  MUSCLE RUB CREA 1 application (not administered)  morphine (MS CONTIN) 12 hr tablet 15 mg (not administered)     Initial Impression / Assessment and Plan / ED Course  I have reviewed the triage vital signs and the nursing notes.  Pertinent labs & imaging results that were available during my care of the patient were reviewed by me and considered in my medical decision making (see chart for details).   patient's labs are pending at this time but anticipate that she'll be medically clear for psychiatric disposition.  Final Clinical Impressions(s) / ED Diagnoses   Final diagnoses:  None    New Prescriptions New Prescriptions   No medications on file     Lorre Nick, MD 03/22/17 1954

## 2017-03-22 NOTE — ED Triage Notes (Signed)
Pt c/o anxiety attack onset February. No SI/HI/AVH.

## 2017-03-22 NOTE — ED Notes (Signed)
TTS is seeing pt at this time
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
## 2017-03-22 NOTE — ED Notes (Signed)
Patient states she recently had medication adjusted and has been tearful and upset since. Patient crying during this assessment. Patient states she prays at night that she will die. Patient denies having a plan for suicide, but has thoughts. Patient cooperative and tearful at this time.

## 2017-03-23 ENCOUNTER — Inpatient Hospital Stay
Admission: AD | Admit: 2017-03-23 | Discharge: 2017-03-29 | DRG: 885 | Disposition: A | Discharge status: Home / Self Care | Payer: Medicare Other | Source: Intra-hospital | Attending: Psychiatry | Admitting: Psychiatry

## 2017-03-23 ENCOUNTER — Inpatient Hospital Stay: Admission: AD | Admit: 2017-03-23 | Payer: Self-pay | Source: Intra-hospital | Admitting: Psychiatry

## 2017-03-23 ENCOUNTER — Encounter: Payer: Self-pay | Admitting: Emergency Medicine

## 2017-03-23 ENCOUNTER — Emergency Department
Admission: EM | Admit: 2017-03-23 | Discharge: 2017-03-23 | Disposition: A | Discharge status: Other Acute Inpatient Hospital | Payer: Medicare Other | Attending: Emergency Medicine | Admitting: Emergency Medicine

## 2017-03-23 DIAGNOSIS — F329 Major depressive disorder, single episode, unspecified: Secondary | ICD-10-CM | POA: Diagnosis not present

## 2017-03-23 DIAGNOSIS — Z9049 Acquired absence of other specified parts of digestive tract: Secondary | ICD-10-CM | POA: Diagnosis not present

## 2017-03-23 DIAGNOSIS — F1210 Cannabis abuse, uncomplicated: Secondary | ICD-10-CM | POA: Diagnosis present

## 2017-03-23 DIAGNOSIS — F330 Major depressive disorder, recurrent, mild: Secondary | ICD-10-CM | POA: Diagnosis not present

## 2017-03-23 DIAGNOSIS — Z79891 Long term (current) use of opiate analgesic: Secondary | ICD-10-CM

## 2017-03-23 DIAGNOSIS — R45851 Suicidal ideations: Secondary | ICD-10-CM | POA: Diagnosis present

## 2017-03-23 DIAGNOSIS — M797 Fibromyalgia: Secondary | ICD-10-CM | POA: Diagnosis present

## 2017-03-23 DIAGNOSIS — Z79899 Other long term (current) drug therapy: Secondary | ICD-10-CM

## 2017-03-23 DIAGNOSIS — Z90710 Acquired absence of both cervix and uterus: Secondary | ICD-10-CM

## 2017-03-23 DIAGNOSIS — Z9689 Presence of other specified functional implants: Secondary | ICD-10-CM | POA: Diagnosis present

## 2017-03-23 DIAGNOSIS — F17210 Nicotine dependence, cigarettes, uncomplicated: Secondary | ICD-10-CM

## 2017-03-23 DIAGNOSIS — F4310 Post-traumatic stress disorder, unspecified: Secondary | ICD-10-CM | POA: Diagnosis present

## 2017-03-23 DIAGNOSIS — J45909 Unspecified asthma, uncomplicated: Secondary | ICD-10-CM | POA: Diagnosis present

## 2017-03-23 DIAGNOSIS — K219 Gastro-esophageal reflux disease without esophagitis: Secondary | ICD-10-CM | POA: Diagnosis present

## 2017-03-23 DIAGNOSIS — F3130 Bipolar disorder, current episode depressed, mild or moderate severity, unspecified: Secondary | ICD-10-CM | POA: Diagnosis present

## 2017-03-23 DIAGNOSIS — F332 Major depressive disorder, recurrent severe without psychotic features: Secondary | ICD-10-CM

## 2017-03-23 DIAGNOSIS — F3132 Bipolar disorder, current episode depressed, moderate: Secondary | ICD-10-CM | POA: Diagnosis not present

## 2017-03-23 DIAGNOSIS — E559 Vitamin D deficiency, unspecified: Secondary | ICD-10-CM | POA: Diagnosis present

## 2017-03-23 DIAGNOSIS — M545 Low back pain, unspecified: Secondary | ICD-10-CM | POA: Diagnosis present

## 2017-03-23 DIAGNOSIS — F3181 Bipolar II disorder: Principal | ICD-10-CM | POA: Diagnosis present

## 2017-03-23 DIAGNOSIS — F411 Generalized anxiety disorder: Secondary | ICD-10-CM | POA: Diagnosis present

## 2017-03-23 DIAGNOSIS — K589 Irritable bowel syndrome without diarrhea: Secondary | ICD-10-CM | POA: Diagnosis present

## 2017-03-23 DIAGNOSIS — F17200 Nicotine dependence, unspecified, uncomplicated: Secondary | ICD-10-CM

## 2017-03-23 DIAGNOSIS — E039 Hypothyroidism, unspecified: Secondary | ICD-10-CM | POA: Diagnosis present

## 2017-03-23 DIAGNOSIS — G8929 Other chronic pain: Secondary | ICD-10-CM | POA: Diagnosis present

## 2017-03-23 DIAGNOSIS — F603 Borderline personality disorder: Secondary | ICD-10-CM

## 2017-03-23 LAB — COMPREHENSIVE METABOLIC PANEL
ALBUMIN: 4.2 g/dL (ref 3.5–5.0)
ALK PHOS: 84 U/L (ref 38–126)
ALT: 17 U/L (ref 14–54)
ANION GAP: 7 (ref 5–15)
AST: 23 U/L (ref 15–41)
BILIRUBIN TOTAL: 0.4 mg/dL (ref 0.3–1.2)
BUN: 13 mg/dL (ref 6–20)
CALCIUM: 9 mg/dL (ref 8.9–10.3)
CO2: 27 mmol/L (ref 22–32)
Chloride: 100 mmol/L — ABNORMAL LOW (ref 101–111)
Creatinine, Ser: 0.83 mg/dL (ref 0.44–1.00)
GFR calc Af Amer: >60 mL/min (ref >60)
GLUCOSE: 100 mg/dL — AB (ref 65–99)
POTASSIUM: 3.7 mmol/L (ref 3.5–5.1)
Sodium: 134 mmol/L — ABNORMAL LOW (ref 135–145)
TOTAL PROTEIN: 7.7 g/dL (ref 6.5–8.1)

## 2017-03-23 LAB — URINE DRUG SCREEN, QUALITATIVE (ARMC ONLY)
AMPHETAMINES, UR SCREEN: NOT DETECTED
BARBITURATES, UR SCREEN: NOT DETECTED
BENZODIAZEPINE, UR SCRN: POSITIVE — AB
Cannabinoid 50 Ng, Ur ~~LOC~~: POSITIVE — AB
Cocaine Metabolite,Ur ~~LOC~~: NOT DETECTED
MDMA (Ecstasy)Ur Screen: NOT DETECTED
METHADONE SCREEN, URINE: NOT DETECTED
Opiate, Ur Screen: POSITIVE — AB
Phencyclidine (PCP) Ur S: NOT DETECTED
TRICYCLIC, UR SCREEN: NOT DETECTED

## 2017-03-23 LAB — CBC
HEMATOCRIT: 41.1 % (ref 35.0–47.0)
Hemoglobin: 14 g/dL (ref 12.0–16.0)
MCH: 31.3 pg (ref 26.0–34.0)
MCHC: 34.1 g/dL (ref 32.0–36.0)
MCV: 91.7 fL (ref 80.0–100.0)
PLATELETS: 290 10*3/uL (ref 150–440)
RBC: 4.48 MIL/uL (ref 3.80–5.20)
RDW: 13.6 % (ref 11.5–14.5)
WBC: 7 10*3/uL (ref 3.6–11.0)

## 2017-03-23 LAB — RAPID URINE DRUG SCREEN, HOSP PERFORMED
Amphetamines: NOT DETECTED
BENZODIAZEPINES: POSITIVE — AB
Barbiturates: NOT DETECTED
Cocaine: NOT DETECTED
OPIATES: POSITIVE — AB
Tetrahydrocannabinol: POSITIVE — AB

## 2017-03-23 LAB — SALICYLATE LEVEL: Salicylate Lvl: <7 mg/dL (ref 2.8–30.0)

## 2017-03-23 LAB — ACETAMINOPHEN LEVEL: Acetaminophen (Tylenol), Serum: <10 ug/mL — ABNORMAL LOW (ref 10–30)

## 2017-03-23 LAB — ETHANOL

## 2017-03-23 MED ORDER — GABAPENTIN 300 MG PO CAPS
600.0000 mg | ORAL_CAPSULE | Freq: Three times a day (TID) | ORAL | Status: DC
  Administered 2017-03-23 (×2): 600 mg via ORAL
  Filled 2017-03-23 (×2): qty 2

## 2017-03-23 MED ORDER — NICOTINE 21 MG/24HR TD PT24
21.0000 mg | MEDICATED_PATCH | Freq: Once | TRANSDERMAL | Status: DC
  Administered 2017-03-23: 21 mg via TRANSDERMAL
  Filled 2017-03-23: qty 1

## 2017-03-23 MED ORDER — LEVOTHYROXINE SODIUM 50 MCG PO TABS: 25.0000 ug | ORAL_TABLET | Freq: Every day | ORAL | Status: DC

## 2017-03-23 MED ORDER — VENLAFAXINE HCL ER 150 MG PO CP24: 150.0000 mg | ORAL_CAPSULE | Freq: Every day | ORAL | Status: DC

## 2017-03-23 MED ORDER — NICOTINE 21 MG/24HR TD PT24
MEDICATED_PATCH | TRANSDERMAL | Status: AC
  Administered 2017-03-23: 21 mg via TRANSDERMAL
  Filled 2017-03-23: qty 1

## 2017-03-23 MED ORDER — MAGNESIUM HYDROXIDE 400 MG/5ML PO SUSP
30.0000 mL | Freq: Every day | ORAL | Status: DC | PRN
Start: 2017-03-23 — End: 2017-03-29
  Filled 2017-03-23: qty 30

## 2017-03-23 MED ORDER — ALBUTEROL SULFATE HFA 108 (90 BASE) MCG/ACT IN AERS
2.0000 | INHALATION_SPRAY | RESPIRATORY_TRACT | Status: DC | PRN
  Filled 2017-03-23: qty 6.7

## 2017-03-23 MED ORDER — FLUOXETINE HCL 20 MG PO CAPS
20.0000 mg | ORAL_CAPSULE | Freq: Every day | ORAL | Status: DC
  Administered 2017-03-23: 20 mg via ORAL
  Filled 2017-03-23: qty 1

## 2017-03-23 MED ORDER — NICOTINE 21 MG/24HR TD PT24
21.0000 mg | MEDICATED_PATCH | Freq: Once | TRANSDERMAL | Status: DC
  Administered 2017-03-23: 21 mg via TRANSDERMAL

## 2017-03-23 MED ORDER — ALBUTEROL SULFATE HFA 108 (90 BASE) MCG/ACT IN AERS: 1.0000 | INHALATION_SPRAY | RESPIRATORY_TRACT | Status: DC | PRN

## 2017-03-23 MED ORDER — MORPHINE SULFATE ER 15 MG PO TBCR
15.0000 mg | EXTENDED_RELEASE_TABLET | Freq: Two times a day (BID) | ORAL | Status: DC
  Administered 2017-03-23: 15 mg via ORAL
  Filled 2017-03-23: qty 1

## 2017-03-23 MED ORDER — VENLAFAXINE HCL ER 75 MG PO CP24
150.0000 mg | ORAL_CAPSULE | Freq: Every day | ORAL | Status: DC
  Administered 2017-03-24 – 2017-03-29 (×6): 150 mg via ORAL
  Filled 2017-03-23 (×6): qty 2

## 2017-03-23 MED ORDER — ALUM & MAG HYDROXIDE-SIMETH 200-200-20 MG/5ML PO SUSP: 30.0000 mL | ORAL | Status: DC | PRN

## 2017-03-23 MED ORDER — MELOXICAM 15 MG PO TABS
15.0000 mg | ORAL_TABLET | Freq: Every day | ORAL | Status: DC
  Administered 2017-03-23: 15 mg via ORAL
  Filled 2017-03-23: qty 1

## 2017-03-23 MED ORDER — PRAZOSIN HCL 1 MG PO CAPS
2.0000 mg | ORAL_CAPSULE | Freq: Every day | ORAL | Status: DC
  Administered 2017-03-23: 2 mg via ORAL
  Filled 2017-03-23: qty 2

## 2017-03-23 MED ORDER — DIAZEPAM 5 MG PO TABS
5.0000 mg | ORAL_TABLET | Freq: Three times a day (TID) | ORAL | Status: DC
  Administered 2017-03-23 (×2): 5 mg via ORAL
  Filled 2017-03-23 (×2): qty 1

## 2017-03-23 MED ORDER — CARBAMAZEPINE 200 MG PO TABS
200.0000 mg | ORAL_TABLET | Freq: Every day | ORAL | Status: DC
  Administered 2017-03-23: 200 mg via ORAL
  Filled 2017-03-23: qty 1

## 2017-03-23 MED ORDER — ACETAMINOPHEN 325 MG PO TABS
650.0000 mg | ORAL_TABLET | Freq: Four times a day (QID) | ORAL | Status: DC | PRN
  Administered 2017-03-24 – 2017-03-26 (×2): 650 mg via ORAL
  Filled 2017-03-23 (×2): qty 2

## 2017-03-23 MED ORDER — DULOXETINE HCL 30 MG PO CPEP
30.0000 mg | ORAL_CAPSULE | Freq: Two times a day (BID) | ORAL | Status: DC
  Administered 2017-03-23: 30 mg via ORAL
  Filled 2017-03-23: qty 1

## 2017-03-23 NOTE — BHH Suicide Risk Assessment (Signed)
Suicide Risk Assessment  Discharge Assessment   [facility] Discharge Suicide Risk Assessment   Principal Problem: Major depressive disorder, recurrent episode, mild (HCC) Discharge Diagnoses:  Patient Active Problem List   Diagnosis Date Noted  . Major depressive disorder, recurrent episode, mild (HCC) [F33.0] 03/23/2017    Priority: High  . Cannabis abuse [F12.10] 03/23/2017    Priority: High    Total Time spent with patient: 45 minutes   Musculoskeletal: Strength & Muscle Tone: within normal limits Gait & Station: normal Patient leans: N/A  Psychiatric Specialty Exam: Physical Exam  Constitutional: She is oriented to person, place, and time. She appears well-developed and well-nourished.  HENT:  Head: Normocephalic.  Neck: Normal range of motion.  Respiratory: Effort normal.  Musculoskeletal: Normal range of motion.  Neurological: She is alert and oriented to person, place, and time.  Psychiatric: Her speech is normal and behavior is normal. Judgment and thought content normal. Cognition and memory are normal. She exhibits a depressed mood.    Review of Systems  Psychiatric/Behavioral: Positive for depression and substance abuse.  All other systems reviewed and are negative.   Blood pressure 106/65, pulse 68, temperature 97.9 F (36.6 C), temperature source Oral, resp. rate 16, SpO2 100 %.There is no height or weight on file to calculate BMI.  General Appearance: Casual  Eye Contact:  Good  Speech:  Normal Rate  Volume:  Normal  Mood:  Depressed, mild  Affect:  Congruent  Thought Process:  Coherent and Descriptions of Associations: Intact  Orientation:  Full (Time, Place, and Person)  Thought Content:  WDL and Logical  Suicidal Thoughts:  No  Homicidal Thoughts:  No  Memory:  Immediate;   Good Recent;   Good Remote;   Good  Judgement:  Fair  Insight:  Fair  Psychomotor Activity:  Normal  Concentration:  Concentration: Good and Attention Span: Good  Recall:  Good   Fund of Knowledge:  Fair  Language:  Good  Akathisia:  No  Handed:  Right  AIMS (if indicated):     Assets:  Housing Leisure Time Physical Health Resilience Social Support  ADL's:  Intact  Cognition:  WNL  Sleep:      Mental Status Per Nursing Assessment::   On Admission:   depression with suicidal ideations  Demographic Factors:  Caucasian  Loss Factors: NA  Historical Factors: NA  Risk Reduction Factors:   Sense of responsibility to family, Living with another person, especially a relative, Positive social support and Positive therapeutic relationship  Continued Clinical Symptoms:  Depression, mild  Cognitive Features That Contribute To Risk:  None    Suicide Risk:  Minimal: No identifiable suicidal ideation.  Patients presenting with no risk factors but with morbid ruminations; may be classified as minimal risk based on the severity of the depressive symptoms    Plan Of Care/Follow-up recommendations:  Activity:  as tolerated Diet:  heart healthy diet  Danell Verno, NP 03/23/2017, 10:44 AM

## 2017-03-23 NOTE — ED Notes (Signed)

## 2017-03-23 NOTE — BH Assessment (Signed)
BHH Assessment Progress Note  Per Thedore Mins, MD, this pt does not require psychiatric hospitalization at this time.  Pt is to be discharged from [facility] with recommendation to follow up with Milagros Evener, MD.  She has an appointment scheduled for Monday 04/26/2017 at 13:45.  This has been included in pt's discharge instructions.  Pt's nurse, Kendal Hymen, has been notified.  Doylene Canning, MA Triage Specialist [phone]

## 2017-03-23 NOTE — ED Notes (Signed)
BEHAVIORAL HEALTH ROUNDING Patient sleeping: No. Patient alert and oriented: yes Behavior appropriate: Yes.  ; If no, describe:  Nutrition and fluids offered: yes Toileting and hygiene offered: Yes  Sitter present: q15 minute observations and security monitoring Law enforcement present: Yes  ODS  She is currently talking on the telephone - tearful

## 2017-03-23 NOTE — ED Provider Notes (Signed)
[facility] Emergency Department Provider Note  Time seen: 3:24 PM  I have reviewed the triage vital signs and the nursing notes.   HISTORY  Chief Complaint Suicidal    HPI Katelyn Galloway is a 56 y.o. female with a past medical history of depression, gastric reflux, anxiety, presents to the emergency department with depression and suicidal ideation. According to the patient over the past 6 months her father committed suicide, her dog died, her husband got injured and can no longer work, she is not able to work either and there possibly going to lose their house.  Patient states she just wants to take all of her medication and diet. Patient was seen at [facility] overnight last night, she states they discharged her this morning that she still feels like she wants to kill her self. Patient denies any medical complaints at this time. Admits to occasional marijuana use. Patient smokes cigarettes. Denies alcohol use.  Past Medical History:  Diagnosis Date  . Anemia 1970  . Anxiety   . Arthritis   . Asthma    child  . Bronchitis    "years ago"  . Bronchitis    chronic hx  . Chronic back pain    hx of  . Depression   . GERD (gastroesophageal reflux disease)    occ  . Headache(784.0)    migraines  . Heart palpitations    occ if get anxious- no tests  . History of kidney stones   . Irritable bowel syndrome (IBS)   . Seizures (HCC)    x 2 during pregnancy per pt was not diagnosed with eclampsia    Patient Active Problem List   Diagnosis Date Noted  . Major depressive disorder, recurrent episode, mild (HCC) 03/23/2017  . Cannabis abuse 03/23/2017    Past Surgical History:  Procedure Laterality Date  . ABDOMINAL HYSTERECTOMY  1992  . BACK SURGERY     x2  . CHOLECYSTECTOMY  2009 or 2010  . HARDWARE REMOVAL  12/29/2012   Procedure: HARDWARE REMOVAL;  Surgeon: Tia Alert, MD;  Location: [facility] NEURO ORS;  Service: Neurosurgery;  Laterality:  N/A;  Lumbar hardware extraction  . kindey stone removal    . KNEE ARTHROSCOPY     right knee  . SPINAL CORD STIMULATOR INSERTION N/A 01/25/2015   Procedure: LUMBAR SPINAL CORD STIMULATOR INSERTION;  Surgeon: Gwynne Edinger, MD;  Location: [facility] NEURO ORS;  Service: Neurosurgery;  Laterality: N/A;  . TUBAL LIGATION      Prior to Admission medications   Medication Sig Start Date End Date Taking? Authorizing Provider  albuterol (PROVENTIL HFA;VENTOLIN HFA) 108 (90 Base) MCG/ACT inhaler INHALE TWO PUFFS BY MOUTH EVERY 6 HOURS AS NEEDED FOR WHEEZING OR FOR SHORTNESS OF BREATH 08/14/16   Historical Provider, MD  carbamazepine (TEGRETOL) 200 MG tablet Take 200 mg by mouth at bedtime.     Historical Provider, MD  carisoprodol (SOMA) 350 MG tablet Take 350 mg by mouth 2 (two) times daily. spasm    Historical Provider, MD  Cholecalciferol (VITAMIN D3) 5000 units CAPS Take by mouth.    Historical Provider, MD  Cranberry 1000 MG CAPS Take 2 capsules by mouth daily.    Historical Provider, MD  diazepam (VALIUM) 5 MG tablet Take 10 mg by mouth 2 (two) times daily.     Historical Provider, MD  DULoxetine (CYMBALTA) 30 MG capsule Take 30 mg by mouth 2 (two) times daily.  02/05/17   Historical Provider,  MD  FLUoxetine (PROZAC) 20 MG capsule Take 20 mg by mouth at bedtime.  02/05/17   Historical Provider, MD  gabapentin (NEURONTIN) 600 MG tablet Take 600 mg by mouth 3 (three) times daily.  12/22/16   Historical Provider, MD  hydroxypropyl methylcellulose (ISOPTO TEARS) 2.5 % ophthalmic solution Place 1 drop into both eyes at bedtime.    Historical Provider, MD  hydrOXYzine (ATARAX/VISTARIL) 25 MG tablet Take 25 mg by mouth daily as needed for anxiety.     Historical Provider, MD  levothyroxine (SYNTHROID, LEVOTHROID) 25 MCG tablet Take 25 mcg by mouth daily before breakfast.  02/25/17   Historical Provider, MD  meloxicam (MOBIC) 15 MG tablet Take 15 mg by mouth daily.  02/04/17   Historical Provider, MD  Menthol-Methyl  Salicylate (MUSCLE RUB) 10-15 % CREA Apply 1 application topically daily as needed. Joint pain    Historical Provider, MD  morphine (MS CONTIN) 15 MG 12 hr tablet Take 15 mg by mouth every 12 (twelve) hours.    Historical Provider, MD  Multiple Vitamin (MULTIVITAMIN) capsule Take 1 capsule by mouth daily.     Historical Provider, MD  oxyCODONE (ROXICODONE) 15 MG immediate release tablet Take 15 mg by mouth every 4 (four) hours as needed for pain (for breakthrough pain).    Historical Provider, MD  oxyCODONE (ROXICODONE) 5 MG immediate release tablet Take 1 tablet (5 mg total) by mouth every 4 (four) hours as needed for severe pain (if needed in addition to previous prescription). Patient not taking: Reported on 03/22/2017 01/25/15   Odette Fraction, MD  polyvinyl alcohol-povidone (HYPOTEARS) 1.4-0.6 % ophthalmic solution Place 1 drop into both eyes daily as needed (dry eyes).     Historical Provider, MD  Polyvinyl Alcohol-Povidone (REFRESH OP) Apply 1 drop to eye as needed (dry eyes).    Historical Provider, MD  prazosin (MINIPRESS) 1 MG capsule Take 2 mg by mouth at bedtime.  12/24/16 12/24/17  Historical Provider, MD  Probiotic Product (PHILLIPS COLON HEALTH PO) Take 1 capsule by mouth daily.    Historical Provider, MD  QUEtiapine (SEROQUEL) 50 MG tablet Take 50 mg by mouth at bedtime.  02/05/17   Historical Provider, MD  Wheat Dextrin (BENEFIBER PO) Take 1 packet by mouth daily. miralax    Historical Provider, MD    Allergies  Allergen Reactions  . Iodine Hives  . Tape Rash    Itching, paper tape    Family History  Problem Relation Age of Onset  . Anesthesia problems Neg Hx     Social History Social History  Substance Use Topics  . Smoking status: Current Some Day Smoker    Packs/day: 0.25    Years: 38.00    Types: Cigarettes  . Smokeless tobacco: Never Used     Comment: Pt reports using an electric cigarette ~ 2 times a day  . Alcohol use 0.6 oz/week    1 Glasses of wine per week     Review of Systems Constitutional: Negative for fever Cardiovascular: Negative for chest pain. Respiratory: Negative for shortness of breath. Gastrointestinal: Negative for abdominal pain Neurological: Negative for headache 10-point ROS otherwise negative.  ____________________________________________   PHYSICAL EXAM:  VITAL SIGNS: ED Triage Vitals  Enc Vitals Group     BP 03/23/17 1446 (!) 132/96     Pulse Rate 03/23/17 1446 88     Resp 03/23/17 1446 18     Temp 03/23/17 1446 98.7 F (37.1 C)     Temp Source 03/23/17 1446 Oral  SpO2 03/23/17 1446 99 %     Weight 03/23/17 1446 143 lb (64.9 kg)     Height 03/23/17 1446  (1.626 m)     Head Circumference --      Peak Flow --      Pain Score 03/23/17 1444 7     Pain Loc --      Pain Edu? --      Excl. in GC? --     Constitutional: Alert and oriented. Tearful throughout examination. Eyes: Normal exam ENT   Head: Normocephalic and atraumatic.   Mouth/Throat: Mucous membranes are moist. Cardiovascular: Normal rate, regular rhythm. No murmur Respiratory: Normal respiratory effort without tachypnea nor retractions. Breath sounds are clear  Gastrointestinal: Soft and nontender. No distention.   Musculoskeletal: Nontender with normal range of motion in all extremities.  Neurologic:  Normal speech and language. No gross focal neurologic deficits Skin:  Skin is warm, dry and intact.  Psychiatric: Tearful, admits to depression with active suicidal ideation with plan.  ____________________________________________   INITIAL IMPRESSION / ASSESSMENT AND PLAN / ED COURSE  Pertinent labs & imaging results that were available during my care of the patient were reviewed by me and considered in my medical decision making (see chart for details).  Patient presents the emergency department with suicidal ideation with plan to overdose on medications to kill herself. Patient was seen at [facility] yesterday  however states this morning when asked if she wanted to kill herself she said no, but immediately regretted this decision and says she does feel like killing herself. Patient is tearful, depressed. We will place the patient under an involuntary commitment and have psychiatry evaluate the patient.  ____________________________________________   FINAL CLINICAL IMPRESSION(S) / ED DIAGNOSES  Suicidal ideation    Minna Antis, MD 03/27/17 1352

## 2017-03-23 NOTE — ED Notes (Signed)
Patient's clothes sent home with patient's husband.

## 2017-03-23 NOTE — ED Notes (Signed)
Pt discharged ambulatory.  She was very irritable and stated a pair of panties were missing from her belongings.  Panties were not listed on belonging sheet.  Discharge instructions were reviewed.  All belongings that we had were returned to pt.
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
## 2017-03-23 NOTE — ED Notes (Signed)
Pt presents with SI, reports she is tired of living and prays every night that she will die.  Feeling hopeless.  Denies HI or AVH.  A&O x 3, no distress noted, pt cooperative but anxious and tearful.  Monitoring for safety, Q 15 min checks in effect.  Safety check for contraband completed, no items found.

## 2017-03-23 NOTE — ED Notes (Signed)
Pt moved to [facility], report given to Minerva Areola, Charity fundraiser . Personal belongings 2 bags sent with pt to BMU.

## 2017-03-23 NOTE — ED Triage Notes (Signed)
Patient presents to the ED with suicidal ideation.  Patient states, "I just want to take all my medicine and not wake up."  Patient is very tearful in triage.  Patient states she was evaluated earlier today at [facility] and they asked if she wanted to hurt herself or others and patient states, "I said no, but I was sleepy and I didn't really understand the question."  Patient reports that her father recently commited suicide and her dog recently died.  Patient states due to her sciatica she has not been able to work.  Patient states, "I'm just tired of sitting at home and not doing anything."

## 2017-03-23 NOTE — Consult Note (Signed)
[facility] Consult   Reason for Consult:  Consult for 56 year old woman with a history of major depression presents reporting suicidal ideation Referring Physician:  Paduchowski Patient Identification: Katelyn Galloway MRN:  [id number] Principal Diagnosis: Severe recurrent major depression without psychotic features [facility]) Diagnosis:   Patient Active Problem List   Diagnosis Date Noted  . Major depressive disorder, recurrent episode, mild ([place]) [F33.0] 03/23/2017  . Cannabis abuse [F12.10] 03/23/2017  . Severe recurrent major depression without psychotic features ([place]) [F33.2] 03/23/2017    Total Time spent with patient: 1 hour  Subjective:   Katelyn Galloway is a 56 y.o. female patient admitted with "I'm sick of feeling so anxious and depressed".  HPI:  Patient interviewed. Chart reviewed. Patient reports that she has been feeling very anxious and depressed for years but is been worse for the last 6 or 7 days. At night her thoughts race and she gets really panicky. Has had suicidal thoughts with thoughts of overdosing on her medicine. She says she is actually sleeping a lot more in the last few days and she does usually. Denies any hallucinations denies homicidal ideation. She blames a lot of her symptoms on her medications having been changed a month or so ago when she was seeing a new doctor. Admits that she uses marijuana regularly. Denies other drug abuse. Currently taking Prozac and Cymbalta which she thinks is not working as well as her prior Effexor.  Social history: Lives with her husband. Not working outside the home. Disabled.  Medical history: Chronic back pain and is on chronic narcotic treatment. History of high blood pressure history of COPD  Substance abuse history: Denies regular alcohol use or other drugs except for marijuana which she admits she smokes every night.  Past Psychiatric History: Patient has had no previous psychiatric hospitalizations no  previous visits to our facility. She did try to kill her self in 2010 which was her only suicide attempt. No history of mania.  Risk to Self: Is patient at risk for suicide?: Yes Risk to Others:   Prior Inpatient Therapy:   Prior Outpatient Therapy:    Past Medical History:  Past Medical History:  Diagnosis Date  . Anemia 1970  . Anxiety   . Arthritis   . Asthma    child  . Bronchitis    "years ago"  . Bronchitis    chronic hx  . Chronic back pain    hx of  . Depression   . GERD (gastroesophageal reflux disease)    occ  . Headache(784.0)    migraines  . Heart palpitations    occ if get anxious- no tests  . History of kidney stones   . Irritable bowel syndrome (IBS)   . Seizures ([place])    x 2 during pregnancy per pt was not diagnosed with eclampsia    Past Surgical History:  Procedure Laterality Date  . ABDOMINAL HYSTERECTOMY  1992  . BACK SURGERY     x2  . CHOLECYSTECTOMY  2009 or 2010  . HARDWARE REMOVAL  12/29/2012   Procedure: HARDWARE REMOVAL;  Surgeon: Eustace Moore, MD;  Location: [facility];  Service: Neurosurgery;  Laterality: N/A;  Lumbar hardware extraction  . kindey stone removal    . KNEE ARTHROSCOPY     right knee  . SPINAL CORD STIMULATOR INSERTION N/A 01/25/2015   Procedure: LUMBAR SPINAL CORD STIMULATOR INSERTION;  Surgeon: Bonna Gains, MD;  Location: [facility];  Service: Neurosurgery;  Laterality:  N/A;  . TUBAL LIGATION     Family History:  Family History  Problem Relation Age of Onset  . Anesthesia problems Neg Hx    Family Psychiatric  History: Her father who was elderly committed suicide this last fall. Social History:  History  Alcohol Use  . 0.6 oz/week  . 1 Glasses of wine per week     History  Drug Use No    Social History   Social History  . Marital status: Divorced    Spouse name: N/A  . Number of children: N/A  . Years of education: N/A   Social History Main Topics  . Smoking status: Current Some Day Smoker     Packs/day: 0.25    Years: 38.00    Types: Cigarettes  . Smokeless tobacco: Never Used     Comment: Pt reports using an electric cigarette ~ 2 times a day  . Alcohol use 0.6 oz/week    1 Glasses of wine per week  . Drug use: No  . Sexual activity: Not Asked   Other Topics Concern  . None   Social History Narrative  . None   Additional Social History:    Allergies:   Allergies  Allergen Reactions  . Iodine Hives  . Tape Rash    Itching, paper tape    Labs:  Results for orders placed or performed during the hospital encounter of 03/23/17 (from the past 48 hour(s))  Urine Drug Screen, Qualitative     Status: Abnormal   Collection Time: 03/23/17  2:30 PM  Result Value Ref Range   Tricyclic, Ur Screen NONE DETECTED NONE DETECTED   Amphetamines, Ur Screen NONE DETECTED NONE DETECTED   MDMA (Ecstasy)Ur Screen NONE DETECTED NONE DETECTED   Cocaine Metabolite,Ur [place] NONE DETECTED NONE DETECTED   Opiate, Ur Screen POSITIVE (A) NONE DETECTED   Phencyclidine (PCP) Ur S NONE DETECTED NONE DETECTED   Cannabinoid 50 Ng, Ur [place] POSITIVE (A) NONE DETECTED   Barbiturates, Ur Screen NONE DETECTED NONE DETECTED   Benzodiazepine, Ur Scrn POSITIVE (A) NONE DETECTED   Methadone Scn, Ur NONE DETECTED NONE DETECTED    Comment: (NOTE) 100  Tricyclics, urine               Cutoff 1000 ng/mL 200  Amphetamines, urine             Cutoff 1000 ng/mL 300  MDMA (Ecstasy), urine           Cutoff 500 ng/mL 400  Cocaine Metabolite, urine       Cutoff 300 ng/mL 500  Opiate, urine                   Cutoff 300 ng/mL 600  Phencyclidine (PCP), urine      Cutoff 25 ng/mL 700  Cannabinoid, urine              Cutoff 50 ng/mL 800  Barbiturates, urine             Cutoff 200 ng/mL 900  Benzodiazepine, urine           Cutoff 200 ng/mL 1000 Methadone, urine                Cutoff 300 ng/mL 1100 1200 The urine drug screen provides only a preliminary, unconfirmed 1300 analytical test result and should not be used for  non-medical 1400 purposes. Clinical consideration and professional judgment should 1500 be applied to any positive drug screen result due to possible  1600 interfering substances. A more specific alternate chemical method 1700 must be used in order to obtain a confirmed analytical result.  1800 Gas chromato graphy / mass spectrometry (GC/MS) is the preferred 1900 confirmatory method.   Comprehensive metabolic panel     Status: Abnormal   Collection Time: 03/23/17  2:47 PM  Result Value Ref Range   Sodium 134 (L) 135 - 145 mmol/L   Potassium 3.7 3.5 - 5.1 mmol/L   Chloride 100 (L) 101 - 111 mmol/L   CO2 27 22 - 32 mmol/L   Glucose, Bld 100 (H) 65 - 99 mg/dL   BUN 13 6 - 20 mg/dL   Creatinine, Ser 0.83 0.44 - 1.00 mg/dL   Calcium 9.0 8.9 - 10.3 mg/dL   Total Protein 7.7 6.5 - 8.1 g/dL   Albumin 4.2 3.5 - 5.0 g/dL   AST 23 15 - 41 U/L   ALT 17 14 - 54 U/L   Alkaline Phosphatase 84 38 - 126 U/L   Total Bilirubin 0.4 0.3 - 1.2 mg/dL   GFR calc non Af Amer >60 >60 mL/min   GFR calc Af Amer >60 >60 mL/min    Comment: (NOTE) The eGFR has been calculated using the CKD EPI equation. This calculation has not been validated in all clinical situations. eGFR's persistently <60 mL/min signify possible Chronic Kidney Disease.    Anion gap 7 5 - 15  Ethanol     Status: None   Collection Time: 03/23/17  2:47 PM  Result Value Ref Range   Alcohol, Ethyl (B) <5 <5 mg/dL    Comment:        LOWEST DETECTABLE LIMIT FOR SERUM ALCOHOL IS 5 mg/dL FOR MEDICAL PURPOSES ONLY   Salicylate level     Status: None   Collection Time: 03/23/17  2:47 PM  Result Value Ref Range   Salicylate Lvl <1.6 2.8 - 30.0 mg/dL  Acetaminophen level     Status: Abnormal   Collection Time: 03/23/17  2:47 PM  Result Value Ref Range   Acetaminophen (Tylenol), Serum <10 (L) 10 - 30 ug/mL    Comment:        THERAPEUTIC CONCENTRATIONS VARY SIGNIFICANTLY. A RANGE OF 10-30 ug/mL MAY BE AN EFFECTIVE CONCENTRATION FOR  MANY PATIENTS. HOWEVER, SOME ARE BEST TREATED AT CONCENTRATIONS OUTSIDE THIS RANGE. ACETAMINOPHEN CONCENTRATIONS >150 ug/mL AT 4 HOURS AFTER INGESTION AND >50 ug/mL AT 12 HOURS AFTER INGESTION ARE OFTEN ASSOCIATED WITH TOXIC REACTIONS.   cbc     Status: None   Collection Time: 03/23/17  2:47 PM  Result Value Ref Range   WBC 7.0 3.6 - 11.0 K/uL   RBC 4.48 3.80 - 5.20 MIL/uL   Hemoglobin 14.0 12.0 - 16.0 g/dL   HCT 41.1 35.0 - 47.0 %   MCV 91.7 80.0 - 100.0 fL   MCH 31.3 26.0 - 34.0 pg   MCHC 34.1 32.0 - 36.0 g/dL   RDW 13.6 11.5 - 14.5 %   Platelets 290 150 - 440 K/uL    Current Facility-Administered Medications  Medication Dose Route Frequency Provider Last Rate Last Dose  . albuterol (PROVENTIL HFA;VENTOLIN HFA) 108 (90 Base) MCG/ACT inhaler 2 puff  2 puff Inhalation Q4H PRN Gonzella Lex, MD      . carbamazepine (TEGRETOL) tablet 200 mg  200 mg Oral QHS Olisa Quesnel T Ruthetta Koopmann, MD      . diazepam (VALIUM) tablet 5 mg  5 mg Oral TID Gonzella Lex, MD      .  DULoxetine (CYMBALTA) DR capsule 30 mg  30 mg Oral BID Gonzella Lex, MD      . FLUoxetine (PROZAC) capsule 20 mg  20 mg Oral QHS Gonzella Lex, MD      . gabapentin (NEURONTIN) capsule 600 mg  600 mg Oral TID Gonzella Lex, MD      . Derrill Memo ON 03/24/2017] levothyroxine (SYNTHROID, LEVOTHROID) tablet 25 mcg  25 mcg Oral QAC breakfast Gonzella Lex, MD      . meloxicam (MOBIC) tablet 15 mg  15 mg Oral Daily Gonzella Lex, MD      . morphine (MS CONTIN) 12 hr tablet 15 mg  15 mg Oral Q12H Kindel Rochefort T Giovan Pinsky, MD      . nicotine (NICODERM CQ - dosed in mg/24 hours) patch 21 mg  21 mg Transdermal Once Harvest Dark, MD   21 mg at 03/23/17 1544  . prazosin (MINIPRESS) capsule 2 mg  2 mg Oral QHS Gonzella Lex, MD      . Derrill Memo ON 03/24/2017] venlafaxine XR (EFFEXOR-XR) 24 hr capsule 150 mg  150 mg Oral Q breakfast Gonzella Lex, MD       Current Outpatient Prescriptions  Medication Sig Dispense Refill  . albuterol (PROVENTIL  HFA;VENTOLIN HFA) 108 (90 Base) MCG/ACT inhaler INHALE TWO PUFFS BY MOUTH EVERY 6 HOURS AS NEEDED FOR WHEEZING OR FOR SHORTNESS OF BREATH    . carbamazepine (TEGRETOL) 200 MG tablet Take 200 mg by mouth at bedtime.     . carisoprodol (SOMA) 350 MG tablet Take 350 mg by mouth 2 (two) times daily. spasm    . Cholecalciferol (VITAMIN D3) 5000 units CAPS Take by mouth.    . Cranberry 1000 MG CAPS Take 2 capsules by mouth daily.    . diazepam (VALIUM) 5 MG tablet Take 10 mg by mouth 2 (two) times daily.     . DULoxetine (CYMBALTA) 30 MG capsule Take 30 mg by mouth 2 (two) times daily.     Marland Kitchen FLUoxetine (PROZAC) 20 MG capsule Take 20 mg by mouth at bedtime.     . gabapentin (NEURONTIN) 600 MG tablet Take 600 mg by mouth 3 (three) times daily.     . hydroxypropyl methylcellulose (ISOPTO TEARS) 2.5 % ophthalmic solution Place 1 drop into both eyes at bedtime.    . hydrOXYzine (ATARAX/VISTARIL) 25 MG tablet Take 25 mg by mouth daily as needed for anxiety.     Marland Kitchen levothyroxine (SYNTHROID, LEVOTHROID) 25 MCG tablet Take 25 mcg by mouth daily before breakfast.     . meloxicam (MOBIC) 15 MG tablet Take 15 mg by mouth daily.     . Menthol-Methyl Salicylate (MUSCLE RUB) 10-15 % CREA Apply 1 application topically daily as needed. Joint pain    . morphine (MS CONTIN) 15 MG 12 hr tablet Take 15 mg by mouth every 12 (twelve) hours.    . Multiple Vitamin (MULTIVITAMIN) capsule Take 1 capsule by mouth daily.     Marland Kitchen oxyCODONE (ROXICODONE) 15 MG immediate release tablet Take 15 mg by mouth every 4 (four) hours as needed for pain (for breakthrough pain).    Marland Kitchen oxyCODONE (ROXICODONE) 5 MG immediate release tablet Take 1 tablet (5 mg total) by mouth every 4 (four) hours as needed for severe pain (if needed in addition to previous prescription). (Patient not taking: Reported on 03/22/2017) 45 tablet 0  . polyvinyl alcohol-povidone (HYPOTEARS) 1.4-0.6 % ophthalmic solution Place 1 drop into both eyes daily as needed (dry eyes).      Marland Kitchen  Polyvinyl Alcohol-Povidone (REFRESH OP) Apply 1 drop to eye as needed (dry eyes).    . prazosin (MINIPRESS) 1 MG capsule Take 2 mg by mouth at bedtime.     . Probiotic Product (PHILLIPS COLON HEALTH PO) Take 1 capsule by mouth daily.    . QUEtiapine (SEROQUEL) 50 MG tablet Take 50 mg by mouth at bedtime.     . Wheat Dextrin (BENEFIBER PO) Take 1 packet by mouth daily. miralax      Musculoskeletal: Strength & Muscle Tone: within normal limits Gait & Station: normal Patient leans: N/A  Psychiatric Specialty Exam: Physical Exam  Nursing note and vitals reviewed. Constitutional: She appears well-developed and well-nourished.  HENT:  Head: Normocephalic and atraumatic.  Eyes: Conjunctivae are normal. Pupils are equal, round, and reactive to light.  Neck: Normal range of motion.  Cardiovascular: Normal rate and normal heart sounds.   Respiratory: Effort normal. No respiratory distress.  GI: Soft.  Musculoskeletal: Normal range of motion.  Neurological: She is alert.  Skin: Skin is warm and dry.  Psychiatric: Her mood appears anxious. Her speech is delayed and tangential. She is slowed. Thought content is paranoid. She expresses impulsivity. She exhibits a depressed mood. She expresses suicidal ideation. She exhibits abnormal recent memory.    Review of Systems  Constitutional: Negative.   HENT: Negative.   Eyes: Negative.   Respiratory: Negative.   Cardiovascular: Negative.   Gastrointestinal: Negative.   Musculoskeletal: Negative.   Skin: Negative.   Neurological: Negative.   Psychiatric/Behavioral: Positive for depression, substance abuse and suicidal ideas. Negative for hallucinations and memory loss. The patient is not nervous/anxious and does not have insomnia.     Blood pressure (!) 132/96, pulse 88, temperature 98.7 F (37.1 C), temperature source Oral, resp. rate 18, height _0  (1.626 m), weight 64.9 kg (143 lb), SpO2 99 %.Body mass index is 24.55 kg/m.  General  Appearance: Casual  Eye Contact:  Fair  Speech:  Slow  Volume:  Decreased  Mood:  Depressed  Affect:  Depressed and Tearful  Thought Process:  Goal Directed  Orientation:  Full (Time, Place, and Person)  Thought Content:  Logical  Suicidal Thoughts:  Yes.  with intent/plan  Homicidal Thoughts:  No  Memory:  Immediate;   Fair Recent;   Fair Remote;   Fair  Judgement:  Impaired  Insight:  Fair  Psychomotor Activity:  Decreased  Concentration:  Concentration: Fair  Recall:  [employer] of Knowledge:  Fair  Language:  Fair  Akathisia:  No  Handed:  Right  AIMS (if indicated):     Assets:  Communication Skills Desire for Improvement Housing  ADL's:  Intact  Cognition:  WNL  Sleep:        Treatment Plan Summary: Daily contact with patient to assess and evaluate symptoms and progress in treatment, Medication management and Plan Patient will be admitted to the psychiatry unit because of active suicidal ideation. Switch her antidepressant back to Effexor. Continue current medicine including the narcotics and Valium that I've confirm she is regularly prescribed. Psychoeducation provided. Full set of labs to be obtained.  Disposition: Recommend psychiatric Inpatient admission when medically cleared. Supportive therapy provided about ongoing stressors.  Alethia Berthold, MD 03/23/2017 5:59 PM

## 2017-03-23 NOTE — Discharge Instructions (Signed)
For your ongoing behavioral health needs, you are advised to follow up with Milagros Evener, MD.  Bonita Quin have an appointment scheduled for Monday, Apr 26, 2017.  Plan to be there at 1:45 pm:       Milagros Evener, MD      [address]      [place], [place] [address]      [phone]

## 2017-03-23 NOTE — Consult Note (Signed)
[facility] Consult   Reason for Consult:  Depression with suicidal ideations Referring Physician:  EDP Patient Identification: Katelyn Galloway MRN:  [id number] Principal Diagnosis: Major depressive disorder, recurrent episode, mild ([place]) Diagnosis:   Patient Active Problem List   Diagnosis Date Noted  . Major depressive disorder, recurrent episode, mild ([place]) [F33.0] 03/23/2017    Priority: High  . Cannabis abuse [F12.10] 03/23/2017    Priority: High    Total Time spent with patient: 45 minutes  Subjective:   Katelyn Galloway is a 56 y.o. female patient does not warrant admission.  HPI:  57 yo female who presented to the ED with depression and suicidal ideations, no plan or intent.  Today, she denies suicidal/homicidal ideations, hallucinations, or withdrawal symptoms.  She wants her medications changed, appointment with Dr. Toy Care on 5/7--referred her to her psychiatrist as she left her other place due to not liking her psychiatrist. Group therapy in [place] twice a week. Lives with her boyfriend of 20 years.  Reports chronic back pain with morphine and Valium use but sitting on the bed with her legs crisscrossed with no signs of pain.  When she was told she was discharging, she called this provider rude and told the nurse she would be going to another hospital to get what she wanted.  Med seeking behaviors present for narcotics and opiates.  Past Psychiatric History: depression, substance abuse  Risk to Self: None Risk to Others: Homicidal Ideation: No Thoughts of Harm to Others: No Current Homicidal Intent: No Current Homicidal Plan: No Access to Homicidal Means: No Identified Victim: none History of harm to others?: No Assessment of Violence: None Noted Violent Behavior Description: n/a Does patient have access to weapons?: Yes (Comment) Criminal Charges Pending?: No Does patient have a court date: No Prior Inpatient Therapy: Prior Inpatient Therapy:  Yes Prior Therapy Dates: 2010 Prior Therapy Facilty/Provider(s): [facility] Reason for Treatment: Bipolar, PSTD, SI Prior Outpatient Therapy: Prior Outpatient Therapy: Yes Prior Therapy Dates: 2018 Prior Therapy Facilty/Provider(s): psychiatrist former unspecified Reason for Treatment: Bipolar, PTSD Does patient have an ACCT team?: No Does patient have Intensive In-House Services?  : No Does patient have [facility] services? : No Does patient have P4CC services?: No  Past Medical History:  Past Medical History:  Diagnosis Date  . Anemia 1970  . Anxiety   . Arthritis   . Asthma    child  . Bronchitis    "years ago"  . Bronchitis    chronic hx  . Chronic back pain    hx of  . Depression   . GERD (gastroesophageal reflux disease)    occ  . Headache(784.0)    migraines  . Heart palpitations    occ if get anxious- no tests  . History of kidney stones   . Irritable bowel syndrome (IBS)   . Seizures ([place])    x 2 during pregnancy per pt was not diagnosed with eclampsia    Past Surgical History:  Procedure Laterality Date  . ABDOMINAL HYSTERECTOMY  1992  . BACK SURGERY     x2  . CHOLECYSTECTOMY  2009 or 2010  . HARDWARE REMOVAL  12/29/2012   Procedure: HARDWARE REMOVAL;  Surgeon: Eustace Moore, MD;  Location: [facility];  Service: Neurosurgery;  Laterality: N/A;  Lumbar hardware extraction  . kindey stone removal    . KNEE ARTHROSCOPY     right knee  . SPINAL CORD STIMULATOR INSERTION N/A 01/25/2015   Procedure: LUMBAR SPINAL CORD STIMULATOR  INSERTION;  Surgeon: Bonna Gains, MD;  Location: [facility];  Service: Neurosurgery;  Laterality: N/A;  . TUBAL LIGATION     Family History:  Family History  Problem Relation Age of Onset  . Anesthesia problems Neg Hx    Family Psychiatric  History: none Social History:  History  Alcohol Use  . 0.6 oz/week  . 1 Glasses of wine per week     History  Drug Use No    Social History   Social History  . Marital status:  Divorced    Spouse name: N/A  . Number of children: N/A  . Years of education: N/A   Social History Main Topics  . Smoking status: Current Some Day Smoker    Packs/day: 0.25    Years: 38.00    Types: Cigarettes  . Smokeless tobacco: Never Used     Comment: Pt reports using an electric cigarette ~ 2 times a day  . Alcohol use 0.6 oz/week    1 Glasses of wine per week  . Drug use: No  . Sexual activity: Not Asked   Other Topics Concern  . None   Social History Narrative  . None   Additional Social History:    Allergies:   Allergies  Allergen Reactions  . Iodine Hives  . Tape Rash    Itching, paper tape    Labs:  Results for orders placed or performed during the hospital encounter of 03/22/17 (from the past 48 hour(s))  Comprehensive metabolic panel     Status: Abnormal   Collection Time: 03/22/17  7:54 PM  Result Value Ref Range   Sodium 140 135 - 145 mmol/L   Potassium 3.9 3.5 - 5.1 mmol/L   Chloride 108 101 - 111 mmol/L   CO2 24 22 - 32 mmol/L   Glucose, Bld 97 65 - 99 mg/dL   BUN 8 6 - 20 mg/dL   Creatinine, Ser 0.70 0.44 - 1.00 mg/dL   Calcium 9.1 8.9 - 10.3 mg/dL   Total Protein 7.3 6.5 - 8.1 g/dL   Albumin 3.8 3.5 - 5.0 g/dL   AST 19 15 - 41 U/L   ALT 18 14 - 54 U/L   Alkaline Phosphatase 83 38 - 126 U/L   Total Bilirubin 0.2 (L) 0.3 - 1.2 mg/dL   GFR calc non Af Amer >60 >60 mL/min   GFR calc Af Amer >60 >60 mL/min    Comment: (NOTE) The eGFR has been calculated using the CKD EPI equation. This calculation has not been validated in all clinical situations. eGFR's persistently <60 mL/min signify possible Chronic Kidney Disease.    Anion gap 8 5 - 15  Ethanol     Status: None   Collection Time: 03/22/17  7:54 PM  Result Value Ref Range   Alcohol, Ethyl (B) <5 <5 mg/dL    Comment:        LOWEST DETECTABLE LIMIT FOR SERUM ALCOHOL IS 5 mg/dL FOR MEDICAL PURPOSES ONLY   Salicylate level     Status: None   Collection Time: 03/22/17  7:54 PM   Result Value Ref Range   Salicylate Lvl <1.6 2.8 - 30.0 mg/dL  Acetaminophen level     Status: Abnormal   Collection Time: 03/22/17  7:54 PM  Result Value Ref Range   Acetaminophen (Tylenol), Serum <10 (L) 10 - 30 ug/mL    Comment:        THERAPEUTIC CONCENTRATIONS VARY SIGNIFICANTLY. A RANGE OF 10-30 ug/mL MAY BE  AN EFFECTIVE CONCENTRATION FOR MANY PATIENTS. HOWEVER, SOME ARE BEST TREATED AT CONCENTRATIONS OUTSIDE THIS RANGE. ACETAMINOPHEN CONCENTRATIONS >150 ug/mL AT 4 HOURS AFTER INGESTION AND >50 ug/mL AT 12 HOURS AFTER INGESTION ARE OFTEN ASSOCIATED WITH TOXIC REACTIONS.   cbc     Status: None   Collection Time: 03/22/17  7:54 PM  Result Value Ref Range   WBC 7.6 4.0 - 10.5 K/uL   RBC 4.49 3.87 - 5.11 MIL/uL   Hemoglobin 14.0 12.0 - 15.0 g/dL   HCT 41.2 36.0 - 46.0 %   MCV 91.8 78.0 - 100.0 fL   MCH 31.2 26.0 - 34.0 pg   MCHC 34.0 30.0 - 36.0 g/dL   RDW 13.1 11.5 - 15.5 %   Platelets 317 150 - 400 K/uL  Carbamazepine level, total     Status: None   Collection Time: 03/22/17  7:54 PM  Result Value Ref Range   Carbamazepine Lvl 5.2 4.0 - 12.0 ug/mL    Comment: Performed at [facility], [place] [address] [place], [place] [address]  Rapid urine drug screen (hospital performed)     Status: Abnormal   Collection Time: 03/22/17 11:53 PM  Result Value Ref Range   Opiates POSITIVE (A) NONE DETECTED   Cocaine NONE DETECTED NONE DETECTED   Benzodiazepines POSITIVE (A) NONE DETECTED   Amphetamines NONE DETECTED NONE DETECTED   Tetrahydrocannabinol POSITIVE (A) NONE DETECTED   Barbiturates NONE DETECTED NONE DETECTED    Comment:        DRUG SCREEN FOR MEDICAL PURPOSES ONLY.  IF CONFIRMATION IS NEEDED FOR ANY PURPOSE, NOTIFY LAB WITHIN 5 DAYS.        LOWEST DETECTABLE LIMITS FOR URINE DRUG SCREEN Drug Class       Cutoff (ng/mL) Amphetamine      1000 Barbiturate      200 Benzodiazepine   324 Tricyclics       401 Opiates          300 Cocaine          300 THC               50     Current Facility-Administered Medications  Medication Dose Route Frequency Provider Last Rate Last Dose  . acetaminophen (TYLENOL) tablet 650 mg  650 mg Oral Q4H PRN Lacretia Leigh, MD      . albuterol (PROVENTIL HFA;VENTOLIN HFA) 108 (90 Base) MCG/ACT inhaler 1-2 puff  1-2 puff Inhalation Q4H PRN Lacretia Leigh, MD      . alum & mag hydroxide-simeth (MAALOX/MYLANTA) 200-200-20 MG/5ML suspension 30 mL  30 mL Oral PRN Lacretia Leigh, MD      . carbamazepine (TEGRETOL) tablet 200 mg  200 mg Oral QHS Lacretia Leigh, MD   200 mg at 03/22/17 2203  . DULoxetine (CYMBALTA) DR capsule 30 mg  30 mg Oral BID Lacretia Leigh, MD   30 mg at 03/23/17 0946  . FLUoxetine (PROZAC) capsule 20 mg  20 mg Oral QHS Lacretia Leigh, MD   20 mg at 03/22/17 2203  . gabapentin (NEURONTIN) capsule 600 mg  600 mg Oral TID Lacretia Leigh, MD   600 mg at 03/23/17 0946  . hydrOXYzine (ATARAX/VISTARIL) tablet 25 mg  25 mg Oral Daily PRN Lacretia Leigh, MD   25 mg at 03/23/17 0152  . ibuprofen (ADVIL,MOTRIN) tablet 600 mg  600 mg Oral Q8H PRN Lacretia Leigh, MD      . levothyroxine (SYNTHROID, LEVOTHROID) tablet 25 mcg  25 mcg Oral QAC breakfast  Lacretia Leigh, MD   25 mcg at 03/23/17 0803  . meloxicam (MOBIC) tablet 15 mg  15 mg Oral Daily Lacretia Leigh, MD   15 mg at 03/23/17 0946  . MUSCLE RUB CREA 1 application  1 application Topical Daily PRN Lacretia Leigh, MD      . nicotine (NICODERM CQ - dosed in mg/24 hours) patch 21 mg  21 mg Transdermal Once Lacretia Leigh, MD   21 mg at 03/23/17 0152  . ondansetron (ZOFRAN) tablet 4 mg  4 mg Oral Q8H PRN Lacretia Leigh, MD      . polyvinyl alcohol (LIQUIFILM TEARS) 1.4 % ophthalmic solution 1 drop  1 drop Both Eyes QHS Lacretia Leigh, MD   1 drop at 03/22/17 2205  . prazosin (MINIPRESS) capsule 2 mg  2 mg Oral QHS Lacretia Leigh, MD   2 mg at 03/22/17 2202  . QUEtiapine (SEROQUEL) tablet 50 mg  50 mg Oral QHS Lacretia Leigh, MD   50 mg at 03/22/17 2203   Current Outpatient  Prescriptions  Medication Sig Dispense Refill  . albuterol (PROVENTIL HFA;VENTOLIN HFA) 108 (90 Base) MCG/ACT inhaler INHALE TWO PUFFS BY MOUTH EVERY 6 HOURS AS NEEDED FOR WHEEZING OR FOR SHORTNESS OF BREATH    . carbamazepine (TEGRETOL) 200 MG tablet Take 200 mg by mouth at bedtime.     . carisoprodol (SOMA) 350 MG tablet Take 350 mg by mouth 2 (two) times daily. spasm    . Cholecalciferol (VITAMIN D3) 5000 units CAPS Take by mouth.    . Cranberry 1000 MG CAPS Take 2 capsules by mouth daily.    . diazepam (VALIUM) 5 MG tablet Take 10 mg by mouth 2 (two) times daily.     . DULoxetine (CYMBALTA) 30 MG capsule Take 30 mg by mouth 2 (two) times daily.     Marland Kitchen FLUoxetine (PROZAC) 20 MG capsule Take 20 mg by mouth at bedtime.     . gabapentin (NEURONTIN) 600 MG tablet Take 600 mg by mouth 3 (three) times daily.     . hydroxypropyl methylcellulose (ISOPTO TEARS) 2.5 % ophthalmic solution Place 1 drop into both eyes at bedtime.    . hydrOXYzine (ATARAX/VISTARIL) 25 MG tablet Take 25 mg by mouth daily as needed for anxiety.     Marland Kitchen levothyroxine (SYNTHROID, LEVOTHROID) 25 MCG tablet Take 25 mcg by mouth daily before breakfast.     . meloxicam (MOBIC) 15 MG tablet Take 15 mg by mouth daily.     Marland Kitchen morphine (MS CONTIN) 15 MG 12 hr tablet Take 15 mg by mouth every 12 (twelve) hours.    . Multiple Vitamin (MULTIVITAMIN) capsule Take 1 capsule by mouth daily.     Marland Kitchen oxyCODONE (ROXICODONE) 15 MG immediate release tablet Take 15 mg by mouth every 4 (four) hours as needed for pain (for breakthrough pain).    . polyvinyl alcohol-povidone (HYPOTEARS) 1.4-0.6 % ophthalmic solution Place 1 drop into both eyes daily as needed (dry eyes).     . Polyvinyl Alcohol-Povidone (REFRESH OP) Apply 1 drop to eye as needed (dry eyes).    . prazosin (MINIPRESS) 1 MG capsule Take 2 mg by mouth at bedtime.     . Probiotic Product (PHILLIPS COLON HEALTH PO) Take 1 capsule by mouth daily.    . QUEtiapine (SEROQUEL) 50 MG tablet Take 50  mg by mouth at bedtime.     . Wheat Dextrin (BENEFIBER PO) Take 1 packet by mouth daily. miralax    . Menthol-Methyl Salicylate (MUSCLE RUB)  10-15 % CREA Apply 1 application topically daily as needed. Joint pain    . oxyCODONE (ROXICODONE) 5 MG immediate release tablet Take 1 tablet (5 mg total) by mouth every 4 (four) hours as needed for severe pain (if needed in addition to previous prescription). (Patient not taking: Reported on 03/22/2017) 45 tablet 0    Musculoskeletal: Strength & Muscle Tone: within normal limits Gait & Station: normal Patient leans: N/A  Psychiatric Specialty Exam: Physical Exam  Constitutional: She is oriented to person, place, and time. She appears well-developed and well-nourished.  HENT:  Head: Normocephalic.  Neck: Normal range of motion.  Respiratory: Effort normal.  Musculoskeletal: Normal range of motion.  Neurological: She is alert and oriented to person, place, and time.  Psychiatric: Her speech is normal and behavior is normal. Judgment and thought content normal. Cognition and memory are normal. She exhibits a depressed mood.    Review of Systems  Psychiatric/Behavioral: Positive for depression and substance abuse.  All other systems reviewed and are negative.   Blood pressure 106/65, pulse 68, temperature 97.9 F (36.6 C), temperature source Oral, resp. rate 16, SpO2 100 %.There is no height or weight on file to calculate BMI.  General Appearance: Casual  Eye Contact:  Good  Speech:  Normal Rate  Volume:  Normal  Mood:  Depressed, mild  Affect:  Congruent  Thought Process:  Coherent and Descriptions of Associations: Intact  Orientation:  Full (Time, Place, and Person)  Thought Content:  WDL and Logical  Suicidal Thoughts:  No  Homicidal Thoughts:  No  Memory:  Immediate;   Good Recent;   Good Remote;   Good  Judgement:  Fair  Insight:  Fair  Psychomotor Activity:  Normal  Concentration:  Concentration: Good and Attention Span: Good   Recall:  Good  Fund of Knowledge:  Fair  Language:  Good  Akathisia:  No  Handed:  Right  AIMS (if indicated):     Assets:  Housing Leisure Time Physical Health Resilience Social Support  ADL's:  Intact  Cognition:  WNL  Sleep:        Treatment Plan Summary: Daily contact with patient to assess and evaluate symptoms and progress in treatment, Medication management and Plan major depressive disorder, recurrent, mild:  -Crisis stabilization -Medication management:  Continued medical medications except narcotics along with Cymbalta 30 mg BID, Tegretol 200 mg BID for mood stabilization, Prozac 20 mg daily for depression, Seroquel 50 mg at bedtime for sleep and mood, Prazosin 2 mg at bedtime for nightmares, Vistaril 25 mg daily PRN anxiety -Individual counseling  Disposition: No evidence of imminent risk to self or others at present.    Waylan Boga, NP 03/23/2017 9:54 AM  Patient seen face-to-face for psychiatric evaluation, chart reviewed and case discussed with the physician extender and developed treatment plan. Reviewed the information documented and agree with the treatment plan. Corena Pilgrim, MD

## 2017-03-23 NOTE — ED Notes (Signed)
This patient is very agitated as she feels she is not getting the help she needs.  She denied suicidal ideations but want to be admitted for medication management.

## 2017-03-23 NOTE — ED Notes (Signed)
BEHAVIORAL HEALTH ROUNDING Patient sleeping: No. Patient alert and oriented: yes Behavior appropriate: Yes.  ; If no, describe:  Nutrition and fluids offered: yes Toileting and hygiene offered: Yes  Sitter present: q15 minute observations and security  monitoring Law enforcement present: Yes  ODS  

## 2017-03-23 NOTE — ED Notes (Signed)
meds administered as ordered  Pt verbalizing  "I want my morphine IR for breakthrough pain - now cause I can have one for breakthrough pain"  Pt informed that here it is ordered q12 hours  She verbalizes understanding

## 2017-03-23 NOTE — ED Notes (Signed)
PT IVC PENDING PSYCH CONSULT. 

## 2017-03-23 NOTE — ED Notes (Addendum)
Pt stated "I'm so bored here.  Can I just go out to smoke?  I'll come right back."  Informed not able to go outside but can request a nicotine patch.  Pt stated "I was going to [facility] and my doctor left from there.  They told me I could just find someone on my own.  The doctor there was so good.  They got my medicine fixed but then the doctor that I saw changed up all my meds because I asked to increase my valium but he changed that to ativan.  My husband, I love him, but he's narcissistic.  I have kids but they don't have anything to do with me.  The rest of my family doesn't have anything to do with me.  I'm the crazy one.  I have a son just like me that lives in [place].  I haven't talked to him in 6 months.  I got a settlement and I built a house but I was dumb and it's in both our names, his truck is mine but it's in both names.  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  I have an internal TENS.  I will be going to see Dr. Evelene Croon.  I know several people that sees her and they love her."

## 2017-03-24 ENCOUNTER — Encounter: Payer: Self-pay | Admitting: Behavioral Health

## 2017-03-24 DIAGNOSIS — F17200 Nicotine dependence, unspecified, uncomplicated: Secondary | ICD-10-CM

## 2017-03-24 DIAGNOSIS — J45909 Unspecified asthma, uncomplicated: Secondary | ICD-10-CM

## 2017-03-24 DIAGNOSIS — F3130 Bipolar disorder, current episode depressed, mild or moderate severity, unspecified: Secondary | ICD-10-CM | POA: Diagnosis present

## 2017-03-24 DIAGNOSIS — F3132 Bipolar disorder, current episode depressed, moderate: Secondary | ICD-10-CM

## 2017-03-24 LAB — LIPID PANEL
CHOL/HDL RATIO: 3.3 ratio
CHOLESTEROL: 173 mg/dL (ref 0–200)
HDL: 53 mg/dL (ref >40)
LDL Cholesterol: 92 mg/dL (ref 0–99)
Triglycerides: 139 mg/dL (ref <150)
VLDL: 28 mg/dL (ref 0–40)

## 2017-03-24 LAB — TSH: TSH: 6.234 u[IU]/mL — ABNORMAL HIGH (ref 0.350–4.500)

## 2017-03-24 MED ORDER — DIAZEPAM 5 MG PO TABS
5.0000 mg | ORAL_TABLET | Freq: Two times a day (BID) | ORAL | Status: DC
  Administered 2017-03-24 – 2017-03-25 (×2): 5 mg via ORAL
  Filled 2017-03-24 (×2): qty 1

## 2017-03-24 MED ORDER — POLYETHYLENE GLYCOL 3350 17 G PO PACK
17.0000 g | PACK | Freq: Two times a day (BID) | ORAL | Status: DC
  Administered 2017-03-24 – 2017-03-29 (×10): 17 g via ORAL
  Filled 2017-03-24 (×10): qty 1

## 2017-03-24 MED ORDER — LEVOTHYROXINE SODIUM 25 MCG PO TABS
25.0000 ug | ORAL_TABLET | Freq: Every day | ORAL | Status: DC
  Administered 2017-03-24 – 2017-03-29 (×6): 25 ug via ORAL
  Filled 2017-03-24 (×6): qty 1

## 2017-03-24 MED ORDER — MELOXICAM 7.5 MG PO TABS
15.0000 mg | ORAL_TABLET | Freq: Every day | ORAL | Status: DC
  Administered 2017-03-24 – 2017-03-29 (×6): 15 mg via ORAL
  Filled 2017-03-24 (×6): qty 2

## 2017-03-24 MED ORDER — VITAMIN D 1000 UNITS PO TABS
5000.0000 [IU] | ORAL_TABLET | Freq: Every day | ORAL | Status: DC
  Administered 2017-03-24 – 2017-03-29 (×6): 5000 [IU] via ORAL
  Filled 2017-03-24 (×7): qty 5

## 2017-03-24 MED ORDER — CARBAMAZEPINE 200 MG PO TABS
200.0000 mg | ORAL_TABLET | Freq: Every day | ORAL | Status: DC
  Administered 2017-03-24 – 2017-03-28 (×5): 200 mg via ORAL
  Filled 2017-03-24 (×5): qty 1

## 2017-03-24 MED ORDER — ALBUTEROL SULFATE HFA 108 (90 BASE) MCG/ACT IN AERS
2.0000 | INHALATION_SPRAY | Freq: Four times a day (QID) | RESPIRATORY_TRACT | Status: DC | PRN
  Administered 2017-03-25 – 2017-03-26 (×2): 2 via RESPIRATORY_TRACT
  Filled 2017-03-24 (×2): qty 6.7

## 2017-03-24 MED ORDER — NICOTINE 21 MG/24HR TD PT24
21.0000 mg | MEDICATED_PATCH | Freq: Every day | TRANSDERMAL | Status: DC
  Administered 2017-03-24 – 2017-03-29 (×6): 21 mg via TRANSDERMAL
  Filled 2017-03-24 (×6): qty 1

## 2017-03-24 MED ORDER — GABAPENTIN 300 MG PO CAPS
600.0000 mg | ORAL_CAPSULE | Freq: Three times a day (TID) | ORAL | Status: DC
  Administered 2017-03-24 – 2017-03-29 (×15): 600 mg via ORAL
  Filled 2017-03-24 (×16): qty 2

## 2017-03-24 MED ORDER — PRAZOSIN HCL 2 MG PO CAPS
2.0000 mg | ORAL_CAPSULE | Freq: Every day | ORAL | Status: DC
  Administered 2017-03-24 – 2017-03-25 (×2): 2 mg via ORAL
  Filled 2017-03-24 (×2): qty 1

## 2017-03-24 MED ORDER — CARISOPRODOL 350 MG PO TABS
175.0000 mg | ORAL_TABLET | Freq: Two times a day (BID) | ORAL | Status: DC
  Administered 2017-03-24 – 2017-03-29 (×10): 175 mg via ORAL
  Filled 2017-03-24 (×10): qty 1

## 2017-03-24 MED ORDER — OXYCODONE HCL 5 MG PO TABS
15.0000 mg | ORAL_TABLET | ORAL | Status: DC | PRN
  Administered 2017-03-24 – 2017-03-28 (×6): 15 mg via ORAL
  Filled 2017-03-24 (×6): qty 3

## 2017-03-24 MED ORDER — MORPHINE SULFATE ER 15 MG PO TBCR
15.0000 mg | EXTENDED_RELEASE_TABLET | Freq: Two times a day (BID) | ORAL | Status: DC
  Administered 2017-03-24 – 2017-03-29 (×10): 15 mg via ORAL
  Filled 2017-03-24 (×10): qty 1

## 2017-03-24 MED ORDER — VITAMIN D 1000 UNITS PO TABS
1000.0000 [IU] | ORAL_TABLET | Freq: Every day | ORAL | Status: DC
  Filled 2017-03-24: qty 1

## 2017-03-24 NOTE — Tx Team (Signed)
Initial Treatment Plan 03/24/2017 11:20 AM Katelyn Galloway ZOX:[id number]    PATIENT STRESSORS: Health problems Substance abuse   PATIENT STRENGTHS: Average or above average intelligence Communication skills Supportive family/friends   PATIENT IDENTIFIED PROBLEMS: Major depressive disorder 03/24/17  Substance abuse  03/24/17                   DISCHARGE CRITERIA:  Adequate post-discharge living arrangements Verbal commitment to aftercare and medication compliance Withdrawal symptoms are absent or subacute and managed without 24-hour nursing intervention  PRELIMINARY DISCHARGE PLAN: Attend aftercare/continuing care group Return to previous living arrangement  PATIENT/FAMILY INVOLVEMENT: This treatment plan has been presented to and reviewed with the patient, [name], and/or family member, .  The patient and family have been given the opportunity to ask questions and make suggestions.  Lenny Pastel, RN 03/24/2017, 11:20 AM

## 2017-03-24 NOTE — [facility] Group Notes (Signed)
  [facility] LCSW Group Therapy Note  Date/Time: 03/24/17, 0930  Type of Therapy/Topic:  Group Therapy:  Emotion Regulation  Participation Level:  Active   Mood: pleasant  Description of Group:    The purpose of this group is to assist patients in learning to regulate negative emotions and experience positive emotions. Patients will be guided to discuss ways in which they have been vulnerable to their negative emotions. These vulnerabilities will be juxtaposed with experiences of positive emotions or situations, and patients challenged to use positive emotions to combat negative ones. Special emphasis will be placed on coping with negative emotions in conflict situations, and patients will process healthy conflict resolution skills.  Therapeutic Goals: 1. Patient will identify two positive emotions or experiences to reflect on in order to balance out negative emotions:  2. Patient will label two or more emotions that they find the most difficult to experience:  3. Patient will be able to demonstrate positive conflict resolution skills through discussion or role plays:   Summary of Patient Progress: Pt identified anger and inferiority as feelings that she has difficulty experiencing.  Pt shared with the group regarding interactions with her husband that lead to her feeling strong emotions as well as thoughts on how to work through some of the conflict in her marriage.       Therapeutic Modalities:   Cognitive Behavioral Therapy Feelings Identification Dialectical Behavioral Therapy  Daleen Squibb, LCSW
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
## 2017-03-24 NOTE — Progress Notes (Signed)
Pt visible in mileiu, interacting with other patients. Pt complaining of pain in hip (sciatic nerve) 7/10. PRN medication administered as ordered.  Pt upset when unable to use the phone to give husband her pass code. RN explained phone hours. Pt accepting, returned to her room.  Pt reported less pain this evening (4/10)  Emotional support and encouragement offered. Medication education provided.

## 2017-03-24 NOTE — Progress Notes (Signed)
Pt is alert and oriented on admission. Pt mood is depressed and affect is sad. Pt is tearful and expressing hopelessness as well as passive SI, although she does contract for safety. Pt states that her father committed suicide this past October 22, 2023 and her dog passed away the following day. Pt reports that her condition worsened after an out patient appointment in february at which they adjusted her medications and made things worst. Pt has chronic back pain and a surgical scar on thoracic spine as well as a TENS unit implanted on LLB which can be charged transcutaneously. Skin is otherwise unremarkable and no contraband found on patient or in belongings. Pt does not have the TENS charger with her but Clinical research associate informed that a family member could bring it to the unit. Writer discussed tx plan and encouraged her to work with tx team in order to establish an action plan for coping with stressors. Pt acknowledges understanding and is pleasant ans cooperative with staff. Writer oriented pt to the milieu and 15 minute checks are ongoing for safety.

## 2017-03-24 NOTE — Progress Notes (Signed)
Recreation Therapy Notes  INPATIENT RECREATION THERAPY ASSESSMENT  Patient Details Name: Katelyn Galloway MRN: [id number] DOB: 12/30/1960 Today's Date: 03/24/2017  Patient Stressors: Family, Relationship, Death, Other (Comment) (Family and children have nothing to do withher; problems with husband due to her mental illness and him being burned out; dad killed himself in Oct and her had to put her dog down the day afterwards; "Everything")  Coping Skills:   Isolate, Arguments, Substance Abuse, Avoidance, Exercise, Art/Dance, Talking, Music, Other (Comment) (Puzzles)  Personal Challenges: Communication, Concentration, Decision-Making, Expressing Yourself, Problem-Solving, Relationships, Self-Esteem/Confidence, Social Interaction, Stress Management, Time Management  Leisure Interests (2+):  Individual - Other (Comment) (Dance, go to the movies)  Awareness of Community Resources:  Yes  Community Resources:  [place], [place]  Current Use: Yes  If no, Barriers?:    Patient Strengths:  Long hair, blue, eyes  Patient Identified Areas of Improvement:  Weight, toning her body, friendships  Current Recreation Participation:  Puzzles and watching Netflix  Patient Goal for Hospitalization:  To get medicine right  [place] of Residence:  [place] of Residence:  [place]   Current [place] (including self-harm):  Yes ("I want to make my medicine, go to bed, and not wake up. I don't want to be a bother to anyone.")  Current [place]:  No  Consent to Intern Participation: N/A   Jacquelynn Cree, LRT/CTRS 03/24/2017, 3:44 PM

## 2017-03-24 NOTE — Progress Notes (Signed)
Recreation Therapy Notes  Date: 04.04.18 Time: 1:00 pm Location: Craft Room  Group Topic: Self-esteem  Goal Area(s) Addresses:  Patients will write at least one positive trait about themselves. Patients will verbalize benefit of having a healthy self-esteem.  Behavioral Response: Did not attend  Intervention: I Am  Activity: Patients were given a worksheet with the letter I on it and were instructed to write as many positive traits about themselves inside the letter.  Education: LRT educated patients on ways they can improve their self-esteem.  Education Outcome: Patient did not attend group.  Clinical Observations/Feedback: Patient did not attend group.  Jacquelynn Cree, LRT/CTRS 03/24/2017 1:47 PM
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
## 2017-03-24 NOTE — BHH Group Notes (Signed)
BHH Group Notes:  (Nursing/MHT/Case Management/Adjunct)  Date:  03/24/2017  Time:  5:21 PM  Type of Therapy:  Psychoeducational Skills  Participation Level:  Active  Participation Quality:  Appropriate and Sharing  Affect:  Appropriate  Cognitive:  Appropriate  Insight:  Appropriate  Engagement in Group:  Engaged  Modes of Intervention:  Socialization  Summary of Progress/Problems:  Lynelle Smoke Bensyn Bornemann 03/24/2017, 5:21 PM

## 2017-03-25 LAB — HEMOGLOBIN A1C
Hgb A1c MFr Bld: 5.2 % (ref 4.8–5.6)
MEAN PLASMA GLUCOSE: 103 mg/dL

## 2017-03-25 MED ORDER — LIDOCAINE 5 % EX PTCH
1.0000 | MEDICATED_PATCH | CUTANEOUS | Status: DC
  Administered 2017-03-25 – 2017-03-28 (×4): 1 via TRANSDERMAL
  Filled 2017-03-25 (×7): qty 1

## 2017-03-25 MED ORDER — CHLORHEXIDINE GLUCONATE 0.12 % MT SOLN
15.0000 mL | Freq: Two times a day (BID) | OROMUCOSAL | Status: DC
  Administered 2017-03-25 – 2017-03-26 (×2): 15 mL via OROMUCOSAL
  Filled 2017-03-25 (×5): qty 15

## 2017-03-25 MED ORDER — DIAZEPAM 2 MG PO TABS: 2.0000 mg | ORAL_TABLET | Freq: Two times a day (BID) | ORAL | Status: DC

## 2017-03-25 MED ORDER — DIAZEPAM 5 MG PO TABS
5.0000 mg | ORAL_TABLET | Freq: Two times a day (BID) | ORAL | Status: DC
  Administered 2017-03-25 – 2017-03-29 (×8): 5 mg via ORAL
  Filled 2017-03-25 (×8): qty 1

## 2017-03-25 NOTE — Progress Notes (Signed)
Recreation Therapy Notes  At approximately 10:50 am, LRT attempted treatment session. Patient sleeping and would not wake up when name was called.  Jacquelynn Cree, LRT/CTRS 03/25/2017 10:52 AM
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
## 2017-03-25 NOTE — Plan of Care (Signed)
Problem: Activity: Goal: Sleeping patterns will improve Outcome: Progressing Patient slept for Estimated Hours of 5.15; every 15 minutes safety round maintained, no injury or falls during this shift.    

## 2017-03-25 NOTE — Progress Notes (Signed)
Recreation Therapy Notes  Date: 04.05.18 Time: 9:30 am Location: Craft Room  Group Topic: Leisure Education  Goal Area(s) Addresses:  Patient will identify what type of leisure they prefer (health and wellness/social/etc.) Patient will identify one activity they would like to participate in that addresses their personal leisure interest. Patient will identify one leisure activity they currently participate in that addresses their personal leisure interest.  Behavioral Response: Did not attend  Intervention: Leisure Skills Checklist  Activity: Patients were given a Leisure Skills Checklist and were instructed to complete the worksheet and add their scores.  Education: LRT educated patients on what they need to participate in leisure.  Education Outcome: Patient did not attend group.   Clinical Observations/Feedback: Patient did not attend group.  Jacquelynn Cree, LRT/CTRS 03/25/2017 10:27 AM

## 2017-03-25 NOTE — [facility] Group Notes (Signed)
[facility] LCSW Group Therapy  03/25/2017 2:06 PM  Type of Therapy:  Group Therapy  Participation Level:  Active  Participation Quality:  Attentive, Monopolizing and Sharing  Affect:  Anxious and Appropriate  Cognitive:  Alert  Insight:  Improving and Monopolizing  Engagement in Therapy:  Monopolizing  Modes of Intervention:  Activity, Discussion, Education, Problem-solving, Dance movement psychotherapist, Socialization and Support  Summary of Progress/Problems: Balance in life: Patients will discuss the concept of balance and how it looks and feels to be unbalanced. Pt will identify areas in their life that is unbalanced and ways to become more balanced. They discussed what aspects in their lives has influenced their self care. Patients also discussed self care in the areas of self regulation/control, hygiene/appearance, sleep/relaxation, healthy leisure, healthy eating habits, exercise, inner peace/spirituality, self improvement, sobriety, and health management. They were challenged to identify changes that are needed in order to improve self care. Patient had difficulty remaining on the group topic and needing some redirection. Patient discussed martial issues and had support from CSW and peers.   Elhadj Girton G. Garnette Czech MSW, LCSWA 03/25/2017, 2:20 PM
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
## 2017-03-25 NOTE — Progress Notes (Signed)
Pt presents with pained affect- c/o pain, 8/10,  in lower back .Pt compliant with meds. Pt rates her depression and feelings of hopelessness a 6/10 and anxiety a 7/10. Pt reports passive SI - on and off- but agrees to contact staff before acting on any harmful thoughts. Labs and vitals monitored. Pt supported emotionally and encouraged to express concerns and ask questions.    Pt remains safe with 15 minute checks.

## 2017-03-25 NOTE — [facility] Group Notes (Signed)
[facility] LCSW Group Therapy Note  Type of Therapy and Topic:  Group Therapy:  Goals Group: SMART Goals  Participation Level:  Patient did not attend group. CSW invited patient to group.   Description of Group:   The purpose of a daily goals group is to assist and guide patients in setting recovery/wellness-related goals.  The objective is to set goals as they relate to the crisis in which they were admitted. Patients will be using SMART goal modalities to set measurable goals.  Characteristics of realistic goals will be discussed and patients will be assisted in setting and processing how one will reach their goal. Facilitator will also assist patients in applying interventions and coping skills learned in psycho-education groups to the SMART goal and process how one will achieve defined goal.  Therapeutic Goals: -Patients will develop and document one goal related to or their crisis in which brought them into treatment. -Patients will be guided by LCSW using SMART goal setting modality in how to set a measurable, attainable, realistic and time sensitive goal.  -Patients will process barriers in reaching goal. -Patients will process interventions in how to overcome and successful in reaching goal.   Summary of Patient Progress:  Patient Goal: Patient did not attend group. CSW invited patient to group.    Therapeutic Modalities:   Motivational Interviewing Engineer, manufacturing systems Therapy Crisis Intervention Model SMART goals setting  Katelyn Galloway MSW, LCSWA 03/25/2017 10:07 AM

## 2017-03-25 NOTE — Progress Notes (Signed)
Patient ID: Katelyn Galloway, female   DOB: 11-Aug-1961, 56 y.o.   MRN: [id number] Attention and medication seeking, over friendly, interacting with others, limit set when patient was holding hands and hugging other males, happy mood and affect, still c/o lower back pain, she used her TEENS remote x 1 prior to MN. Denied SI/HI, Denied AV/H.

## 2017-03-25 NOTE — [facility] Group Notes (Signed)
[facility] Group Notes:  (Nursing/MHT/Case Management/Adjunct)  Date:  03/25/2017  Time:  12:33 AM  Type of Therapy:  Group Therapy  Participation Level:  Active  Participation Quality:  Appropriate and Sharing  Affect:  Appropriate and Tearful  Cognitive:  Appropriate  Insight:  Good  Engagement in Group:  Developing/Improving and Engaged  Modes of Intervention:  Discussion  Summary of Progress/Problems: Pt. Stated that her goal was to interact, which she had done and felt a lot better. She said that she had felt happier than she had been in a long time.   Katelyn Galloway Eduardo Honor 03/25/2017, 12:33 AM

## 2017-03-26 DIAGNOSIS — F603 Borderline personality disorder: Secondary | ICD-10-CM

## 2017-03-26 MED ORDER — PRAZOSIN HCL 2 MG PO CAPS
2.0000 mg | ORAL_CAPSULE | Freq: Every day | ORAL | Status: DC
  Administered 2017-03-26 – 2017-03-28 (×3): 2 mg via ORAL
  Filled 2017-03-26 (×3): qty 1

## 2017-03-26 MED ORDER — PRAZOSIN HCL 2 MG PO CAPS: 3.0000 mg | ORAL_CAPSULE | Freq: Every day | ORAL | Status: DC

## 2017-03-26 NOTE — Plan of Care (Signed)
Problem: Coping: Goal: Ability to verbalize feelings will improve Outcome: Progressing Able to make needs known, redirection provided as needed.

## 2017-03-26 NOTE — [facility] Group Notes (Signed)
[facility] Group Notes:  (Nursing/MHT/Case Management/Adjunct)  Date:  03/26/2017  Time:  2:16 AM  Type of Therapy:  Psychoeducational Skills  Participation Level:  Active  Participation Quality:  Appropriate  Affect:  Appropriate  Cognitive:  Alert  Insight:  Good  Engagement in Group:  Engaged  Modes of Intervention:  Support  Summary of Progress/Problems:  Katelyn Galloway 03/26/2017, 2:16 AM

## 2017-03-26 NOTE — [facility] Group Notes (Signed)
[facility] LCSW Group Therapy  03/26/2017 10:44 AM  Type of Therapy:  Group Therapy  Participation Level:  Active  Participation Quality:  Attentive and Sharing  Affect:  Anxious, Depressed and Tearful  Cognitive:  Alert  Insight:  Limited  Engagement in Therapy:  Engaged  Modes of Intervention:  Activity, Discussion, Education, Limit-setting, Problem-solving, Dance movement psychotherapist, Socialization and Support  Summary of Progress/Problems: Stress management: Patients defined and discussed the topic of stress and the related symptoms and triggers for stress. Patients identified healthy coping skills they would like to try during hospitalization and after discharge to manage stress in a healthy way. CSW offered insight to varying stress management techniques. Patient arrived to group late and was tearful and crying when she entered the room. When CSW checked in with patient, patient stated she had a bad nightmare about her children and she was not coping well. CSW provided support to patient and offered encouragement for coming to group. Patient stated she would inform her nurse and doctor about the nightmare.   Mala Gibbard G. Garnette Czech MSW, LCSWA 03/26/2017, 10:47 AM

## 2017-03-26 NOTE — Progress Notes (Signed)
Recreation Therapy Notes  Date:04.06.18 Time:1:00 pm Location:Craft Room  Group Topic:Social Skills  Goal Area(s) Addresses: Patient will work in teams towards shared goal. Patient will verbalize skills needed to make activity successful Patient will verbalize benefit of using skills identified to reach post d/c goals  Behavioral Response: Attentive, Interactive  Intervention: Landing Pad  Activity:Patients were put in groups and given 15 straws and approximately 3 feet of tape. Patients were instructed to build a landing pad that would catch and secure a golf ball that would be dropped from approximately 4 feet.  Education:LRT educated patients on healthy support systems.  Education Outcome: Acknowledges education/In group clarification offered  Clinical Observations/Feedback:Patient worked with peers towards shared goal. Patient used effective communication, problem solving, and Forensic psychologist. Patient contributed to group discussion by stating her team was successful, what skills they used, and why communication, problem solving, and teamwork are important skills.  Jacquelynn Cree, LRT/CTRS 03/26/2017 4:32 PM
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
## 2017-03-26 NOTE — Plan of Care (Signed)
Problem: Activity: Goal: Sleeping patterns will improve Outcome: Progressing Patient slept for Estimated Hours of 4.45; every 15 minutes safety round maintained, no injury or falls during this shift.    

## 2017-03-26 NOTE — Progress Notes (Signed)
AAOx4 on unit, compliant with care. Demonstrates medication seeking behavior and reports multiple somatic complaints. Visible on unit, attending groups. Denies SI.HI.AVH. Will continue to monitor and provide cares as needed.

## 2017-03-26 NOTE — Tx Team (Signed)
Interdisciplinary Treatment and Diagnostic Plan Update  03/26/2017 Time of Session: 11:00am Katelyn Galloway MRN: [id number]  Principal Diagnosis: Bipolar affective disorder, depressed (HCC)  Secondary Diagnoses: Principal Problem:   Bipolar affective disorder, depressed (HCC) Active Problems:   Cannabis abuse   Chronic bilateral low back pain   GAD (generalized anxiety disorder)   Hypothyroidism   Asthma   Tobacco use disorder   Cluster b traits (borderline and histrionoc)   Current Medications:  Current Facility-Administered Medications  Medication Dose Route Frequency Provider Last Rate Last Dose  . acetaminophen (TYLENOL) tablet 650 mg  650 mg Oral Q6H PRN Audery Amel, MD   650 mg at 03/26/17 1133  . albuterol (PROVENTIL HFA;VENTOLIN HFA) 108 (90 Base) MCG/ACT inhaler 2 puff  2 puff Inhalation Q6H PRN Jimmy Footman, MD   2 puff at 03/25/17 2304  . alum & mag hydroxide-simeth (MAALOX/MYLANTA) 200-200-20 MG/5ML suspension 30 mL  30 mL Oral Q4H PRN Audery Amel, MD      . carbamazepine (TEGRETOL) tablet 200 mg  200 mg Oral QHS Jimmy Footman, MD   200 mg at 03/25/17 2136  . carisoprodol (SOMA) tablet 175 mg  175 mg Oral BID Jimmy Footman, MD   175 mg at 03/26/17 0836  . chlorhexidine (PERIDEX) 0.12 % solution 15 mL  15 mL Mouth/Throat BID Jimmy Footman, MD   15 mL at 03/26/17 4098  . cholecalciferol (VITAMIN D) tablet 5,000 Units  5,000 Units Oral Daily Jimmy Footman, MD   5,000 Units at 03/26/17 [phone]  . diazepam (VALIUM) tablet 5 mg  5 mg Oral BID Jimmy Footman, MD   5 mg at 03/26/17 0834  . gabapentin (NEURONTIN) capsule 600 mg  600 mg Oral TID Jimmy Footman, MD   600 mg at 03/26/17 1131  . levothyroxine (SYNTHROID, LEVOTHROID) tablet 25 mcg  25 mcg Oral QAC breakfast Jimmy Footman, MD   25 mcg at 03/26/17 0835  . lidocaine (LIDODERM) 5 % 1 patch  1 patch Transdermal Q24H Jimmy Footman, MD   1 patch at 03/25/17 1606  . magnesium hydroxide (MILK OF MAGNESIA) suspension 30 mL  30 mL Oral Daily PRN Audery Amel, MD      . meloxicam (MOBIC) tablet 15 mg  15 mg Oral Daily Jimmy Footman, MD   15 mg at 03/26/17 0834  . morphine (MS CONTIN) 12 hr tablet 15 mg  15 mg Oral Q12H Jimmy Footman, MD   15 mg at 03/26/17 0834  . nicotine (NICODERM CQ - dosed in mg/24 hours) patch 21 mg  21 mg Transdermal Daily Jimmy Footman, MD   21 mg at 03/26/17 4782  . oxyCODONE (Oxy IR/ROXICODONE) immediate release tablet 15 mg  15 mg Oral Q4H PRN Jimmy Footman, MD   15 mg at 03/26/17 0529  . polyethylene glycol (MIRALAX / GLYCOLAX) packet 17 g  17 g Oral BID Jimmy Footman, MD   17 g at 03/26/17 0834  . prazosin (MINIPRESS) capsule 2 mg  2 mg Oral QHS Jimmy Footman, MD      . venlafaxine XR (EFFEXOR-XR) 24 hr capsule 150 mg  150 mg Oral Q breakfast Audery Amel, MD   150 mg at 03/26/17 9562   PTA Medications: Prescriptions Prior to Admission  Medication Sig Dispense Refill Last Dose  . albuterol (PROVENTIL HFA;VENTOLIN HFA) 108 (90 Base) MCG/ACT inhaler INHALE TWO PUFFS BY MOUTH EVERY 6 HOURS AS NEEDED FOR WHEEZING OR FOR SHORTNESS OF BREATH   03/21/2017 at Unknown time  .  carbamazepine (TEGRETOL) 200 MG tablet Take 200 mg by mouth at bedtime.    03/21/2017 at 2300  . carisoprodol (SOMA) 350 MG tablet Take 350 mg by mouth 2 (two) times daily. spasm   03/22/2017 at Unknown time  . Cholecalciferol (VITAMIN D3) 5000 units CAPS Take by mouth.   03/21/2017 at Unknown time  . Cranberry 1000 MG CAPS Take 2 capsules by mouth daily.   03/21/2017 at Unknown time  . diazepam (VALIUM) 5 MG tablet Take 10 mg by mouth 2 (two) times daily.    03/22/2017 at Unknown time  . DULoxetine (CYMBALTA) 30 MG capsule Take 30 mg by mouth 2 (two) times daily.    03/22/2017 at Unknown time  . FLUoxetine (PROZAC) 20 MG capsule Take 20 mg by mouth at  bedtime.    03/21/2017 at Unknown time  . gabapentin (NEURONTIN) 600 MG tablet Take 600 mg by mouth 3 (three) times daily.    03/22/2017 at Unknown time  . hydroxypropyl methylcellulose (ISOPTO TEARS) 2.5 % ophthalmic solution Place 1 drop into both eyes at bedtime.   03/21/2017 at Unknown time  . hydrOXYzine (ATARAX/VISTARIL) 25 MG tablet Take 25 mg by mouth daily as needed for anxiety.    Past Week at Unknown time  . levothyroxine (SYNTHROID, LEVOTHROID) 25 MCG tablet Take 25 mcg by mouth daily before breakfast.    03/21/2017 at Unknown time  . meloxicam (MOBIC) 15 MG tablet Take 15 mg by mouth daily.    03/22/2017 at Unknown time  . Menthol-Methyl Salicylate (MUSCLE RUB) 10-15 % CREA Apply 1 application topically daily as needed. Joint pain   unknown  . morphine (MS CONTIN) 15 MG 12 hr tablet Take 15 mg by mouth every 12 (twelve) hours.   03/22/2017 at Unknown time  . Multiple Vitamin (MULTIVITAMIN) capsule Take 1 capsule by mouth daily.    03/22/2017 at Unknown time  . oxyCODONE (ROXICODONE) 15 MG immediate release tablet Take 15 mg by mouth every 4 (four) hours as needed for pain (for breakthrough pain).   Past Week at Unknown time  . oxyCODONE (ROXICODONE) 5 MG immediate release tablet Take 1 tablet (5 mg total) by mouth every 4 (four) hours as needed for severe pain (if needed in addition to previous prescription). (Patient not taking: Reported on 03/22/2017) 45 tablet 0 Completed Course at Unknown time  . polyvinyl alcohol-povidone (HYPOTEARS) 1.4-0.6 % ophthalmic solution Place 1 drop into both eyes daily as needed (dry eyes).    03/21/2017 at Unknown time  . Polyvinyl Alcohol-Povidone (REFRESH OP) Apply 1 drop to eye as needed (dry eyes).   03/21/2017 at Unknown time  . prazosin (MINIPRESS) 1 MG capsule Take 2 mg by mouth at bedtime.    03/21/2017 at Unknown time  . Probiotic Product (PHILLIPS COLON HEALTH PO) Take 1 capsule by mouth daily.   03/21/2017 at Unknown time  . QUEtiapine (SEROQUEL) 50 MG tablet Take 50  mg by mouth at bedtime.    03/21/2017 at Unknown time  . Wheat Dextrin (BENEFIBER PO) Take 1 packet by mouth daily. miralax   03/22/2017 at Unknown time    Patient Stressors: Health problems Substance abuse  Patient Strengths: Average or above average intelligence Communication skills Supportive family/friends  Treatment Modalities: Medication Management, Group therapy, Case management,  1 to 1 session with clinician, Psychoeducation, Recreational therapy.   Physician Treatment Plan for Primary Diagnosis: Bipolar affective disorder, depressed (HCC) Long Term Goal(s): Improvement in symptoms so as ready for discharge Improvement in symptoms  so as ready for discharge   Short Term Goals: Ability to identify changes in lifestyle to reduce recurrence of condition will improve Ability to disclose and discuss suicidal ideas Ability to identify and develop effective coping behaviors will improve Ability to verbalize feelings will improve Ability to disclose and discuss suicidal ideas  Medication Management: Evaluate patient's response, side effects, and tolerance of medication regimen.  Therapeutic Interventions: 1 to 1 sessions, Unit Group sessions and Medication administration.  Evaluation of Outcomes: Progressing  Physician Treatment Plan for Secondary Diagnosis: Principal Problem:   Bipolar affective disorder, depressed (HCC) Active Problems:   Cannabis abuse   Chronic bilateral low back pain   GAD (generalized anxiety disorder)   Hypothyroidism   Asthma   Tobacco use disorder   Cluster b traits (borderline and histrionoc)  Long Term Goal(s): Improvement in symptoms so as ready for discharge Improvement in symptoms so as ready for discharge   Short Term Goals: Ability to identify changes in lifestyle to reduce recurrence of condition will improve Ability to disclose and discuss suicidal ideas Ability to identify and develop effective coping behaviors will improve Ability to  verbalize feelings will improve Ability to disclose and discuss suicidal ideas     Medication Management: Evaluate patient's response, side effects, and tolerance of medication regimen.  Therapeutic Interventions: 1 to 1 sessions, Unit Group sessions and Medication administration.  Evaluation of Outcomes: Progressing   RN Treatment Plan for Primary Diagnosis: Bipolar affective disorder, depressed (HCC) Long Term Goal(s): Knowledge of disease and therapeutic regimen to maintain health will improve  Short Term Goals: Ability to verbalize feelings will improve, Ability to disclose and discuss suicidal ideas, Ability to identify and develop effective coping behaviors will improve and Compliance with prescribed medications will improve  Medication Management: RN will administer medications as ordered by provider, will assess and evaluate patient's response and provide education to patient for prescribed medication. RN will report any adverse and/or side effects to prescribing provider.  Therapeutic Interventions: 1 on 1 counseling sessions, Psychoeducation, Medication administration, Evaluate responses to treatment, Monitor vital signs and CBGs as ordered, Perform/monitor CIWA, COWS, AIMS and Fall Risk screenings as ordered, Perform wound care treatments as ordered.  Evaluation of Outcomes: Progressing   LCSW Treatment Plan for Primary Diagnosis: Bipolar affective disorder, depressed (HCC) Long Term Goal(s): Safe transition to appropriate next level of care at discharge, Engage patient in therapeutic group addressing interpersonal concerns.  Short Term Goals: Engage patient in aftercare planning with referrals and resources and Increase ability to appropriately verbalize feelings  Therapeutic Interventions: Assess for all discharge needs, 1 to 1 time with Social worker, Explore available resources and support systems, Assess for adequacy in community support network, Educate family and  significant other(s) on suicide prevention, Complete Psychosocial Assessment, Interpersonal group therapy.  Evaluation of Outcomes: Progressing    Recreational Therapy Treatment Plan for Primary Diagnosis: Bipolar affective disorder, depressed (HCC) Long Term Goal(s): Patient will participate in recreation therapy treatment in at least 2 group sessions without prompting from LRT  Short Term Goals: Increase self-esteem, Increase stress management skills  Treatment Modalities: Group Therapy and Individual Treatment Sessions  Therapeutic Interventions: Psychoeducation  Evaluation of Outcomes: Progressing   Progress in Treatment: Attending groups: Yes. Participating in groups: Yes. Taking medication as prescribed: Yes. Toleration medication: Yes. Family/Significant other contact made: No, will contact:  patient's husband Patient understands diagnosis: Yes. Discussing patient identified problems/goals with staff: Yes. Medical problems stabilized or resolved: Yes. Denies suicidal/homicidal ideation: Yes. Issues/concerns per patient  self-inventory: No. Other: n/a  New problem(s) identified: None identified at this time.   New Short Term/Long Term Goal(s): Patient wants to develop new coping strategies to manage her anxiety and depression.   Discharge Plan or Barriers: Patient will discharge home and follow-up with outpatient services for medication management and outpatient therapy.   Reason for Continuation of Hospitalization: Anxiety Depression Medication stabilization  Estimated Length of Stay: 5 to 7 days.   Attendees: Patient: Katelyn Galloway 03/26/2017 2:02 PM  Physician: Dr. Radene JourneyJayme Cloud, MD 03/26/2017 2:02 PM  Nursing: Leonia Reader, RN 03/26/2017 2:02 PM  RN Care Manager: 03/26/2017 2:02 PM  Social Worker: Fredrich Birks. Garnette Czech MSW, LCSWA 03/26/2017 2:02 PM  Recreational Therapist: Jacquelynn Cree, LRT/CTRS 03/26/2017 2:02 PM  Other:  03/26/2017 2:02 PM  Other:   03/26/2017 2:02 PM  Other: 03/26/2017 2:02 PM    Scribe for Treatment Team: Arelia Longest, LCSWA 03/26/2017 2:06 PM
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
## 2017-03-26 NOTE — Plan of Care (Signed)
Problem: [facility] Participation in Recreation Therapeutic Interventions Goal: STG-Patient will demonstrate improved self esteem by identif STG: Self-Esteem - Within 4 treatment sessions, patient will verbalize at least 5 positive affirmation statements in each of 2 treatment sessions to increase self-esteem.  Outcome: Progressing Treatment Session 1; Completed 1 out of 2: At approximately 9:15 am, LRT met with patient in consultation room. Patient verbalized 5 positive affirmation statements. Patient reported it felt "good". LRT encouraged patient to continue saying positive affirmation statements.  Leonette Monarch, LRT/CTRS 04.06.18 3:15 pm Goal: STG-Other Recreation Therapy Goal (Specify) STG: Stress Management - Within 4 treatment sessions, patient will verbalize understanding of the stress management techniques in each of 2 treatment sessions to increase stress management skills.  Outcome: Progressing Treatment Session 1; Completed 1 out of 2: At approximately 9:15 am, LRT met with patient in consultation room. LRT educated and provided patient with handouts on stress management techniques. Patient verbalized understanding. LRT encouraged patient to read over and practice the stress management techniques.  Leonette Monarch, LRT/CTRS 04.06.18 3:17 pm

## 2017-03-27 NOTE — [facility] Suicide Risk Assessment (Signed)
[facility] INPATIENT:  Family/Significant Other Suicide Prevention Education  Suicide Prevention Education:  Education Completed;Molly Maduro [id number](NWGNFAO [phone]), has been identified by the patient as the family member/significant other with whom the patient will be residing, and identified as the person(s) who will aid the patient in the event of a mental health crisis (suicidal ideations/suicide attempt).  With written consent from the patient, the family member/significant other has been provided the following suicide prevention education, prior to the and/or following the discharge of the patient.  The suicide prevention education provided includes the following:  Suicide risk factors  Suicide prevention and interventions  National Suicide Hotline telephone number  [facility] assessment telephone number  [facility] Emergency Assistance 911  [facility] and/or Residential Mobile Crisis Unit telephone number  Request made of family/significant other to:  Remove weapons (e.g., guns, rifles, knives), all items previously/currently identified as safety concern.    Remove drugs/medications (over-the-counter, prescriptions, illicit drugs), all items previously/currently identified as a safety concern.  The family member/significant other verbalizes understanding of the suicide prevention education information provided.  The family member/significant other agrees to remove the items of safety concern listed above.  Ravenne Wayment G. Garnette Czech MSW, LCSWA 03/27/2017, 4:14 PM

## 2017-03-27 NOTE — Plan of Care (Signed)
Problem: Coping: Goal: Ability to cope will improve Outcome: Progressing Denies SI/HI/AVH, fixated on pain and medications. Appears depressed, but brightens on approach. Socializes in dayroom with peers, attends group.

## 2017-03-27 NOTE — [facility] Group Notes (Signed)
[facility] LCSW Group Therapy  03/27/2017 2:00 PM  Type of Therapy:  Group Therapy  Participation Level:  Active  Participation Quality:  Appropriate and Sharing  Affect:  Appropriate  Cognitive:  Alert  Insight:  Developing/Improving  Engagement in Therapy:  Developing/Improving  Modes of Intervention:  Activity, Clarification, Discussion, Education, Problem-solving, Reality Testing, Socialization and Support  Summary of Progress/Problems: Coping Skills: Patients defined and discussed healthy coping skills. Patients identified healthy coping skills they would like to try during hospitalization and after discharge. CSW offered insight to varying coping skills that may have been new to patients such as practicing mindfulness. Patient told CSW that she was in significant pain due to chronic back pain. CSW provided support to patient and discussed incorporating relaxation techniques into her daily schedule. Patient participated in the mindfulness medication exercise. Patient stated after the exercise that her pain level had significantly decreased and she was feeling better.   Alexsandra Shontz G. Garnette Czech MSW, LCSWA 03/27/2017, 2:05 PM

## 2017-03-27 NOTE — [facility] Group Notes (Signed)
[facility] Group Notes:  (Nursing/MHT/Case Management/Adjunct)  Date:  03/27/2017  Time:  12:48 AM  Type of Therapy:  Psychoeducational Skills  Participation Level:  Active  Participation Quality:  Sharing and Supportive  Affect:  Appropriate  Cognitive:  Alert, Appropriate and Oriented  Insight:  Good  Engagement in Group:  Engaged and Supportive  Modes of Intervention:  Discussion and Exploration  Summary of Progress/Problems:  Foy Guadalajara 03/27/2017, 12:48 AM

## 2017-03-27 NOTE — Plan of Care (Signed)
Problem: Coping: Goal: Ability to verbalize feelings will improve Outcome: Progressing Patient verbalized feelings to staff.    

## 2017-03-27 NOTE — Plan of Care (Signed)
Problem: Safety: Goal: Periods of time without injury will increase Outcome: Progressing Pt safe on the unit at this time   
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
## 2017-03-27 NOTE — Progress Notes (Signed)
Pt awake, alert, oriented and up on unit today. Pt constantly complaining of lower back pain although scheduled and PRN medications administered as ordered, requests more medication to be ordered. MD informed. Of note, pt was squatting down in the floor next to a peer sitting in a chair and was observed raising to standing position without difficulty. Lidoderm patch placed to lower back. Pt offers help/assistance to peers, socializes in dayroom. Attends group. Denies SI/HI/AVH. Pleasant and cooperative, appears depressed, but brightens on approach. TENS device continues to be available but without battery that pt's husband was supposed to bring but reportedly cannot find at home. Pt also states she does not know where the battery is located. Support and encouragement provided. Medications administered as ordered with education. Safety maintained with every 15 minute checks. Will continue to monitor.

## 2017-03-27 NOTE — Progress Notes (Signed)
D: Pt denies SI/HI/AVH. Pt is pleasant and cooperative. Pt focus is on her pain " I need to get this pain under control, I have a hard time letting things go". Pt seen interacting appropriately with peers / staff.   A: Pt was offered support and encouragement. Pt was given scheduled medications. Pt was encourage to attend groups. Q 15 minute checks were done for safety.   R:Pt attends groups and interacts well with peers and staff. Pt is taking medication. Pt receptive to treatment and safety maintained on unit.

## 2017-03-27 NOTE — Progress Notes (Signed)
D: Pt denies SI/HI/AVH. Pt is pleasant and cooperative. Pt stated she feels better from taking medication, she appears less anxious and she is interacting with peers and staff appropriately.  A: Pt was offered support and encouragement. Pt was given scheduled medications. Pt was encouraged to attend groups. Q 15 minute checks were done for safety.  R:Pt attends groups and interacts well with peers and staff. Pt is taking medication. Pt has no complaints.Pt receptive to treatment and safety maintained on unit.

## 2017-03-28 NOTE — Plan of Care (Signed)
Problem: Activity: Goal: Interest or engagement in leisure activities will improve Outcome: Progressing Patient was outside for the leisure activity. She seemed to enjoy it.

## 2017-03-28 NOTE — Progress Notes (Signed)
Patient states that she is still depressed, feeling hopeless and anxious. She attended leisure group and 1pm class. Per MHT staff, patient was very demanding and easily irritated during leisure group because she did not wan to listen to the music that other peers were listening to. She asked for earplugs so that he could "block out the noise." She explained to writer that she was a very negative person and that she did not need to listen to "all the noise." Patient was encouraged to try an alternative activity such as coloring or playing cards but she sat in the leisure activity. Patient continues to report pain. Reassurance provided. Patient denies SI/HI and contracts for safety.

## 2017-03-28 NOTE — [facility] Group Notes (Signed)
[facility] Group Notes:  (Nursing/MHT/Case Management/Adjunct)  Date:  03/28/2017  Time:  9:25 PM  Type of Therapy:  Evening Wrap-up Group  Participation Level:  Active  Participation Quality:  Appropriate and Attentive  Affect:  Appropriate  Cognitive:  Alert and Appropriate  Insight:  Improving  Engagement in Group:  Developing/Improving and Engaged  Modes of Intervention:  Discussion  Summary of Progress/Problems:  Katelyn Galloway 03/28/2017, 9:25 PM

## 2017-03-28 NOTE — [facility] Group Notes (Signed)
[facility] Group Notes:  (Nursing/MHT/Case Management/Adjunct)  Date:  03/28/2017  Time:  3:09 AM  Type of Therapy:  Psychoeducational Skills  Participation Level:  Active  Participation Quality:  Appropriate and Sharing  Affect:  Appropriate  Cognitive:  Appropriate  Insight:  Appropriate and Good  Engagement in Group:  Engaged  Modes of Intervention:  Discussion, Socialization and Support  Summary of Progress/Problems:  Chancy Milroy 03/28/2017, 3:09 AM

## 2017-03-28 NOTE — [facility] Group Notes (Signed)
April 07/2017 100 PM  Type of Therapy:  Group Therapy Emotional Regulation Participation Level:  Active  Participation Quality:  Attentive  Affect:  Appropriate  Cognitive:  Appropriate  Insight:  Engaged  Engagement in Therapy:  Developing/Improving  Modes of Intervention:  Activity, Clarification, Discussion, Education, Exploration and Socialization   The purpose of this group is to assist patients in learning to regulate negative emotions and experience positive emotions.  Patients will be guided to discuss ways in which they have been vulnerable to their negative emotions.  These vulnerabilities will be juxtaposed with experiences of positive emotions or situations, and  patients challenged to use positive emotions to combat negative ones.  Special emphasis will be placed on coping with negative emotions in conflict situations,  and patients will process healthy conflict resolution skills.    Therapeutic Goals:   1. Patient will identify two positive emotions or experiences to reflect on in order to balance out negative emotions:    2. Patient will label two or more emotions that they find the most difficult to experience:    3. Patient will be able to demonstrate positive conflict resolution skills through discussion or role plays:      Summary of Progress/Problems: The topic for today was Emotional Regulation Peers were asked to reflect on 2 incidents when they lives were unbalanced.  With facilitation from  group Leader those situations were reflected by patient and their peers.   Pt discussed coping skills that can be used for regulating emotions This patient was able to be supportive and this patients feelings was supported by this worker and peers Patient were then to review ways to improve thier emotions and to share options that assisted them in reducing thier own emotional disregulation ( taking medications, keep doctors appointments, create a daily  routine,excercize, volunteer, call friend/family and several stress reduction techniques were reviewed.)    Katelyn Galloway April 8,2018 2:30pm 
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
## 2017-03-29 MED ORDER — DIAZEPAM 5 MG PO TABS: 5.0000 mg | ORAL_TABLET | Freq: Two times a day (BID) | ORAL | 0 refills | Status: DC | PRN

## 2017-03-29 MED ORDER — VENLAFAXINE HCL ER 150 MG PO CP24: 150.0000 mg | ORAL_CAPSULE | Freq: Every day | ORAL | 0 refills | Status: DC

## 2017-03-29 MED ORDER — CARISOPRODOL 350 MG PO TABS: 175.0000 mg | ORAL_TABLET | Freq: Two times a day (BID) | ORAL | 0 refills | Status: DC

## 2017-03-29 MED ORDER — LIDOCAINE 5 % EX PTCH: 1.0000 | MEDICATED_PATCH | CUTANEOUS | 0 refills | Status: DC

## 2017-03-29 NOTE — Plan of Care (Signed)
Problem: [facility] Participation in Recreation Therapeutic Interventions Goal: STG-Patient will demonstrate improved self esteem by identif STG: Self-Esteem - Within 4 treatment sessions, patient will verbalize at least 5 positive affirmation statements in each of 2 treatment sessions to increase self-esteem.  Outcome: Completed/Met Date Met: 03/29/17 Treatment Session 2; Completed 2 out of 2: At approximately 8:45 am, LRT met with patient in craft room. Patient verbalized 5 positive affirmation statements. Patient reported it felt "good". LRT encouraged patient to continue saying positive affirmation statements.  Leonette Monarch, LRT/CTRS 04.09.18 2:37 pm Goal: STG-Other Recreation Therapy Goal (Specify) STG: Stress Management - Within 4 treatment sessions, patient will verbalize understanding of the stress management techniques in each of 2 treatment sessions to increase stress management skills.  Outcome: Completed/Met Date Met: 03/29/17 Treatment Session 2; Completed 2 out of 2: At approximately 8:45 am, LRT met with patient in craft room. Patient verbalized understanding of the stress management techniques. LRT encouraged patient to use the techniques.  Leonette Monarch, LRT/CTRS 04.09.18 2:38 pm
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
## 2017-03-29 NOTE — Progress Notes (Signed)
D: Pt denies SI/HI/AVH. Pt is cooperative with treatment plan. Pt  appears less anxious and she is interacting with peers and staff appropriately. Patient's thoughts are organized no bizarre behavior noted.  A: Pt was offered support and encouragement. Pt was given scheduled medications. Pt was encouraged to attend groups. Q 15 minute checks were done for safety.  R:Pt attends groups and interacts well with peers and staff. Pt is taking medication. Pt has no complaints.Pt receptive to treatment and safety maintained on unit.

## 2017-03-29 NOTE — Progress Notes (Signed)
  [facility] Adult Case Management Discharge Plan :  Will you be returning to the same living situation after discharge:  Yes,  with husband At discharge, do you have transportation home?: Yes,  husband Do you have the ability to pay for your medications: Yes,  [employer]  Release of information consent forms completed and in the chart;  Patient's signature needed at discharge.  Patient to Follow up at: Follow-up Information    [facility]. Go on 04/01/2017.   Why:  Please attend your therapy appointment with Hurley Cisco at Dr Carie Caddy office on 04/01/17 at 12:45pm.  Please bring a copy of your hospital discharge paperwork. Contact information: [address] [place], [place] [address] P: [phone] F: [phone]       Dr Milagros Evener. Go on 04/26/2017.   Why:  Please attend your medication appointment with Dr. Evelene Croon on 04/26/17, 1:45pm.  Please bring a copy of your hospital discharge paperwork. Contact information: [address] [place], [place] [address] P: [phone] F: [phone]          Next level of care provider has access to [facility] Link:no  Safety Planning and Suicide Prevention discussed: Yes,  with husband  Have you used any form of tobacco in the last 30 days? (Cigarettes, Smokeless Tobacco, Cigars, and/or Pipes): Yes  Has patient been referred to the Quitline?: Yes, faxed on 03/29/17  Patient has been referred for addiction treatment: Yes  [name], LCSW 03/29/2017, 2:39 PM
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
## 2017-03-29 NOTE — Progress Notes (Signed)
Recreation Therapy Notes  INPATIENT RECREATION TR PLAN  Patient Details Name: Katelyn Galloway MRN: [id number] DOB: Oct 25, 1961 Today's Date: 03/29/2017  Rec Therapy Plan Is patient appropriate for Therapeutic Recreation?: Yes Treatment times per week: At least once a week TR Treatment/Interventions: 1:1 session, Group participation (Comment) (Appropriate participation in daily recreational therapy tx)  Discharge Criteria Pt will be discharged from therapy if:: Treatment goals are met, Discharged Treatment plan/goals/alternatives discussed and agreed upon by:: Patient/family  Discharge Summary Short term goals set: See Care Plan Short term goals met: Complete Progress toward goals comments: One-to-one attended Which groups?: Social skills One-to-one attended: Self-esteem, stress management Reason goals not met: N/A Therapeutic equipment acquired: None Reason patient discharged from therapy: Discharge from hospital Pt/family agrees with progress & goals achieved: Yes Date patient discharged from therapy: 03/29/17   Leonette Monarch, LRT/CTRS 03/29/2017, 2:39 PM

## 2017-03-29 NOTE — Tx Team (Signed)
Interdisciplinary Treatment and Diagnostic Plan Update  03/29/2017 Time of Session: 11:15am Katelyn Galloway MRN: [id number]  Principal Diagnosis: Bipolar affective disorder, depressed (HCC)  Secondary Diagnoses: Principal Problem:   Bipolar affective disorder, depressed (HCC) Active Problems:   Cannabis abuse   Chronic bilateral low back pain   GAD (generalized anxiety disorder)   Hypothyroidism   Asthma   Tobacco use disorder   Cluster b traits (borderline and histrionoc)   Current Medications:  Current Facility-Administered Medications  Medication Dose Route Frequency Provider Last Rate Last Dose  . acetaminophen (TYLENOL) tablet 650 mg  650 mg Oral Q6H PRN Audery Amel, MD   650 mg at 03/26/17 1133  . albuterol (PROVENTIL HFA;VENTOLIN HFA) 108 (90 Base) MCG/ACT inhaler 2 puff  2 puff Inhalation Q6H PRN Jimmy Footman, MD   2 puff at 03/26/17 1710  . alum & mag hydroxide-simeth (MAALOX/MYLANTA) 200-200-20 MG/5ML suspension 30 mL  30 mL Oral Q4H PRN Audery Amel, MD      . carbamazepine (TEGRETOL) tablet 200 mg  200 mg Oral QHS Jimmy Footman, MD   200 mg at 03/28/17 2137  . carisoprodol (SOMA) tablet 175 mg  175 mg Oral BID Jimmy Footman, MD   175 mg at 03/29/17 0855  . chlorhexidine (PERIDEX) 0.12 % solution 15 mL  15 mL Mouth/Throat BID Jimmy Footman, MD   15 mL at 03/26/17 4098  . cholecalciferol (VITAMIN D) tablet 5,000 Units  5,000 Units Oral Daily Jimmy Footman, MD   5,000 Units at 03/29/17 0854  . diazepam (VALIUM) tablet 5 mg  5 mg Oral BID Jimmy Footman, MD   5 mg at 03/29/17 0855  . gabapentin (NEURONTIN) capsule 600 mg  600 mg Oral TID Jimmy Footman, MD   600 mg at 03/29/17 0854  . levothyroxine (SYNTHROID, LEVOTHROID) tablet 25 mcg  25 mcg Oral QAC breakfast Jimmy Footman, MD   25 mcg at 03/29/17 0640  . lidocaine (LIDODERM) 5 % 1 patch  1 patch Transdermal Q24H Jimmy Footman, MD   1 patch at 03/28/17 1354  . magnesium hydroxide (MILK OF MAGNESIA) suspension 30 mL  30 mL Oral Daily PRN Audery Amel, MD      . meloxicam (MOBIC) tablet 15 mg  15 mg Oral Daily Jimmy Footman, MD   15 mg at 03/29/17 0854  . morphine (MS CONTIN) 12 hr tablet 15 mg  15 mg Oral Q12H Jimmy Footman, MD   15 mg at 03/29/17 0856  . nicotine (NICODERM CQ - dosed in mg/24 hours) patch 21 mg  21 mg Transdermal Daily Jimmy Footman, MD   21 mg at 03/29/17 0857  . oxyCODONE (Oxy IR/ROXICODONE) immediate release tablet 15 mg  15 mg Oral Q4H PRN Jimmy Footman, MD   15 mg at 03/28/17 1243  . polyethylene glycol (MIRALAX / GLYCOLAX) packet 17 g  17 g Oral BID Jimmy Footman, MD   17 g at 03/29/17 0856  . prazosin (MINIPRESS) capsule 2 mg  2 mg Oral QHS Jimmy Footman, MD   2 mg at 03/28/17 2137  . venlafaxine XR (EFFEXOR-XR) 24 hr capsule 150 mg  150 mg Oral Q breakfast Audery Amel, MD   150 mg at 03/29/17 0855   PTA Medications: Prescriptions Prior to Admission  Medication Sig Dispense Refill Last Dose  . albuterol (PROVENTIL HFA;VENTOLIN HFA) 108 (90 Base) MCG/ACT inhaler INHALE TWO PUFFS BY MOUTH EVERY 6 HOURS AS NEEDED FOR WHEEZING OR FOR SHORTNESS OF BREATH   03/21/2017  at Unknown time  . carbamazepine (TEGRETOL) 200 MG tablet Take 200 mg by mouth at bedtime.    03/21/2017 at 2300  . Cholecalciferol (VITAMIN D3) 5000 units CAPS Take by mouth.   03/21/2017 at Unknown time  . Cranberry 1000 MG CAPS Take 2 capsules by mouth daily.   03/21/2017 at Unknown time  . DULoxetine (CYMBALTA) 30 MG capsule Take 30 mg by mouth 2 (two) times daily.    03/22/2017 at Unknown time  . FLUoxetine (PROZAC) 20 MG capsule Take 20 mg by mouth at bedtime.    03/21/2017 at Unknown time  . gabapentin (NEURONTIN) 600 MG tablet Take 600 mg by mouth 3 (three) times daily.    03/22/2017 at Unknown time  . hydroxypropyl methylcellulose (ISOPTO TEARS)  2.5 % ophthalmic solution Place 1 drop into both eyes at bedtime.   03/21/2017 at Unknown time  . hydrOXYzine (ATARAX/VISTARIL) 25 MG tablet Take 25 mg by mouth daily as needed for anxiety.    Past Week at Unknown time  . levothyroxine (SYNTHROID, LEVOTHROID) 25 MCG tablet Take 25 mcg by mouth daily before breakfast.    03/21/2017 at Unknown time  . meloxicam (MOBIC) 15 MG tablet Take 15 mg by mouth daily.    03/22/2017 at Unknown time  . Menthol-Methyl Salicylate (MUSCLE RUB) 10-15 % CREA Apply 1 application topically daily as needed. Joint pain   unknown  . morphine (MS CONTIN) 15 MG 12 hr tablet Take 15 mg by mouth every 12 (twelve) hours.   03/22/2017 at Unknown time  . Multiple Vitamin (MULTIVITAMIN) capsule Take 1 capsule by mouth daily.    03/22/2017 at Unknown time  . oxyCODONE (ROXICODONE) 15 MG immediate release tablet Take 15 mg by mouth every 4 (four) hours as needed for pain (for breakthrough pain).   Past Week at Unknown time  . oxyCODONE (ROXICODONE) 5 MG immediate release tablet Take 1 tablet (5 mg total) by mouth every 4 (four) hours as needed for severe pain (if needed in addition to previous prescription). (Patient not taking: Reported on 03/22/2017) 45 tablet 0 Completed Course at Unknown time  . polyvinyl alcohol-povidone (HYPOTEARS) 1.4-0.6 % ophthalmic solution Place 1 drop into both eyes daily as needed (dry eyes).    03/21/2017 at Unknown time  . Polyvinyl Alcohol-Povidone (REFRESH OP) Apply 1 drop to eye as needed (dry eyes).   03/21/2017 at Unknown time  . prazosin (MINIPRESS) 1 MG capsule Take 2 mg by mouth at bedtime.    03/21/2017 at Unknown time  . Probiotic Product (PHILLIPS COLON HEALTH PO) Take 1 capsule by mouth daily.   03/21/2017 at Unknown time  . QUEtiapine (SEROQUEL) 50 MG tablet Take 50 mg by mouth at bedtime.    03/21/2017 at Unknown time  . Wheat Dextrin (BENEFIBER PO) Take 1 packet by mouth daily. miralax   03/22/2017 at Unknown time  . [DISCONTINUED] carisoprodol (SOMA) 350 MG  tablet Take 350 mg by mouth 2 (two) times daily. spasm   03/22/2017 at Unknown time  . [DISCONTINUED] diazepam (VALIUM) 5 MG tablet Take 10 mg by mouth 2 (two) times daily.    03/22/2017 at Unknown time    Patient Stressors: Health problems Substance abuse  Patient Strengths: Average or above average intelligence Communication skills Supportive family/friends  Treatment Modalities: Medication Management, Group therapy, Case management,  1 to 1 session with clinician, Psychoeducation, Recreational therapy.   Physician Treatment Plan for Primary Diagnosis: Bipolar affective disorder, depressed (HCC) Long Term Goal(s): Improvement in symptoms so  as ready for discharge Improvement in symptoms so as ready for discharge   Short Term Goals: Ability to identify changes in lifestyle to reduce recurrence of condition will improve Ability to disclose and discuss suicidal ideas Ability to identify and develop effective coping behaviors will improve Ability to verbalize feelings will improve Ability to disclose and discuss suicidal ideas  Medication Management: Evaluate patient's response, side effects, and tolerance of medication regimen.  Therapeutic Interventions: 1 to 1 sessions, Unit Group sessions and Medication administration.  Evaluation of Outcomes: Adequate for Discharge  Physician Treatment Plan for Secondary Diagnosis: Principal Problem:   Bipolar affective disorder, depressed (HCC) Active Problems:   Cannabis abuse   Chronic bilateral low back pain   GAD (generalized anxiety disorder)   Hypothyroidism   Asthma   Tobacco use disorder   Cluster b traits (borderline and histrionoc)  Long Term Goal(s): Improvement in symptoms so as ready for discharge Improvement in symptoms so as ready for discharge   Short Term Goals: Ability to identify changes in lifestyle to reduce recurrence of condition will improve Ability to disclose and discuss suicidal ideas Ability to identify and  develop effective coping behaviors will improve Ability to verbalize feelings will improve Ability to disclose and discuss suicidal ideas     Medication Management: Evaluate patient's response, side effects, and tolerance of medication regimen.  Therapeutic Interventions: 1 to 1 sessions, Unit Group sessions and Medication administration.  Evaluation of Outcomes: Adequate for Discharge   RN Treatment Plan for Primary Diagnosis: Bipolar affective disorder, depressed (HCC) Long Term Goal(s): Knowledge of disease and therapeutic regimen to maintain health will improve  Short Term Goals: Ability to verbalize feelings will improve, Ability to disclose and discuss suicidal ideas, Ability to identify and develop effective coping behaviors will improve and Compliance with prescribed medications will improve  Medication Management: RN will administer medications as ordered by provider, will assess and evaluate patient's response and provide education to patient for prescribed medication. RN will report any adverse and/or side effects to prescribing provider.  Therapeutic Interventions: 1 on 1 counseling sessions, Psychoeducation, Medication administration, Evaluate responses to treatment, Monitor vital signs and CBGs as ordered, Perform/monitor CIWA, COWS, AIMS and Fall Risk screenings as ordered, Perform wound care treatments as ordered.  Evaluation of Outcomes: Adequate for Discharge   LCSW Treatment Plan for Primary Diagnosis: Bipolar affective disorder, depressed (HCC) Long Term Goal(s): Safe transition to appropriate next level of care at discharge, Engage patient in therapeutic group addressing interpersonal concerns.  Short Term Goals: Engage patient in aftercare planning with referrals and resources and Increase ability to appropriately verbalize feelings  Therapeutic Interventions: Assess for all discharge needs, 1 to 1 time with Social worker, Explore available resources and support  systems, Assess for adequacy in community support network, Educate family and significant other(s) on suicide prevention, Complete Psychosocial Assessment, Interpersonal group therapy.  Evaluation of Outcomes: Adequate for Discharge    Recreational Therapy Treatment Plan for Primary Diagnosis: Bipolar affective disorder, depressed (HCC) Long Term Goal(s): Patient will participate in recreation therapy treatment in at least 2 group sessions without prompting from LRT  Short Term Goals: Increase self-esteem, Increase stress management skills  Treatment Modalities: Group Therapy and Individual Treatment Sessions  Therapeutic Interventions: Psychoeducation  Evaluation of Outcomes: Adequate for Discharge   Progress in Treatment: Attending groups: Yes. Participating in groups: Yes. Taking medication as prescribed: Yes. Toleration medication: Yes. Family/Significant other contact made: Yes, husband. Patient understands diagnosis: Yes. Discussing patient identified problems/goals with staff: Yes.  Medical problems stabilized or resolved: Yes. Denies suicidal/homicidal ideation: Yes. Issues/concerns per patient self-inventory: No. Other: n/a  New problem(s) identified: None identified at this time.   New Short Term/Long Term Goal(s): Patient wants to develop new coping strategies to manage her anxiety and depression.   Discharge Plan or Barriers: Patient will discharge home and follow-up with outpatient services for medication management and outpatient therapy.   Reason for Continuation of Hospitalization: None  Estimated Length of Stay: Discharge today.  Attendees: Patient:  03/29/2017   Physician: Dr. Jimmy Footman, MD 03/29/2017   Nursing: Shelia Media, RN 03/29/2017   RN Care Manager: 03/29/2017   Social Worker: Daleen Squibb MSW, LCSW 03/29/2017   Recreational Therapist: Jacquelynn Cree, LRT/CTRS 03/29/2017   Other:  03/29/2017   Other:  03/29/2017   Other: 03/29/2017      Scribe for Treatment Team: [name], LCSW 03/29/2017 12:26 PM

## 2017-03-29 NOTE — Progress Notes (Signed)
Provided and reviewed discharge paperwork and prescriptions. Verified understanding by use of teach back method. Verbalizes understanding. Denies SI/HI/AVH. Belongings returned as noted on discharge. Pt's husband to pick her up in 45 minutes. Safety maintained with every 15 minute checks. Will continue to monitor.

## 2017-05-04 ENCOUNTER — Emergency Department (HOSPITAL_COMMUNITY)
Admission: EM | Admit: 2017-05-04 | Discharge: 2017-05-05 | Disposition: A | Discharge status: Other Acute Inpatient Hospital | Payer: Medicare Other | Attending: Physician Assistant | Admitting: Physician Assistant

## 2017-05-04 ENCOUNTER — Encounter (HOSPITAL_COMMUNITY): Payer: Self-pay

## 2017-05-04 DIAGNOSIS — J45909 Unspecified asthma, uncomplicated: Secondary | ICD-10-CM | POA: Diagnosis not present

## 2017-05-04 DIAGNOSIS — E039 Hypothyroidism, unspecified: Secondary | ICD-10-CM | POA: Insufficient documentation

## 2017-05-04 DIAGNOSIS — Z91040 Latex allergy status: Secondary | ICD-10-CM | POA: Diagnosis not present

## 2017-05-04 DIAGNOSIS — F17210 Nicotine dependence, cigarettes, uncomplicated: Secondary | ICD-10-CM | POA: Diagnosis not present

## 2017-05-04 DIAGNOSIS — R45851 Suicidal ideations: Secondary | ICD-10-CM | POA: Diagnosis present

## 2017-05-04 DIAGNOSIS — Z79899 Other long term (current) drug therapy: Secondary | ICD-10-CM | POA: Insufficient documentation

## 2017-05-04 DIAGNOSIS — F99 Mental disorder, not otherwise specified: Secondary | ICD-10-CM

## 2017-05-04 DIAGNOSIS — F329 Major depressive disorder, single episode, unspecified: Secondary | ICD-10-CM | POA: Insufficient documentation

## 2017-05-04 DIAGNOSIS — F69 Unspecified disorder of adult personality and behavior: Secondary | ICD-10-CM

## 2017-05-04 LAB — COMPREHENSIVE METABOLIC PANEL
ALBUMIN: 4.1 g/dL (ref 3.5–5.0)
ALT: 20 U/L (ref 14–54)
ANION GAP: 7 (ref 5–15)
AST: 27 U/L (ref 15–41)
Alkaline Phosphatase: 86 U/L (ref 38–126)
BUN: 9 mg/dL (ref 6–20)
CHLORIDE: 102 mmol/L (ref 101–111)
CO2: 28 mmol/L (ref 22–32)
Calcium: 9.2 mg/dL (ref 8.9–10.3)
Creatinine, Ser: 0.78 mg/dL (ref 0.44–1.00)
GFR calc Af Amer: >60 mL/min (ref >60)
GFR calc non Af Amer: >60 mL/min (ref >60)
GLUCOSE: 89 mg/dL (ref 65–99)
POTASSIUM: 3.6 mmol/L (ref 3.5–5.1)
SODIUM: 137 mmol/L (ref 135–145)
Total Bilirubin: 0.2 mg/dL — ABNORMAL LOW (ref 0.3–1.2)
Total Protein: 7 g/dL (ref 6.5–8.1)

## 2017-05-04 LAB — CBC
HEMATOCRIT: 42.1 % (ref 36.0–46.0)
Hemoglobin: 14.3 g/dL (ref 12.0–15.0)
MCH: 31 pg (ref 26.0–34.0)
MCHC: 34 g/dL (ref 30.0–36.0)
MCV: 91.3 fL (ref 78.0–100.0)
PLATELETS: 266 10*3/uL (ref 150–400)
RBC: 4.61 MIL/uL (ref 3.87–5.11)
RDW: 13.5 % (ref 11.5–15.5)
WBC: 8 10*3/uL (ref 4.0–10.5)

## 2017-05-04 LAB — RAPID URINE DRUG SCREEN, HOSP PERFORMED
AMPHETAMINES: NOT DETECTED
Barbiturates: NOT DETECTED
Benzodiazepines: POSITIVE — AB
COCAINE: NOT DETECTED
Opiates: POSITIVE — AB
Tetrahydrocannabinol: POSITIVE — AB

## 2017-05-04 LAB — SALICYLATE LEVEL: Salicylate Lvl: <7 mg/dL (ref 2.8–30.0)

## 2017-05-04 LAB — ETHANOL

## 2017-05-04 LAB — ACETAMINOPHEN LEVEL

## 2017-05-04 MED ORDER — CARBAMAZEPINE 200 MG PO TABS
200.0000 mg | ORAL_TABLET | Freq: Every day | ORAL | Status: DC
  Filled 2017-05-04: qty 1

## 2017-05-04 MED ORDER — ALBUTEROL SULFATE HFA 108 (90 BASE) MCG/ACT IN AERS: 2.0000 | INHALATION_SPRAY | RESPIRATORY_TRACT | Status: DC | PRN

## 2017-05-04 MED ORDER — VENLAFAXINE HCL ER 150 MG PO CP24
150.0000 mg | ORAL_CAPSULE | Freq: Every day | ORAL | Status: DC
  Filled 2017-05-04: qty 1

## 2017-05-04 MED ORDER — CARISOPRODOL 350 MG PO TABS
350.0000 mg | ORAL_TABLET | Freq: Two times a day (BID) | ORAL | Status: DC
  Administered 2017-05-04: 350 mg via ORAL
  Filled 2017-05-04: qty 1

## 2017-05-04 MED ORDER — MORPHINE SULFATE ER 15 MG PO TBCR
15.0000 mg | EXTENDED_RELEASE_TABLET | Freq: Two times a day (BID) | ORAL | Status: DC | PRN
  Administered 2017-05-04: 15 mg via ORAL
  Filled 2017-05-04: qty 1

## 2017-05-04 MED ORDER — DIAZEPAM 5 MG PO TABS
10.0000 mg | ORAL_TABLET | Freq: Two times a day (BID) | ORAL | Status: DC | PRN
  Administered 2017-05-04: 10 mg via ORAL
  Filled 2017-05-04: qty 2

## 2017-05-04 MED ORDER — MELOXICAM 15 MG PO TABS: 15.0000 mg | ORAL_TABLET | Freq: Every day | ORAL | Status: DC

## 2017-05-04 MED ORDER — LEVOTHYROXINE SODIUM 25 MCG PO TABS
25.0000 ug | ORAL_TABLET | Freq: Every day | ORAL | Status: DC
  Filled 2017-05-04: qty 1

## 2017-05-04 MED ORDER — GABAPENTIN 300 MG PO CAPS
600.0000 mg | ORAL_CAPSULE | Freq: Three times a day (TID) | ORAL | Status: DC
  Administered 2017-05-04: 600 mg via ORAL
  Filled 2017-05-04: qty 2

## 2017-05-04 MED ORDER — NICOTINE 21 MG/24HR TD PT24
21.0000 mg | MEDICATED_PATCH | Freq: Once | TRANSDERMAL | Status: DC
  Administered 2017-05-04: 21 mg via TRANSDERMAL
  Filled 2017-05-04: qty 1
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
## 2017-05-04 NOTE — ED Notes (Signed)
TTS in progress 

## 2017-05-04 NOTE — ED Triage Notes (Signed)
Per Pt, pt is coming from home with reports of being suicidal. Pt has HX of depression and suicidal ideation. Pt has plan to take too many medications and "go to sleep".

## 2017-05-04 NOTE — ED Provider Notes (Signed)
[facility] Provider Note   CSN: [id number] Arrival date & time: 05/04/17  1651     History   Chief Complaint Chief Complaint  Patient presents with  . Suicidal    HPI Katelyn Galloway is a 56 y.o. female.  HPI   56 year old female with extensive past medical history significant for anxiety and depression. She is reporting that she has increasing symptoms of depression and now suicidal. Patient plan is to take all her medication at once to not wake up. Patient is accompanied by husband.     Past Medical History:  Diagnosis Date  . Anemia 1970  . Anxiety   . Arthritis   . Asthma    child  . Bronchitis    "years ago"  . Bronchitis    chronic hx  . Chronic back pain    hx of  . Depression   . GERD (gastroesophageal reflux disease)    occ  . Headache(784.0)    migraines  . Heart palpitations    occ if get anxious- no tests  . History of kidney stones   . Irritable bowel syndrome (IBS)   . Seizures (HCC)    x 2 during pregnancy per pt was not diagnosed with eclampsia    Patient Active Problem List   Diagnosis Date Noted  . Cluster b traits (borderline and histrionoc) 03/26/2017  . Bipolar affective disorder, depressed (HCC) 03/24/2017  . Asthma 03/24/2017  . Tobacco use disorder 03/24/2017  . Cannabis abuse 03/23/2017  . Hypothyroidism 05/15/2015  . GAD (generalized anxiety disorder) 07/10/2010  . Chronic bilateral low back pain 07/09/2009    Past Surgical History:  Procedure Laterality Date  . ABDOMINAL HYSTERECTOMY  1992  . BACK SURGERY     x2  . CHOLECYSTECTOMY  2009 or 2010  . HARDWARE REMOVAL  12/29/2012   Procedure: HARDWARE REMOVAL;  Surgeon: Tia Alert, MD;  Location: [facility];  Service: Neurosurgery;  Laterality: N/A;  Lumbar hardware extraction  . kindey stone removal    . KNEE ARTHROSCOPY     right knee  . SPINAL CORD STIMULATOR INSERTION N/A 01/25/2015   Procedure: LUMBAR SPINAL CORD STIMULATOR INSERTION;  Surgeon: Gwynne Edinger, MD;  Location: [facility];  Service: Neurosurgery;  Laterality: N/A;  . TUBAL LIGATION      OB History    No data available       Home Medications    Prior to Admission medications   Medication Sig Start Date End Date Taking? Authorizing Provider  albuterol (PROVENTIL HFA;VENTOLIN HFA) 108 (90 Base) MCG/ACT inhaler INHALE TWO PUFFS BY MOUTH EVERY 6 HOURS AS NEEDED FOR WHEEZING OR FOR SHORTNESS OF BREATH 08/14/16   [provider]  carbamazepine (TEGRETOL) 200 MG tablet Take 200 mg by mouth at bedtime.     [provider]  carisoprodol (SOMA) 350 MG tablet Take 0.5 tablets (175 mg total) by mouth 2 (two) times daily. spasm 03/29/17   Jimmy Footman, MD  Cholecalciferol (VITAMIN D3) 5000 units CAPS Take by mouth.    [provider]  diazepam (VALIUM) 5 MG tablet Take 1 tablet (5 mg total) by mouth every 12 (twelve) hours as needed for anxiety. 03/29/17   Jimmy Footman, MD  gabapentin (NEURONTIN) 600 MG tablet Take 600 mg by mouth 3 (three) times daily.  12/22/16   [provider]  levothyroxine (SYNTHROID, LEVOTHROID) 25 MCG tablet Take 25 mcg by mouth daily before breakfast.  02/25/17   [provider]  lidocaine (LIDODERM) 5 % Place 1 patch onto the skin daily. Remove & Discard patch within 12 hours or as directed by MD 03/29/17   Jimmy Footman, MD  meloxicam (MOBIC) 15 MG tablet Take 15 mg by mouth daily.  02/04/17   [provider]  morphine (MS CONTIN) 15 MG 12 hr tablet Take 15 mg by mouth every 12 (twelve) hours.    [provider]  prazosin (MINIPRESS) 1 MG capsule Take 2 mg by mouth at bedtime.  12/24/16 12/24/17  [provider]  venlafaxine XR (EFFEXOR-XR) 150 MG 24 hr capsule Take 1 capsule (150 mg total) by mouth daily with breakfast. 03/30/17   Jimmy Footman, MD    Family History Family History  Problem Relation Age of Onset  . Anesthesia problems Neg Hx      Social History Social History  Substance Use Topics  . Smoking status: Current Some Day Smoker    Packs/day: 0.50    Years: 38.00    Types: Cigarettes  . Smokeless tobacco: Never Used     Comment: Pt reports using an electric cigarette ~ 2 times a day  . Alcohol use 0.6 oz/week    1 Glasses of wine per week     Allergies   Iodine; Latex; and Tape   Review of Systems Review of Systems  Constitutional: Negative for activity change.  Respiratory: Negative for shortness of breath.   Cardiovascular: Negative for chest pain.  Gastrointestinal: Negative for abdominal pain.     Physical Exam Updated Vital Signs BP (!) 133/92 (BP Location: Left Arm)   Pulse 78   Temp 98.2 F (36.8 C) (Oral)   Resp 18   Ht 5\' 4"  (1.626 m)   Wt 130 lb (59 kg)   SpO2 97%   BMI 22.31 kg/m   Physical Exam  Constitutional: She is oriented to person, place, and time. She appears well-developed and well-nourished.  HENT:  Head: Normocephalic and atraumatic.  Eyes: Right eye exhibits no discharge. Left eye exhibits no discharge.  Cardiovascular: Normal rate, regular rhythm and normal heart sounds.   No murmur heard. Pulmonary/Chest: Effort normal.  rhonchi  Abdominal: Soft. She exhibits no distension. There is no tenderness.  Neurological: She is oriented to person, place, and time.  Skin: Skin is warm and dry. She is not diaphoretic.  Psychiatric:  Patient with active SI.  Nursing note and vitals reviewed.    ED Treatments / Results  Labs (all labs ordered are listed, but only abnormal results are displayed) Labs Reviewed  COMPREHENSIVE METABOLIC PANEL - Abnormal; Notable for the following:       Result Value   Total Bilirubin 0.2 (*)    All other components within normal limits  ACETAMINOPHEN LEVEL - Abnormal; Notable for the following:    Acetaminophen (Tylenol), Serum <10 (*)    All other components within normal limits  ETHANOL  SALICYLATE LEVEL  CBC  RAPID URINE DRUG  SCREEN, HOSP PERFORMED    EKG  EKG Interpretation None       Radiology No results found.  Procedures Procedures (including critical care time)  Medications Ordered in ED Medications - No data to display   Initial Impression / Assessment and Plan / ED Course  I have reviewed the triage vital signs and the nursing notes.  Pertinent labs & imaging results that were available during my care of the patient were reviewed by me and considered in my medical decision making (see chart for details).  Patient is well-appearing 56 year old female presenting with suicidal ideation. Patient planned to take all her medications at once. The patient has been taking her medications as prescribed outside providers and has been multiple changes in the last month but is still struggling with depression. Will have to TTS evaluate.   Final Clinical Impressions(s) / ED Diagnoses   Final diagnoses:  None    New Prescriptions New Prescriptions   No medications on file     Abelino DerrickMackuen, Konor Noren Lyn, MD 05/04/17 2002

## 2017-05-05 ENCOUNTER — Inpatient Hospital Stay (HOSPITAL_COMMUNITY)
Admission: EM | Admit: 2017-05-05 | Discharge: 2017-05-23 | DRG: 885 | Disposition: A | Discharge status: Home / Self Care | Payer: Medicare Other | Source: Intra-hospital | Attending: Psychiatry | Admitting: Psychiatry

## 2017-05-05 ENCOUNTER — Encounter (HOSPITAL_COMMUNITY): Payer: Self-pay | Admitting: *Deleted

## 2017-05-05 DIAGNOSIS — N898 Other specified noninflammatory disorders of vagina: Secondary | ICD-10-CM | POA: Diagnosis present

## 2017-05-05 DIAGNOSIS — F3130 Bipolar disorder, current episode depressed, mild or moderate severity, unspecified: Secondary | ICD-10-CM | POA: Diagnosis present

## 2017-05-05 DIAGNOSIS — F314 Bipolar disorder, current episode depressed, severe, without psychotic features: Secondary | ICD-10-CM | POA: Diagnosis not present

## 2017-05-05 DIAGNOSIS — F603 Borderline personality disorder: Secondary | ICD-10-CM | POA: Diagnosis present

## 2017-05-05 DIAGNOSIS — G8929 Other chronic pain: Secondary | ICD-10-CM | POA: Diagnosis present

## 2017-05-05 DIAGNOSIS — F17210 Nicotine dependence, cigarettes, uncomplicated: Secondary | ICD-10-CM

## 2017-05-05 DIAGNOSIS — F411 Generalized anxiety disorder: Secondary | ICD-10-CM | POA: Diagnosis present

## 2017-05-05 DIAGNOSIS — Z818 Family history of other mental and behavioral disorders: Secondary | ICD-10-CM | POA: Diagnosis not present

## 2017-05-05 DIAGNOSIS — K5900 Constipation, unspecified: Secondary | ICD-10-CM | POA: Diagnosis present

## 2017-05-05 DIAGNOSIS — F1310 Sedative, hypnotic or anxiolytic abuse, uncomplicated: Secondary | ICD-10-CM | POA: Diagnosis present

## 2017-05-05 DIAGNOSIS — F1110 Opioid abuse, uncomplicated: Secondary | ICD-10-CM | POA: Diagnosis present

## 2017-05-05 DIAGNOSIS — E559 Vitamin D deficiency, unspecified: Secondary | ICD-10-CM | POA: Diagnosis present

## 2017-05-05 DIAGNOSIS — F17213 Nicotine dependence, cigarettes, with withdrawal: Secondary | ICD-10-CM | POA: Diagnosis present

## 2017-05-05 DIAGNOSIS — F1290 Cannabis use, unspecified, uncomplicated: Secondary | ICD-10-CM | POA: Diagnosis not present

## 2017-05-05 DIAGNOSIS — J45909 Unspecified asthma, uncomplicated: Secondary | ICD-10-CM | POA: Diagnosis present

## 2017-05-05 DIAGNOSIS — R45851 Suicidal ideations: Secondary | ICD-10-CM | POA: Diagnosis present

## 2017-05-05 DIAGNOSIS — F6089 Other specific personality disorders: Secondary | ICD-10-CM | POA: Diagnosis present

## 2017-05-05 DIAGNOSIS — F3132 Bipolar disorder, current episode depressed, moderate: Secondary | ICD-10-CM

## 2017-05-05 DIAGNOSIS — M545 Low back pain, unspecified: Secondary | ICD-10-CM | POA: Diagnosis present

## 2017-05-05 DIAGNOSIS — G4700 Insomnia, unspecified: Secondary | ICD-10-CM | POA: Diagnosis present

## 2017-05-05 DIAGNOSIS — E039 Hypothyroidism, unspecified: Secondary | ICD-10-CM | POA: Diagnosis present

## 2017-05-05 DIAGNOSIS — F4310 Post-traumatic stress disorder, unspecified: Secondary | ICD-10-CM | POA: Diagnosis present

## 2017-05-05 DIAGNOSIS — M797 Fibromyalgia: Secondary | ICD-10-CM | POA: Diagnosis present

## 2017-05-05 DIAGNOSIS — M5442 Lumbago with sciatica, left side: Secondary | ICD-10-CM

## 2017-05-05 DIAGNOSIS — K219 Gastro-esophageal reflux disease without esophagitis: Secondary | ICD-10-CM | POA: Diagnosis present

## 2017-05-05 DIAGNOSIS — M5441 Lumbago with sciatica, right side: Secondary | ICD-10-CM

## 2017-05-05 DIAGNOSIS — R1030 Lower abdominal pain, unspecified: Secondary | ICD-10-CM

## 2017-05-05 HISTORY — DX: Hypothyroidism, unspecified: E03.9

## 2017-05-05 HISTORY — DX: Fibromyalgia: M79.7

## 2017-05-05 MED ORDER — METHOCARBAMOL 500 MG PO TABS: 500.0000 mg | ORAL_TABLET | Freq: Three times a day (TID) | ORAL | Status: DC | PRN

## 2017-05-05 MED ORDER — LOPERAMIDE HCL 2 MG PO CAPS
2.0000 mg | ORAL_CAPSULE | ORAL | Status: DC | PRN
Start: 2017-05-05 — End: 2017-05-05

## 2017-05-05 MED ORDER — CLONIDINE HCL 0.1 MG PO TABS
0.1000 mg | ORAL_TABLET | Freq: Four times a day (QID) | ORAL | Status: DC
  Filled 2017-05-05 (×8): qty 1

## 2017-05-05 MED ORDER — DICYCLOMINE HCL 20 MG PO TABS: 20.0000 mg | ORAL_TABLET | Freq: Four times a day (QID) | ORAL | Status: DC | PRN

## 2017-05-05 MED ORDER — ONDANSETRON 4 MG PO TBDP: 4.0000 mg | ORAL_TABLET | Freq: Four times a day (QID) | ORAL | Status: DC | PRN

## 2017-05-05 MED ORDER — POLYETHYLENE GLYCOL 3350 17 G PO PACK
17.0000 g | PACK | Freq: Every day | ORAL | Status: DC | PRN
  Administered 2017-05-05: 17 g via ORAL
  Filled 2017-05-05 (×2): qty 1

## 2017-05-05 MED ORDER — CARBAMAZEPINE 200 MG PO TABS
200.0000 mg | ORAL_TABLET | Freq: Every day | ORAL | Status: DC
  Administered 2017-05-05 – 2017-05-22 (×19): 200 mg via ORAL
  Filled 2017-05-05: qty 7
  Filled 2017-05-05 (×3): qty 1
  Filled 2017-05-05 (×2): qty 7
  Filled 2017-05-05 (×3): qty 1
  Filled 2017-05-05 (×4): qty 7
  Filled 2017-05-05: qty 1
  Filled 2017-05-05: qty 7
  Filled 2017-05-05: qty 1
  Filled 2017-05-05 (×2): qty 7
  Filled 2017-05-05: qty 1
  Filled 2017-05-05 (×5): qty 7

## 2017-05-05 MED ORDER — ALUM & MAG HYDROXIDE-SIMETH 200-200-20 MG/5ML PO SUSP
30.0000 mL | ORAL | Status: DC | PRN
  Administered 2017-05-12: 30 mL via ORAL
  Filled 2017-05-05: qty 30

## 2017-05-05 MED ORDER — GABAPENTIN 600 MG PO TABS
600.0000 mg | ORAL_TABLET | Freq: Three times a day (TID) | ORAL | Status: DC
  Administered 2017-05-05 – 2017-05-06 (×5): 600 mg via ORAL
  Filled 2017-05-05 (×10): qty 1

## 2017-05-05 MED ORDER — CARISOPRODOL 350 MG PO TABS
350.0000 mg | ORAL_TABLET | Freq: Two times a day (BID) | ORAL | Status: DC
  Administered 2017-05-05 – 2017-05-09 (×8): 350 mg via ORAL
  Filled 2017-05-05 (×10): qty 1

## 2017-05-05 MED ORDER — MAGNESIUM HYDROXIDE 400 MG/5ML PO SUSP
30.0000 mL | Freq: Every day | ORAL | Status: DC | PRN
  Administered 2017-05-14 – 2017-05-21 (×4): 30 mL via ORAL
  Filled 2017-05-05 (×4): qty 30

## 2017-05-05 MED ORDER — VENLAFAXINE HCL ER 150 MG PO CP24
150.0000 mg | ORAL_CAPSULE | Freq: Every day | ORAL | Status: DC
  Administered 2017-05-05 – 2017-05-21 (×17): 150 mg via ORAL
  Filled 2017-05-05 (×6): qty 7
  Filled 2017-05-05: qty 1
  Filled 2017-05-05 (×2): qty 7
  Filled 2017-05-05 (×4): qty 1
  Filled 2017-05-05: qty 7
  Filled 2017-05-05 (×2): qty 1
  Filled 2017-05-05 (×2): qty 7
  Filled 2017-05-05: qty 1
  Filled 2017-05-05 (×2): qty 7
  Filled 2017-05-05: qty 1

## 2017-05-05 MED ORDER — LEVOTHYROXINE SODIUM 25 MCG PO TABS
25.0000 ug | ORAL_TABLET | Freq: Every day | ORAL | Status: DC
  Administered 2017-05-05 – 2017-05-23 (×19): 25 ug via ORAL
  Filled 2017-05-05 (×2): qty 1
  Filled 2017-05-05: qty 7
  Filled 2017-05-05 (×4): qty 1
  Filled 2017-05-05: qty 7
  Filled 2017-05-05: qty 1
  Filled 2017-05-05: qty 7
  Filled 2017-05-05 (×14): qty 1

## 2017-05-05 MED ORDER — MORPHINE SULFATE ER 15 MG PO TBCR
15.0000 mg | EXTENDED_RELEASE_TABLET | Freq: Once | ORAL | Status: AC
  Administered 2017-05-05: 15 mg via ORAL
  Filled 2017-05-05: qty 1

## 2017-05-05 MED ORDER — MORPHINE SULFATE ER 15 MG PO TBCR
15.0000 mg | EXTENDED_RELEASE_TABLET | Freq: Two times a day (BID) | ORAL | Status: DC
  Administered 2017-05-06 – 2017-05-20 (×29): 15 mg via ORAL
  Filled 2017-05-05 (×29): qty 1

## 2017-05-05 MED ORDER — DIAZEPAM 5 MG PO TABS
5.0000 mg | ORAL_TABLET | Freq: Two times a day (BID) | ORAL | Status: DC | PRN
  Administered 2017-05-05 – 2017-05-11 (×11): 5 mg via ORAL
  Filled 2017-05-05 (×12): qty 1

## 2017-05-05 MED ORDER — NICOTINE 21 MG/24HR TD PT24
21.0000 mg | MEDICATED_PATCH | Freq: Every day | TRANSDERMAL | Status: DC
  Administered 2017-05-05 – 2017-05-14 (×10): 21 mg via TRANSDERMAL
  Filled 2017-05-05 (×14): qty 1

## 2017-05-05 MED ORDER — CLONIDINE HCL 0.1 MG PO TABS: 0.1000 mg | ORAL_TABLET | Freq: Every day | ORAL | Status: DC

## 2017-05-05 MED ORDER — ALBUTEROL SULFATE HFA 108 (90 BASE) MCG/ACT IN AERS
1.0000 | INHALATION_SPRAY | Freq: Four times a day (QID) | RESPIRATORY_TRACT | Status: DC | PRN
  Filled 2017-05-05: qty 6.7

## 2017-05-05 MED ORDER — LIDOCAINE 5 % EX PTCH
1.0000 | MEDICATED_PATCH | CUTANEOUS | Status: DC
  Administered 2017-05-05 – 2017-05-21 (×17): 1 via TRANSDERMAL
  Filled 2017-05-05 (×14): qty 1
  Filled 2017-05-05: qty 7
  Filled 2017-05-05 (×8): qty 1

## 2017-05-05 MED ORDER — HYDROXYZINE HCL 25 MG PO TABS: 25.0000 mg | ORAL_TABLET | Freq: Four times a day (QID) | ORAL | Status: DC | PRN

## 2017-05-05 MED ORDER — PRAZOSIN HCL 2 MG PO CAPS
2.0000 mg | ORAL_CAPSULE | Freq: Every day | ORAL | Status: DC
  Administered 2017-05-05 – 2017-05-22 (×18): 2 mg via ORAL
  Filled 2017-05-05: qty 1
  Filled 2017-05-05: qty 7
  Filled 2017-05-05: qty 2
  Filled 2017-05-05 (×5): qty 7
  Filled 2017-05-05: qty 1
  Filled 2017-05-05: qty 7
  Filled 2017-05-05: qty 1
  Filled 2017-05-05 (×6): qty 7
  Filled 2017-05-05 (×2): qty 1
  Filled 2017-05-05 (×2): qty 7

## 2017-05-05 MED ORDER — ACETAMINOPHEN 325 MG PO TABS
650.0000 mg | ORAL_TABLET | Freq: Four times a day (QID) | ORAL | Status: DC | PRN
  Administered 2017-05-06 – 2017-05-08 (×2): 650 mg via ORAL
  Filled 2017-05-05 (×3): qty 2

## 2017-05-05 MED ORDER — CARISOPRODOL 350 MG PO TABS
175.0000 mg | ORAL_TABLET | Freq: Two times a day (BID) | ORAL | Status: DC
  Administered 2017-05-05: 175 mg via ORAL
  Filled 2017-05-05: qty 1

## 2017-05-05 MED ORDER — CARBAMAZEPINE 200 MG PO TABS: 200.0000 mg | ORAL_TABLET | Freq: Every day | ORAL | Status: DC

## 2017-05-05 MED ORDER — CLONIDINE HCL 0.1 MG PO TABS
0.1000 mg | ORAL_TABLET | ORAL | Status: DC
  Filled 2017-05-05: qty 1

## 2017-05-05 MED ORDER — MELOXICAM 15 MG PO TABS
15.0000 mg | ORAL_TABLET | Freq: Every day | ORAL | Status: DC
  Administered 2017-05-05: 15 mg via ORAL
  Filled 2017-05-05 (×3): qty 1
  Filled 2017-05-05: qty 2

## 2017-05-05 MED ORDER — TRAZODONE HCL 50 MG PO TABS
50.0000 mg | ORAL_TABLET | Freq: Every evening | ORAL | Status: DC | PRN
  Administered 2017-05-05 – 2017-05-09 (×5): 50 mg via ORAL
  Filled 2017-05-05 (×2): qty 14
  Filled 2017-05-05 (×5): qty 1
  Filled 2017-05-05: qty 14
  Filled 2017-05-05: qty 1
  Filled 2017-05-05: qty 14
  Filled 2017-05-05: qty 1
  Filled 2017-05-05 (×3): qty 14
  Filled 2017-05-05: qty 1
  Filled 2017-05-05: qty 14

## 2017-05-05 MED ORDER — MORPHINE SULFATE 15 MG PO TABS: 15.0000 mg | ORAL_TABLET | Freq: Two times a day (BID) | ORAL | Status: DC | PRN

## 2017-05-05 MED ORDER — NAPROXEN 500 MG PO TABS: 500.0000 mg | ORAL_TABLET | Freq: Two times a day (BID) | ORAL | Status: DC | PRN
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
## 2017-05-05 NOTE — Progress Notes (Signed)
D: Pt presents with flat affect and depressed mood. Pt appeared sad and noted to be tearful during shift assessment. Pt rates depression 10/10. Anxiety 10/10. Pt endorses suicidal thoughts with a plan to overdose on meds. Pt verbally contracts for safety. Pt refused to take Clonidine. Pt stated that she needs to be started on Morphine for pain and not a detox protocol. Dr. Elna BreslowEappen made aware. Pt then requested to have Valium for anxiety. Writer offered pt Vistaril for anxiety. Pt refused. Pt requested to have a medical bed due to chronic lower back pain. Medical bed provided and lidocaine patch placed to pt lower back for pain relief.  A: Medications reviewed with pt. Medications administered as ordered per MD. Verbal support provided. Pt encouraged to attend groups. 15 minute checks performed for safety. R: Pt compliant with treatment.
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
## 2017-05-05 NOTE — Tx Team (Signed)
Initial Treatment Plan 05/05/2017 3:03 AM Katelyn CosierKimberly G Galloway UDJ:[id number]    PATIENT STRESSORS: Financial difficulties Health problems Marital or family conflict   PATIENT STRENGTHS: Average or above average intelligence Capable of independent living General fund of knowledge Motivation for treatment/growth   PATIENT IDENTIFIED PROBLEMS: Depression  Risk for self harm  Chronic pain    "I want to learn how to be happy again and not feel so lonely and abandoned"  "I need to have contact with the outside world; I feel so closed in all the time"           DISCHARGE CRITERIA:  Ability to meet basic life and health needs Improved stabilization in mood, thinking, and/or behavior Medical problems require only outpatient monitoring Motivation to continue treatment in a less acute level of care Need for constant or close observation no longer present Verbal commitment to aftercare and medication compliance  PRELIMINARY DISCHARGE PLAN: Attend aftercare/continuing care group Outpatient therapy Return to previous living arrangement  PATIENT/FAMILY INVOLVEMENT: This treatment plan has been presented to and reviewed with the patient, Katelyn Galloway, and/or family memberD.  The patient and family have been given the opportunity to ask questions and make suggestions.  Charlott HollerSpeagle, Mosie Angus Church, RN 05/05/2017, 3:03 AM

## 2017-05-05 NOTE — [facility] Suicide Risk Assessment (Addendum)
[facility]   Nursing information obtained from:  Patient, Review of record Demographic factors:  Divorced or widowed, Caucasian, Low socioeconomic status, Unemployed Current Mental Status:  Self-harm thoughts Loss Factors:  Decline in physical health, Financial problems / change in socioeconomic status Historical Factors:  Prior suicide attempts, Family history of mental illness or substance abuse Risk Reduction Factors:  Sense of responsibility to family, Living with another person, especially a relative  Total Time spent with patient: 30 minutes Principal Problem: Bipolar disorder with current episode depressed (HCC) Diagnosis:   Patient Active Problem List   Diagnosis Date Noted  . Bipolar disorder with current episode depressed (HCC) [F31.30] 05/05/2017  . 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 Asthma [J45.909] 03/24/2017  . Tobacco use disorder [F17.200] 03/24/2017  . Cannabis abuse [F12.10] 03/23/2017  . Hypothyroidism [E03.9] 05/15/2015  . GAD (generalized anxiety disorder) [F41.1] 07/10/2010  . Chronic bilateral low back pain [M54.5, G89.29] 07/09/2009   Subjective Data: Pt with depression, anxiety , SI , chronic pain that is debilitating , on multiple medications as well asTENS , ruminates , preoccupied with her pain.passive SI now, hx of suicide ideation in the past with plan, family hx of suicide - father committed suicide last October 2017.   Continued Clinical Symptoms:  Alcohol Use Disorder Identification Test Final Score (AUDIT): 3 The "Alcohol Use Disorders Identification Test", Guidelines for Use in Primary Care, Second Edition.  World Science writerHealth Organization [facility](WHO). Score between 0-7:  no or low risk or alcohol related problems. Score between 8-15:  moderate risk of alcohol related problems. Score between 16-19:  high risk of alcohol related problems. Score 20 or above:  warrants further diagnostic evaluation for  alcohol dependence and treatment.   CLINICAL FACTORS:   Severe Anxiety and/or Agitation Bipolar Disorder:   Depressive phase Chronic Pain More than one psychiatric diagnosis Medical Diagnoses and Treatments/Surgeries   Musculoskeletal: Strength & Muscle Tone: within normal limits Gait & Station: seen in bed Patient leans: N/A  Psychiatric Specialty Exam: Physical Exam  Review of Systems  Musculoskeletal: Positive for back pain.  Psychiatric/Behavioral: Positive for depression and suicidal ideas. The patient is nervous/anxious and has insomnia.   All other systems reviewed and are negative.   Blood pressure 139/90, pulse 68, temperature 98.5 F (36.9 C), temperature source Oral, resp. rate 16, height 5\' 4"  (1.626 m), weight 62.6 kg (138 lb).Body mass index is 23.69 kg/m.  General Appearance: Casual  Eye Contact:  Fair  Speech:  Normal Rate  Volume:  Normal  Mood:  Anxious, Depressed and Dysphoric  Affect:  Appropriate  Thought Process:  Goal Directed and Descriptions of Associations: Intact  Orientation:  Other:  self, situation  Thought Content:  Rumination  Suicidal Thoughts:  Yes.  without intent/plan  Homicidal Thoughts:  No  Memory:  Immediate;   Fair Recent;   Fair Remote;   Fair  Judgement:  Fair  Insight:  Shallow  Psychomotor Activity:  Decreased  Concentration:  Concentration: Fair and Attention Span: Fair  Recall:  FiservFair  Fund of Knowledge:  Fair  Language:  Fair  Akathisia:  No  Handed:  Right  AIMS (if indicated):     Assets:  Communication Skills Desire for Improvement  ADL's:  Intact  Cognition:  WNL  Sleep:  Number of Hours: 3 (late admission)      COGNITIVE FEATURES THAT CONTRIBUTE TO RISK:  Closed-mindedness, Polarized thinking and Thought constriction (tunnel vision)    SUICIDE  RISK:   Severe:  Frequent, intense, and enduring suicidal ideation, specific plan, no subjective intent, but some objective markers of intent (i.e., choice of  lethal method), the method is accessible, some limited preparatory behavior, evidence of impaired self-control, severe dysphoria/symptomatology, multiple risk factors present, and few if any protective factors, particularly a lack of social support.  PLAN OF CARE: case discussed with Aggie .N NP , please see H&P. Reviewed [place] controlled substance database- she is on valium and morphine, soma  I certify that inpatient services furnished can reasonably be expected to improve the patient's condition.   Mikeila Burgen, MD 05/05/2017, 3:27 PM

## 2017-05-05 NOTE — [facility] Group Notes (Signed)
[facility] LCSW Group Therapy 05/05/2017 1:15 PM  Type of Therapy: Group Therapy- Emotion Regulation  Pt did not attend, declined invitation.   Vernie ShanksLauren Edith Lord, LCSW 05/05/2017 3:50 PM

## 2017-05-05 NOTE — Progress Notes (Signed)
Recreation Therapy Notes  Date: 05/05/17 Time: 0930 Location: [address] Dayroom  Group Topic: Stress Management  Goal Area(s) Addresses:  Patient will verbalize importance of using healthy stress management.  Patient will identify positive emotions associated with healthy stress management.   Behavioral Response: Engaged  Intervention: Stress Management  Activity :  Body Scan Meditation.  LRT introduced the stress management technique of meditation.  LRT played a meditation from the Calm app to allow patients the opportunity to scan and acknowledge the sensations and tension within the body.  Patients were to follow along as the meditation played to engage in the activity.  Education:  Stress Management, Discharge Planning.   Education Outcome: Acknowledges edcuation/In group clarification offered/Needs additional education  Clinical Observations/Feedback: Pt attended group.   Caroll RancherMarjette Jazmen Lindenbaum, LRT/CTRS         Caroll RancherLindsay, Torryn Fiske A 05/05/2017 12:47 PM
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
## 2017-05-05 NOTE — Tx Team (Signed)
Interdisciplinary Treatment and Diagnostic Plan Update  05/05/2017 Time of Session: 9:30am Katelyn Galloway MRN: [id number]  Principal Diagnosis: Bipolar disorder with current episode depressed ([place])  Secondary Diagnoses: Principal Problem:   Bipolar disorder with current episode depressed ([place]) Active Problems:   Chronic bilateral low back pain   GAD (generalized anxiety disorder)   Current Medications:  Current Facility-Administered Medications  Medication Dose Route Frequency Provider Last Rate Last Dose  . acetaminophen (TYLENOL) tablet 650 mg  650 mg Oral Q6H PRN Patriciaann Clan E, PA-C      . albuterol (PROVENTIL HFA;VENTOLIN HFA) 108 (90 Base) MCG/ACT inhaler 1-2 puff  1-2 puff Inhalation Q6H PRN Laverle Hobby, PA-C      . alum & mag hydroxide-simeth (MAALOX/MYLANTA) 200-200-20 MG/5ML suspension 30 mL  30 mL Oral Q4H PRN Laverle Hobby, PA-C      . carbamazepine (TEGRETOL) tablet 200 mg  200 mg Oral QHS Patriciaann Clan E, PA-C   200 mg at 05/05/17 2308  . carisoprodol (SOMA) tablet 350 mg  350 mg Oral BID Lindell Spar I, NP   350 mg at 05/06/17 0759  . diazepam (VALIUM) tablet 5 mg  5 mg Oral BID PRN Ursula Alert, MD   5 mg at 05/05/17 1641  . gabapentin (NEURONTIN) tablet 600 mg  600 mg Oral TID Patriciaann Clan E, PA-C   600 mg at 05/06/17 0800  . levothyroxine (SYNTHROID, LEVOTHROID) tablet 25 mcg  25 mcg Oral QAC breakfast Laverle Hobby, PA-C   25 mcg at 05/06/17 1423  . lidocaine (LIDODERM) 5 % 1 patch  1 patch Transdermal Q24H Laverle Hobby, PA-C   1 patch at 05/06/17 0800  . magnesium hydroxide (MILK OF MAGNESIA) suspension 30 mL  30 mL Oral Daily PRN Patriciaann Clan E, PA-C      . morphine (MS CONTIN) 12 hr tablet 15 mg  15 mg Oral Q12H Nwoko, Agnes I, NP   15 mg at 05/06/17 0800  . morphine (MSIR) tablet 15 mg  15 mg Oral Q12H PRN Nwoko, Agnes I, NP      . nicotine (NICODERM CQ - dosed in mg/24 hours) patch 21 mg  21 mg Transdermal Daily Patriciaann Clan E, PA-C    21 mg at 05/06/17 0800  . polyethylene glycol (MIRALAX / GLYCOLAX) packet 17 g  17 g Oral Daily PRN Laverle Hobby, PA-C   17 g at 05/05/17 2135  . prazosin (MINIPRESS) capsule 2 mg  2 mg Oral QHS Patriciaann Clan E, PA-C   2 mg at 05/05/17 2308  . traZODone (DESYREL) tablet 50 mg  50 mg Oral QHS,MR X 1 Laverle Hobby, PA-C   50 mg at 05/05/17 2308  . venlafaxine XR (EFFEXOR-XR) 24 hr capsule 150 mg  150 mg Oral Q breakfast Laverle Hobby, PA-C   150 mg at 05/06/17 0800    PTA Medications: Prescriptions Prior to Admission  Medication Sig Dispense Refill Last Dose  . albuterol (PROVENTIL HFA;VENTOLIN HFA) 108 (90 Base) MCG/ACT inhaler INHALE TWO PUFFS BY MOUTH EVERY 6 HOURS AS NEEDED FOR WHEEZING OR FOR SHORTNESS OF BREATH   Past Month at Unknown time  . carbamazepine (TEGRETOL) 200 MG tablet Take 200 mg by mouth at bedtime.    Past Week at Unknown time  . carisoprodol (SOMA) 350 MG tablet Take 0.5 tablets (175 mg total) by mouth 2 (two) times daily. spasm (Patient taking differently: Take 350 mg by mouth 2 (two) times daily. spasm) 1 tablet  0 05/04/2017 at Unknown time  . Cholecalciferol (VITAMIN D3) 5000 units CAPS Take by mouth.   05/04/2017 at Unknown time  . diazepam (VALIUM) 5 MG tablet Take 1 tablet (5 mg total) by mouth every 12 (twelve) hours as needed for anxiety. (Patient taking differently: Take 10 mg by mouth every 12 (twelve) hours as needed for anxiety. ) 1 tablet 0 05/04/2017 at Unknown time  . gabapentin (NEURONTIN) 600 MG tablet Take 600 mg by mouth 3 (three) times daily.    05/04/2017 at Unknown time  . levothyroxine (SYNTHROID, LEVOTHROID) 25 MCG tablet Take 25 mcg by mouth daily before breakfast.    05/04/2017 at Unknown time  . lidocaine (LIDODERM) 5 % Place 1 patch onto the skin daily. Remove & Discard patch within 12 hours or as directed by MD 30 patch 0 Past Week at Unknown time  . lidocaine (LIDODERM) 5 % Place onto the skin.     . meloxicam (MOBIC) 15 MG tablet Take 15 mg  by mouth daily.    05/04/2017 at Unknown time  . Morphine Sulfate ER 15 MG TBEA Take 15 mg by mouth 2 (two) times daily as needed. Pain   05/04/2017 at Unknown time  . prazosin (MINIPRESS) 1 MG capsule Take 2 mg by mouth at bedtime.    Past Week at Unknown time  . prazosin (MINIPRESS) 1 MG capsule Take by mouth.     . venlafaxine XR (EFFEXOR-XR) 150 MG 24 hr capsule Take 1 capsule (150 mg total) by mouth daily with breakfast. 30 capsule 0 05/04/2017 at Unknown time  . venlafaxine XR (EFFEXOR-XR) 150 MG 24 hr capsule Take by mouth.     . Polyvinyl Alcohol-Povidone PF 1.4-0.6 % SOLN Apply to eye.       Treatment Modalities: Medication Management, Group therapy, Case management,  1 to 1 session with clinician, Psychoeducation, Recreational therapy.  Patient Stressors: Financial difficulties Health problems Marital or family conflict  Patient Strengths: Average or above average intelligence Capable of independent living General fund of knowledge Motivation for treatment/growth  Physician Treatment Plan for Primary Diagnosis: Bipolar disorder with current episode depressed ([place]) Long Term Goal(s): Improvement in symptoms so as ready for discharge  Short Term Goals: Ability to identify changes in lifestyle to reduce recurrence of condition will improve Ability to verbalize feelings will improve Ability to disclose and discuss suicidal ideas Ability to identify and develop effective coping behaviors will improve Compliance with prescribed medications will improve Ability to identify triggers associated with substance abuse/mental health issues will improve  Medication Management: Evaluate patient's response, side effects, and tolerance of medication regimen.  Therapeutic Interventions: 1 to 1 sessions, Unit Group sessions and Medication administration.  Evaluation of Outcomes: Not Met  Physician Treatment Plan for Secondary Diagnosis: Principal Problem:   Bipolar disorder with current  episode depressed ([place]) Active Problems:   Chronic bilateral low back pain   GAD (generalized anxiety disorder)   Long Term Goal(s): Improvement in symptoms so as ready for discharge  Short Term Goals: Ability to identify changes in lifestyle to reduce recurrence of condition will improve Ability to verbalize feelings will improve Ability to disclose and discuss suicidal ideas Ability to identify and develop effective coping behaviors will improve Compliance with prescribed medications will improve Ability to identify triggers associated with substance abuse/mental health issues will improve  Medication Management: Evaluate patient's response, side effects, and tolerance of medication regimen.  Therapeutic Interventions: 1 to 1 sessions, Unit Group sessions and Medication administration.  Evaluation  of Outcomes: Not Met   RN Treatment Plan for Primary Diagnosis: Bipolar disorder with current episode depressed ([place]) Long Term Goal(s): Knowledge of disease and therapeutic regimen to maintain health will improve  Short Term Goals: Ability to verbalize feelings will improve, Ability to disclose and discuss suicidal ideas and Ability to identify and develop effective coping behaviors will improve  Medication Management: RN will administer medications as ordered by provider, will assess and evaluate patient's response and provide education to patient for prescribed medication. RN will report any adverse and/or side effects to prescribing provider.  Therapeutic Interventions: 1 on 1 counseling sessions, Psychoeducation, Medication administration, Evaluate responses to treatment, Monitor vital signs and CBGs as ordered, Perform/monitor CIWA, COWS, AIMS and Fall Risk screenings as ordered, Perform wound care treatments as ordered.  Evaluation of Outcomes: Not Met   LCSW Treatment Plan for Primary Diagnosis: Bipolar disorder with current episode depressed ([place]) Long Term Goal(s): Safe  transition to appropriate next level of care at discharge, Engage patient in therapeutic group addressing interpersonal concerns.  Short Term Goals: Engage patient in aftercare planning with referrals and resources, Identify triggers associated with mental health/substance abuse issues and Increase skills for wellness and recovery  Therapeutic Interventions: Assess for all discharge needs, 1 to 1 time with Social worker, Explore available resources and support systems, Assess for adequacy in community support network, Educate family and significant other(s) on suicide prevention, Complete Psychosocial Assessment, Interpersonal group therapy.  Evaluation of Outcomes: Not Met   Progress in Treatment: Attending groups: Pt is new to milieu, continuing to assess  Participating in groups: Pt is new to milieu, continuing to assess  Taking medication as prescribed: Yes, MD continues to assess for medication changes as needed Toleration medication: Yes, no side effects reported at this time Family/Significant other contact made: No, CSW attempting to make contact with neighbor Patient understands diagnosis: Continuing to assess Discussing patient identified problems/goals with staff: Yes Medical problems stabilized or resolved: Yes Denies suicidal/homicidal ideation: No, recently admitted with SI Issues/concerns per patient self-inventory: None Other: N/A  New problem(s) identified: None identified at this time.   New Short Term/Long Term Goal(s): None identified at this time.   Discharge Plan or Barriers: Pt will return home and follow-up with outpatient services with [facility]  Reason for Continuation of Hospitalization: Anxiety Depression Medication stabilization Suicidal ideation  Estimated Length of Stay: 3-5 days  Attendees: Patient: 05/05/2017  8:41 AM  Physician: Dr. Shea Evans 05/05/2017  8:41 AM  Nursing: Birdie Hopes, RN 05/05/2017  8:41 AM  RN Care Manager:  Lars Pinks, RN 05/05/2017  8:41 AM  Social Worker: Adriana Reams, LCSW; Matthew Saras, [place] 05/05/2017  8:41 AM  Recreational Therapist:  05/05/2017  8:41 AM  Other: Lindell Spar, NP 05/05/2017  8:41 AM  Other:  05/05/2017  8:41 AM  Other: 05/05/2017  8:41 AM    Scribe for Treatment Team: Gladstone Lighter, LCSW 05/05/2017 8:41 AM

## 2017-05-06 DIAGNOSIS — F17203 Nicotine dependence unspecified, with withdrawal: Secondary | ICD-10-CM

## 2017-05-06 DIAGNOSIS — G4700 Insomnia, unspecified: Secondary | ICD-10-CM

## 2017-05-06 DIAGNOSIS — F1290 Cannabis use, unspecified, uncomplicated: Secondary | ICD-10-CM

## 2017-05-06 DIAGNOSIS — F419 Anxiety disorder, unspecified: Secondary | ICD-10-CM

## 2017-05-06 DIAGNOSIS — F4310 Post-traumatic stress disorder, unspecified: Secondary | ICD-10-CM

## 2017-05-06 LAB — T4, FREE: Free T4: 0.67 ng/dL (ref 0.61–1.12)

## 2017-05-06 MED ORDER — GABAPENTIN 400 MG PO CAPS
800.0000 mg | ORAL_CAPSULE | Freq: Three times a day (TID) | ORAL | Status: DC
  Administered 2017-05-06 – 2017-05-10 (×12): 800 mg via ORAL
  Filled 2017-05-06: qty 42
  Filled 2017-05-06: qty 2
  Filled 2017-05-06 (×4): qty 42
  Filled 2017-05-06: qty 2
  Filled 2017-05-06 (×2): qty 42
  Filled 2017-05-06: qty 2
  Filled 2017-05-06 (×3): qty 42
  Filled 2017-05-06: qty 2
  Filled 2017-05-06: qty 42
  Filled 2017-05-06: qty 2
  Filled 2017-05-06: qty 42

## 2017-05-06 NOTE — Progress Notes (Signed)
Patient ID: Katelyn Galloway, female   DOB: 12/25/1960, 56 y.o.   MRN: [id number] D: Client visible on the unit, seen with visitor, later in dayroom. Client tearful today, complains of anxiety. A: Writer provided emotional support, administered medications as ordered. Staff will monitor q4315min for safety. R: Client is safe on the unit, attended karaoke.

## 2017-05-06 NOTE — Progress Notes (Signed)
[facility] MD Progress Note  05/06/2017 3:20 PM DENVER HARDER  MRN:  [id number]  Subjective: [place] reports, "It has not been a good day for me. The stupid little stuff keep irritating me. I'm having phantom pain to my ovaries & Uterus. The suicidal thought comes & goes. I'm afraid, I'm getting more depressed".  Objective: Levenia is seen, chart reviewed. This case is discussed with the treatment team. Patient reports that today is not a good day for her. She says nothing is going well. She is complaining of what she described as phantom pain to her uterus & ovaries. She is upset because she says her Valium is not strong enough for her anxiety level. She is asking if there is any anxiety medicines stronger than Valium. She says she is irritated & angry. She presents tearful. Says her relationship with her significant other is not going well because he wants to have sex when she has no desire for it. However, watching Kennette while in the day room with other patient, she presents a different mood, carrying conversation with the other patients. She does not appear to be responding to any internal stimuli. She continues to need support & encouragement from staff. Her Gabapentin dose is increased to help with her irritability.  Principal Problem: Bipolar disorder with current episode depressed ([place])  Diagnosis:   Patient Active Problem List   Diagnosis Date Noted  . Bipolar disorder with current episode depressed ([place]) [F31.30] 05/05/2017  . 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 Asthma [J45.909] 03/24/2017  . Tobacco use disorder [F17.200] 03/24/2017  . Cannabis abuse [F12.10] 03/23/2017  . Hypothyroidism [E03.9] 05/15/2015  . GAD (generalized anxiety disorder) [F41.1] 07/10/2010  . Chronic bilateral low back pain [M54.5, G89.29] 07/09/2009   Total Time spent with patient: 25 minutes  Past Psychiatric History: Bipolar disorder, depressed, Substance use  disorder.  Past Medical History:  Past Medical History:  Diagnosis Date  . Anemia 1970  . Anxiety   . Arthritis   . Asthma    child  . Bronchitis    "years ago"  . Bronchitis    chronic hx  . Chronic back pain    hx of  . Depression   . Fibromyalgia   . GERD (gastroesophageal reflux disease)    occ  . Headache(784.0)    migraines  . Heart palpitations    occ if get anxious- no tests  . History of kidney stones   . Hypothyroidism   . Irritable bowel syndrome (IBS)   . Seizures ([place])    x 2 during pregnancy per pt was not diagnosed with eclampsia    Past Surgical History:  Procedure Laterality Date  . ABDOMINAL HYSTERECTOMY  1992  . BACK SURGERY     x2  . CHOLECYSTECTOMY  2009 or 2010  . HARDWARE REMOVAL  12/29/2012   Procedure: HARDWARE REMOVAL;  Surgeon: Eustace Moore, MD;  Location: [place] [facility];  Service: Neurosurgery;  Laterality: N/A;  Lumbar hardware extraction  . kindey stone removal    . KNEE ARTHROSCOPY     right knee  . SPINAL CORD STIMULATOR INSERTION N/A 01/25/2015   Procedure: LUMBAR SPINAL CORD STIMULATOR INSERTION;  Surgeon: Bonna Gains, MD;  Location: [facility];  Service: Neurosurgery;  Laterality: N/A;  . TUBAL LIGATION     Family History:  Family History  Problem Relation Age of Onset  . Depression Mother   . Heart attack Mother   . Suicidality Father   .  Heart disease Father   . Anesthesia problems Neg Hx    Family Psychiatric  History: See H&P  Social History:  History  Alcohol Use  . 0.6 oz/week  . 1 Glasses of wine per week     History  Drug Use  . Types: Marijuana    Social History   Social History  . Marital status: Divorced    Spouse name: N/A  . Number of children: N/A  . Years of education: N/A   Social History Main Topics  . Smoking status: Current Some Day Smoker    Packs/day: 0.50    Years: 38.00    Types: Cigarettes  . Smokeless tobacco: Never Used     Comment: Pt reports using an electric cigarette ~ 2  times a day  . Alcohol use 0.6 oz/week    1 Glasses of wine per week  . Drug use: Yes    Types: Marijuana  . Sexual activity: Not Currently    Birth control/ protection: Surgical   Other Topics Concern  . None   Social History Narrative  . None   Additional Social History:    Pain Medications: See home med list Prescriptions: See home med list Over the Counter: See home med list History of alcohol / drug use?: No history of alcohol / drug abuse Name of Substance 1: THC 1 - Age of First Use: 14 1 - Amount (size/oz): "1/2 to one joint" 1 - Frequency: daily for pain 1 - Duration: ongoing since 1999 1 - Last Use / Amount: 05/03/17  Sleep: Fair  Appetite:  Fair  Current Medications: Current Facility-Administered Medications  Medication Dose Route Frequency Provider Last Rate Last Dose  . acetaminophen (TYLENOL) tablet 650 mg  650 mg Oral Q6H PRN Laverle Hobby, PA-C   650 mg at 05/06/17 1959  . albuterol (PROVENTIL HFA;VENTOLIN HFA) 108 (90 Base) MCG/ACT inhaler 1-2 puff  1-2 puff Inhalation Q6H PRN Laverle Hobby, PA-C      . alum & mag hydroxide-simeth (MAALOX/MYLANTA) 200-200-20 MG/5ML suspension 30 mL  30 mL Oral Q4H PRN Laverle Hobby, PA-C      . carbamazepine (TEGRETOL) tablet 200 mg  200 mg Oral QHS Patriciaann Clan E, PA-C   200 mg at 05/05/17 2308  . carisoprodol (SOMA) tablet 350 mg  350 mg Oral BID Lindell Spar I, NP   350 mg at 05/06/17 0759  . diazepam (VALIUM) tablet 5 mg  5 mg Oral BID PRN Ursula Alert, MD   5 mg at 05/06/17 0923  . gabapentin (NEURONTIN) tablet 600 mg  600 mg Oral TID Patriciaann Clan E, PA-C   600 mg at 05/06/17 1306  . levothyroxine (SYNTHROID, LEVOTHROID) tablet 25 mcg  25 mcg Oral QAC breakfast Laverle Hobby, PA-C   25 mcg at 05/06/17 7471  . lidocaine (LIDODERM) 5 % 1 patch  1 patch Transdermal Q24H Laverle Hobby, PA-C   1 patch at 05/06/17 0800  . magnesium hydroxide (MILK OF MAGNESIA) suspension 30 mL  30 mL Oral Daily PRN  Patriciaann Clan E, PA-C      . morphine (MS CONTIN) 12 hr tablet 15 mg  15 mg Oral Q12H Nwoko, Agnes I, NP   15 mg at 05/06/17 0800  . morphine (MSIR) tablet 15 mg  15 mg Oral Q12H PRN Nwoko, Agnes I, NP      . nicotine (NICODERM CQ - dosed in mg/24 hours) patch 21 mg  21 mg Transdermal Daily Patriciaann Clan  E, PA-C   21 mg at 05/06/17 0800  . polyethylene glycol (MIRALAX / GLYCOLAX) packet 17 g  17 g Oral Daily PRN Laverle Hobby, PA-C   17 g at 05/05/17 2135  . prazosin (MINIPRESS) capsule 2 mg  2 mg Oral QHS Patriciaann Clan E, PA-C   2 mg at 05/05/17 2308  . traZODone (DESYREL) tablet 50 mg  50 mg Oral QHS,MR X 1 Laverle Hobby, PA-C   50 mg at 05/05/17 2308  . venlafaxine XR (EFFEXOR-XR) 24 hr capsule 150 mg  150 mg Oral Q breakfast Laverle Hobby, PA-C   150 mg at 05/06/17 0800    Lab Results:  Results for orders placed or performed during the hospital encounter of 05/04/17 (from the past 48 hour(s))  Comprehensive metabolic panel     Status: Abnormal   Collection Time: 05/04/17  6:12 PM  Result Value Ref Range   Sodium 137 135 - 145 mmol/L   Potassium 3.6 3.5 - 5.1 mmol/L   Chloride 102 101 - 111 mmol/L   CO2 28 22 - 32 mmol/L   Glucose, Bld 89 65 - 99 mg/dL   BUN 9 6 - 20 mg/dL   Creatinine, Ser 0.78 0.44 - 1.00 mg/dL   Calcium 9.2 8.9 - 10.3 mg/dL   Total Protein 7.0 6.5 - 8.1 g/dL   Albumin 4.1 3.5 - 5.0 g/dL   AST 27 15 - 41 U/L   ALT 20 14 - 54 U/L   Alkaline Phosphatase 86 38 - 126 U/L   Total Bilirubin 0.2 (L) 0.3 - 1.2 mg/dL   GFR calc non Af Amer >60 >60 mL/min   GFR calc Af Amer >60 >60 mL/min    Comment: (NOTE) The eGFR has been calculated using the CKD EPI equation. This calculation has not been validated in all clinical situations. eGFR's persistently <60 mL/min signify possible Chronic Kidney Disease.    Anion gap 7 5 - 15  Ethanol     Status: None   Collection Time: 05/04/17  6:12 PM  Result Value Ref Range   Alcohol, Ethyl (B) <5 <5 mg/dL    Comment:         LOWEST DETECTABLE LIMIT FOR SERUM ALCOHOL IS 5 mg/dL FOR MEDICAL PURPOSES ONLY   Salicylate level     Status: None   Collection Time: 05/04/17  6:12 PM  Result Value Ref Range   Salicylate Lvl <4.4 2.8 - 30.0 mg/dL  Acetaminophen level     Status: Abnormal   Collection Time: 05/04/17  6:12 PM  Result Value Ref Range   Acetaminophen (Tylenol), Serum <10 (L) 10 - 30 ug/mL    Comment:        THERAPEUTIC CONCENTRATIONS VARY SIGNIFICANTLY. A RANGE OF 10-30 ug/mL MAY BE AN EFFECTIVE CONCENTRATION FOR MANY PATIENTS. HOWEVER, SOME ARE BEST TREATED AT CONCENTRATIONS OUTSIDE THIS RANGE. ACETAMINOPHEN CONCENTRATIONS >150 ug/mL AT 4 HOURS AFTER INGESTION AND >50 ug/mL AT 12 HOURS AFTER INGESTION ARE OFTEN ASSOCIATED WITH TOXIC REACTIONS.   cbc     Status: None   Collection Time: 05/04/17  6:12 PM  Result Value Ref Range   WBC 8.0 4.0 - 10.5 K/uL   RBC 4.61 3.87 - 5.11 MIL/uL   Hemoglobin 14.3 12.0 - 15.0 g/dL   HCT 42.1 36.0 - 46.0 %   MCV 91.3 78.0 - 100.0 fL   MCH 31.0 26.0 - 34.0 pg   MCHC 34.0 30.0 - 36.0 g/dL   RDW 13.5 11.5 -  15.5 %   Platelets 266 150 - 400 K/uL  Rapid urine drug screen (hospital performed)     Status: Abnormal   Collection Time: 05/04/17  7:10 PM  Result Value Ref Range   Opiates POSITIVE (A) NONE DETECTED   Cocaine NONE DETECTED NONE DETECTED   Benzodiazepines POSITIVE (A) NONE DETECTED   Amphetamines NONE DETECTED NONE DETECTED   Tetrahydrocannabinol POSITIVE (A) NONE DETECTED   Barbiturates NONE DETECTED NONE DETECTED    Comment:        DRUG SCREEN FOR MEDICAL PURPOSES ONLY.  IF CONFIRMATION IS NEEDED FOR ANY PURPOSE, NOTIFY LAB WITHIN 5 DAYS.        LOWEST DETECTABLE LIMITS FOR URINE DRUG SCREEN Drug Class       Cutoff (ng/mL) Amphetamine      1000 Barbiturate      200 Benzodiazepine   828 Tricyclics       003 Opiates          300 Cocaine          300 THC              50     Blood Alcohol level:  Lab Results  Component  Value Date   ETH <5 05/04/2017   ETH <5 49/17/9150    Metabolic Disorder Labs: Lab Results  Component Value Date   HGBA1C 5.2 03/24/2017   MPG 103 03/24/2017   No results found for: PROLACTIN Lab Results  Component Value Date   CHOL 173 03/24/2017   TRIG 139 03/24/2017   HDL 53 03/24/2017   CHOLHDL 3.3 03/24/2017   VLDL 28 03/24/2017   LDLCALC 92 03/24/2017    Physical Findings: AIMS: Facial and Oral Movements Muscles of Facial Expression: None, normal Lips and Perioral Area: None, normal Jaw: None, normal Tongue: None, normal,Extremity Movements Upper (arms, wrists, hands, fingers): None, normal Lower (legs, knees, ankles, toes): None, normal, Trunk Movements Neck, shoulders, hips: None, normal, Overall Severity Severity of abnormal movements (highest score from questions above): None, normal Incapacitation due to abnormal movements: None, normal Patient's awareness of abnormal movements (rate only patient's report): No Awareness, Dental Status Current problems with teeth and/or dentures?: Yes Does patient usually wear dentures?: No  CIWA:    COWS:  COWS Total Score: 4  Musculoskeletal: Strength & Muscle Tone: within normal limits Gait & Station: normal Patient leans: N/A  Psychiatric Specialty Exam: Physical Exam: Nurse's noted & Vital signs reviewed.  Review of Systems  Psychiatric/Behavioral: Positive for depression, substance abuse and suicidal ideas.    Blood pressure 118/78, pulse 83, temperature 97.9 F (36.6 C), temperature source Oral, resp. rate 16, height '5\' 4"'  (1.626 m), weight 62.6 kg (138 lb), SpO2 98 %.Body mass index is 23.69 kg/m.  General Appearance: Casual  Eye Contact:  Fair  Speech:  Normal Rate  Volume:  Normal  Mood:  Anxious, Depressed and Dysphoric  Affect:  Appropriate  Thought Process:  Goal Directed and Descriptions of Associations: Intact  Orientation:  Other:  self, situation  Thought Content:  Rumination  Suicidal Thoughts:   Yes.  without intent/plan  Homicidal Thoughts:  No  Memory:  Immediate;   Fair Recent;   Fair Remote;   Fair  Judgement:  Fair  Insight:  Shallow  Psychomotor Activity:  Decreased  Concentration:  Concentration: Fair and Attention Span: Fair  Recall:  [employer] of Knowledge:  Fair  Language:  Fair  Akathisia:  No  Handed:  Right  AIMS (if indicated):  Assets:  Communication Skills Desire for Improvement  ADL's:  Intact  Cognition:  WNL  Sleep:  Number of Hours: 3 (late admission)      Treatment Plan Summary: Patient is at his baseline. No evidence of psychosis. No evidence of mania. No dangerousness. We are finalizing aftercare    Psychiatric: Substance Induced Mood Disorder (SUD)  Medical: Hypothroidism. Asthma Muscle spasms/pain. Resumed all pertinent home medications as recommended by the primary care provider. Will continue to monitor for any symptoms.  Psychosocial:  Encourage verbalization of feelings. Will encourage group counseling attendance & participation.  PLAN: 1.05-06-17: No changes made on the current plan of care, continue current regimen as recommended.  Depression: Continue Effexor XR 150 mg daily.  Mood Stabilization:  Will continue Tegretol 200 mg at Q bedtime.  Anxiety/agitation: Continue Valium 5 mg bid prn. Increased Gabapentin from 600 mg to 800 mg tid for agitation.  PTSD symptoms: Continue Minipress 2 mg Q hs.  Insomnia: Continue Trazodone 50 mg prn.  Nicotine Withdrawal symptoms: Will continue the nicotine patch 21 mg.  2. Continue to monitor mood, behavior and interaction with peers  Encarnacion Slates, NP, PMHNP, FNP-BC 05/06/2017, 3:20 PM   Agree with NP Progress Note

## 2017-05-06 NOTE — Progress Notes (Signed)
Adult Psychoeducational Group Note  Date:  05/06/2017 Time:  4:48 PM  Group Topic/Focus:  Orientation:   The focus of this group is to educate the patient on the purpose and policies of crisis stabilization and provide a format to answer questions about their admission.  The group details unit policies and expectations of patients while admitted.  Participation Level:  Active  Participation Quality:  Intrusive  Affect:  Appropriate  Cognitive:  Appropriate  Insight: Lacking  Engagement in Group:  Distracting  Modes of Intervention:  Discussion, Education and Orientation  Additional Comments:    Uyen Eichholz L 05/06/2017, 4:48 PM

## 2017-05-06 NOTE — Progress Notes (Signed)
Pt reports her day was better when her home meds were restarted.  She states that she has to have her Valium and morphine to function.  She continues to be cynical in her thoughts, but is cooperative and appropriate with staff and peers.  She denies having any suicidal thoughts at this time, although those thoughts do come and go.  She says she has attended some groups today.  She makes her needs known to staff.  Support and encouragement offered.  Discharge plans are in process.  She is unsure what she is going to do about her significant other, but she does plan to return to their home which they purchased together.  Safety maintained with q15 minute checks.

## 2017-05-06 NOTE — Progress Notes (Signed)
Adult Psychoeducational Group Note  Date:  05/06/2017 Time:  2:06 AM  Group Topic/Focus:  Wrap-Up Group:   The focus of this group is to help patients review their daily goal of treatment and discuss progress on daily workbooks.  Participation Level:  Active  Participation Quality:  Appropriate, Attentive and Sharing  Affect:  Appropriate  Cognitive:  Appropriate  Insight: Appropriate and Good  Engagement in Group:  Developing/Improving  Modes of Intervention:  Discussion  Additional Comments:  Pt shared she visitors today and it went well. Pt stated she had a productive day, got medication straighten out wtith the doctor. Pt appeared to be invested in group.  Karleen HampshireFox, Barron Vanloan Brittini 05/06/2017, 2:06 AM

## 2017-05-06 NOTE — [facility] Group Notes (Signed)
[facility] LCSW Group Therapy 05/06/2017 1:15pm  Type of Therapy: Group Therapy- Balance in Life  Participation Level: Minimal  Description of the Group:  The topic for group was balance in life. Today's group focused on defining balance in one's own words, identifying things that can knock one off balance, and exploring healthy ways to maintain balance in life. Group members were asked to provide an example of a time when they felt off balance, describe how they handled that situation,and process healthier ways to regain balance in the future. Group members were asked to share the most important tool for maintaining balance that [facility] and how they plan to apply this method after discharge.  Summary of Patient Progress Pt participated minimally in group discussion and what she did contribute was primarily negative. Pt reports that she is frustrated that she cannot make her partner act in better ways.   Therapeutic Modalities:   Cognitive Behavioral Therapy Solution-Focused Therapy Assertiveness Training   Vernie ShanksLauren Asher Babilonia, LCSW 05/06/2017 6:50 PM

## 2017-05-06 NOTE — [facility] Suicide Risk Assessment (Signed)
[facility] INPATIENT:  Family/Significant Other Suicide Prevention Education  Suicide Prevention Education:  Contact Attempts: Neoma LamingDiane Upland, Pt's friend [phone], has been identified by the patient as the family member/significant other with whom the patient will be residing, and identified as the person(s) who will aid the patient in the event of a mental health crisis.  With written consent from the patient, two attempts were made to provide suicide prevention education, prior to and/or following the patient's discharge.  We were unsuccessful in providing suicide prevention education.  A suicide education pamphlet was given to the patient to share with family/significant other.  Date and time of first attempt: 05/05/17 @ 10:53am Date and time of second attempt: 05/06/17 @ 1:10pm  Verdene LennertLauren C Dasani Crear 05/06/2017, 4:33 PM

## 2017-05-06 NOTE — Plan of Care (Signed)
Problem: Medication: Goal: Compliance with prescribed medication regimen will improve Outcome: Progressing Pt compliant with taking scheduled meds today.

## 2017-05-06 NOTE — Progress Notes (Signed)
D: Pt presents with a sad affect and depressed mood. Pt rates depression 8/10. anxiety 4/10. Pt tearful on approach, somatic and med seeking. Pt requested Morphine, Valium and tylenol this morning during 8 am medication pass. Writer informed pt to wait a hour in between each med. Pt then stated, "because it suppresses your respiratory system. At home I take it all together". Pt endorses suicidal thoughts with no plan or intent. Pt verbally contract for safety. Pt intrusive with other pt's on the unit and attention seeking.   A: Medications reviewed with. Medications administered as ordered per MD. Verbal support provided. Pt encouraged to attend groups. Pt encouraged to try deep breathing techniques to help relieve anxiety. 15 minute checks performed for safety. R: Pt compliant with tx.

## 2017-05-07 LAB — T3: T3, Total: 107 ng/dL (ref 71–180)

## 2017-05-07 NOTE — Progress Notes (Signed)
Nursing Note 05/07/2017 [id number]  Data Reports sleeping good without PRN sleep med.  Rates depression 9/10, hopelessness 7/10, and anxiety 10/10. Affect flat,sad this AM, bright and animated by evening. Depressed in AM and positive in afternoon.  Denies HI, SI, AVH.  C/O interpersonal conflict with another peer which patient explained resolved by evening.  C/O chronic pain.  Action Spoke with patient 1:1, nurse offered support to patient throughout shift.  RN spoke with patient about interpersonal conflict with other patient and offered resolution.  Continues to be monitored on 15 minute checks for safety.  Response Patient was depressed and in bed this AM. Reported pain improved by afternoon.  Bright and interacting with peers in afternoon.

## 2017-05-07 NOTE — [facility] Group Notes (Signed)
[facility] LCSW Group Therapy 05/07/2017 1:15pm  Type of Therapy: Group Therapy- Feelings Around Relapse and Recovery  Participation Level: Active   Participation Quality:  Appropriate  Affect:  Appropriate  Cognitive: Alert and Oriented   Insight:  Developing   Engagement in Therapy: Developing/Improving and Engaged   Modes of Intervention: Clarification, Confrontation, Discussion, Education, Exploration, Limit-setting, Orientation, Problem-solving, Rapport Building, Dance movement psychotherapisteality Testing, Socialization and Support  Summary of Progress/Problems: The topic for today was feelings about relapse. The group discussed what relapse prevention is to them and identified triggers that they are on the path to relapse. Members also processed their feeling towards relapse and were able to relate to common experiences. Group also discussed coping skills that can be used for relapse prevention.  Pt was more engaged in group discussion and provided an encouraging addition to the serenity prayer that the group responded well to. Pt reports that taking things one day at a time is important for her recovery. She also expressed that dwelling on the past often keeps her "stuck" in her recovery.    Therapeutic Modalities:   Cognitive Behavioral Therapy Solution-Focused Therapy Assertiveness Training Relapse Prevention Therapy    Damien FusiLauren Ibrahima Holberg, LCSW [phone] 05/07/2017 3:48 PM

## 2017-05-07 NOTE — Progress Notes (Signed)
Recreation Therapy Notes  Date: 05/07/17 Time: 0930 Location: [address] Dayroom  Group Topic: Stress Management  Goal Area(s) Addresses:  Patient will verbalize importance of using healthy stress management.  Patient will identify positive emotions associated with healthy stress management.   Intervention: Stress Management  Activity :  Guided Imagery.  LRT introduced the stress management technique of guided imagery.  LRT read a script to guide patients and engage them in the technique.  Patients were to follow along as LRT read script to fully participate in the technique.  Education:  Stress Management, Discharge Planning.   Education Outcome: Acknowledges edcuation/In group clarification offered/Needs additional education  Clinical Observations/Feedback: Pt did not attend group.   Caroll RancherMarjette Marvin Grabill, LRT/CTRS         Caroll RancherLindsay, Talise Sligh A 05/07/2017 12:21 PM

## 2017-05-07 NOTE — Progress Notes (Signed)
Pt attended karaoke group and participated. 

## 2017-05-07 NOTE — Plan of Care (Signed)
Problem: Safety: Goal: Periods of time without injury will increase Outcome: Progressing Periods of time without injury will increase AEB [id number] safety checks, client redirected as needed, remains safe on the unit.

## 2017-05-07 NOTE — Progress Notes (Signed)
[facility]  05/07/2017 4:58 PM Kathreen CosierKimberly G Ponce  MRN:  [id number]  Subjective: Cala BradfordKimberly reports, "It has been a awfulday day for me. Some one irritated me pretty bad today, but, I kind of walk away from the situation".  Objective: Cala BradfordKimberly is seen, chart reviewed. This case is discussed with the treatment team. Patient reports that today is not a good day for her for the second time in a row. She says nothing is going well, because anoother patient irritated her, but she was able to work away from the situation. She is upset because she says her Valium is not strong enough for her anxiety level. She is asking if there is any anxiety medicines stronger than Valium. She says she is irritated & angry. She presents tearful. Says her relationship with her significant other is not going well because he wants to have sex when she has no desire for it. However, watching Cala BradfordKimberly while in the day room with other patient, she presents a different mood, carrying conversation with the other patients. She does not appear to be responding to any internal stimuli. She continues to need support & encouragement from staff. Her Gabapentin dose is increased to help with her irritability.  Principal Problem: Bipolar disorder with current episode depressed (HCC)  Diagnosis:   Patient Active Problem List   Diagnosis Date Noted  . Bipolar disorder with current episode depressed (HCC) [F31.30] 05/05/2017  . 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 Asthma [J45.909] 03/24/2017  . Tobacco use disorder [F17.200] 03/24/2017  . Cannabis abuse [F12.10] 03/23/2017  . Hypothyroidism [E03.9] 05/15/2015  . GAD (generalized anxiety disorder) [F41.1] 07/10/2010  . Chronic bilateral low back pain [M54.5, G89.29] 07/09/2009   Total Time spent with patient: 15 minutes  Past Psychiatric History: Bipolar disorder, depressed, Substance use disorder.  Past Medical History:  Past Medical History:   Diagnosis Date  . Anemia 1970  . Anxiety   . Arthritis   . Asthma    child  . Bronchitis    "years ago"  . Bronchitis    chronic hx  . Chronic back pain    hx of  . Depression   . Fibromyalgia   . GERD (gastroesophageal reflux disease)    occ  . Headache(784.0)    migraines  . Heart palpitations    occ if get anxious- no tests  . History of kidney stones   . Hypothyroidism   . Irritable bowel syndrome (IBS)   . Seizures (HCC)    x 2 during pregnancy per pt was not diagnosed with eclampsia    Past Surgical History:  Procedure Laterality Date  . ABDOMINAL HYSTERECTOMY  1992  . BACK SURGERY     x2  . CHOLECYSTECTOMY  2009 or 2010  . HARDWARE REMOVAL  12/29/2012   Procedure: HARDWARE REMOVAL;  Surgeon: Tia Alertavid S Jones, MD;  Location: [facility];  Service: Neurosurgery;  Laterality: N/A;  Lumbar hardware extraction  . kindey stone removal    . KNEE ARTHROSCOPY     right knee  . SPINAL CORD STIMULATOR INSERTION N/A 01/25/2015   Procedure: LUMBAR SPINAL CORD STIMULATOR INSERTION;  Surgeon: Gwynne EdingerPaul C Harkins, MD;  Location: [facility];  Service: Neurosurgery;  Laterality: N/A;  . TUBAL LIGATION     Family History:  Family History  Problem Relation Age of Onset  . Depression Mother   . Heart attack Mother   . Suicidality Father   . Heart disease Father   .  Anesthesia problems Neg Hx    Family Psychiatric  History: See H&P  Social History:  History  Alcohol Use  . 0.6 oz/week  . 1 Glasses of wine per week     History  Drug Use  . Types: Marijuana    Social History   Social History  . Marital status: Divorced    Spouse name: N/A  . Number of children: N/A  . Years of education: N/A   Social History Main Topics  . Smoking status: Current Some Day Smoker    Packs/day: 0.50    Years: 38.00    Types: Cigarettes  . Smokeless tobacco: Never Used     Comment: Pt reports using an electric cigarette ~ 2 times a day  . Alcohol use 0.6 oz/week    1 Glasses of wine  per week  . Drug use: Yes    Types: Marijuana  . Sexual activity: Not Currently    Birth control/ protection: Surgical   Other Topics Concern  . None   Social History Narrative  . None   Additional Social History:    Pain Medications: See home med list Prescriptions: See home med list Over the Counter: See home med list History of alcohol / drug use?: No history of alcohol / drug abuse Name of Substance 1: THC 1 - Age of First Use: 14 1 - Amount (size/oz): "1/2 to one joint" 1 - Frequency: daily for pain 1 - Duration: ongoing since 1999 1 - Last Use / Amount: 05/03/17  Sleep: Fair  Appetite:  Fair  Current Medications: Current Facility-Administered Medications  Medication Dose Route Frequency Provider Last Rate Last Dose  . acetaminophen (TYLENOL) tablet 650 mg  650 mg Oral Q6H PRN Kerry Hough, PA-C   650 mg at 05/06/17 1610  . albuterol (PROVENTIL HFA;VENTOLIN HFA) 108 (90 Base) MCG/ACT inhaler 1-2 puff  1-2 puff Inhalation Q6H PRN Kerry Hough, PA-C      . alum & mag hydroxide-simeth (MAALOX/MYLANTA) 200-200-20 MG/5ML suspension 30 mL  30 mL Oral Q4H PRN Kerry Hough, PA-C      . carbamazepine (TEGRETOL) tablet 200 mg  200 mg Oral QHS Donell Sievert E, PA-C   200 mg at 05/06/17 2133  . carisoprodol (SOMA) tablet 350 mg  350 mg Oral BID [name] I, NP   350 mg at 05/07/17 0827  . diazepam (VALIUM) tablet 5 mg  5 mg Oral BID PRN Jomarie Longs, MD   5 mg at 05/07/17 0827  . gabapentin (NEURONTIN) capsule 800 mg  800 mg Oral TID [name] I, NP   800 mg at 05/07/17 1257  . levothyroxine (SYNTHROID, LEVOTHROID) tablet 25 mcg  25 mcg Oral QAC breakfast Kerry Hough, PA-C   25 mcg at 05/07/17 9604  . lidocaine (LIDODERM) 5 % 1 patch  1 patch Transdermal Q24H Kerry Hough, PA-C   1 patch at 05/07/17 5409  . magnesium hydroxide (MILK OF MAGNESIA) suspension 30 mL  30 mL Oral Daily PRN Donell Sievert E, PA-C      . morphine (MS CONTIN) 12 hr tablet 15 mg   15 mg Oral Q12H Nwoko, Agnes I, NP   15 mg at 05/07/17 0827  . morphine (MSIR) tablet 15 mg  15 mg Oral Q12H PRN Nwoko, Agnes I, NP      . nicotine (NICODERM CQ - dosed in mg/24 hours) patch 21 mg  21 mg Transdermal Daily Simon, Spencer E, PA-C   21 mg  at 05/07/17 0827  . polyethylene glycol (MIRALAX / GLYCOLAX) packet 17 g  17 g Oral Daily PRN Kerry Hough, PA-C   17 g at 05/05/17 2135  . prazosin (MINIPRESS) capsule 2 mg  2 mg Oral QHS Kerry Hough, PA-C   2 mg at 05/06/17 2133  . traZODone (DESYREL) tablet 50 mg  50 mg Oral QHS,MR X 1 Kerry Hough, PA-C   50 mg at 05/06/17 2133  . venlafaxine XR (EFFEXOR-XR) 24 hr capsule 150 mg  150 mg Oral Q breakfast Kerry Hough, PA-C   150 mg at 05/07/17 8119    Lab Results:  Results for orders placed or performed during the hospital encounter of 05/05/17 (from the past 48 hour(s))  T3     Status: None   Collection Time: 05/06/17  6:26 PM  Result Value Ref Range   T3, Total 107 71 - 180 ng/dL    Comment: (NOTE) Performed At: [facility] [address] [place], [place] [id number] Mila Homer MD ZH:[id number] Performed at [facility], [address] [place], [place] [address]   T4, free     Status: None   Collection Time: 05/06/17  6:26 PM  Result Value Ref Range   Free T4 0.67 0.61 - 1.12 ng/dL    Comment: (NOTE) Biotin ingestion may interfere with free T4 tests. If the results are inconsistent with the TSH level, previous test results, or the clinical presentation, then consider biotin interference. If needed, order repeat testing after stopping biotin. Performed at [facility], [address] [place], [place] [address]    Blood Alcohol level:  Lab Results  Component Value Date   [facility] <5 05/04/2017   ETH <5 03/23/2017   Metabolic Disorder Labs: Lab Results  Component Value Date   HGBA1C 5.2 03/24/2017   MPG 103 03/24/2017   No results found for: PROLACTIN Lab Results   Component Value Date   CHOL 173 03/24/2017   TRIG 139 03/24/2017   HDL 53 03/24/2017   CHOLHDL 3.3 03/24/2017   VLDL 28 03/24/2017   LDLCALC 92 03/24/2017    Physical Findings: AIMS: Facial and Oral Movements Muscles of Facial Expression: None, normal Lips and Perioral Area: None, normal Jaw: None, normal Tongue: None, normal,Extremity Movements Upper (arms, wrists, hands, fingers): None, normal Lower (legs, knees, ankles, toes): None, normal, Trunk Movements Neck, shoulders, hips: None, normal, Overall Severity Severity of abnormal movements (highest score from questions above): None, normal Incapacitation due to abnormal movements: None, normal Patient's awareness of abnormal movements (rate only patient's report): No Awareness, Dental Status Current problems with teeth and/or dentures?: Yes Does patient usually wear dentures?: No  CIWA:    COWS:  COWS Total Score: 1  Musculoskeletal: Strength & Muscle Tone: within normal limits Gait & Station: normal Patient leans: N/A  Psychiatric Specialty Exam: Physical Exam: Nurse's noted & Vital signs reviewed.  Review of Systems  Psychiatric/Behavioral: Positive for depression, substance abuse and suicidal ideas.    Blood pressure 108/77, pulse 80, temperature 97.8 F (36.6 C), temperature source Oral, resp. rate 16, height 5\' 4"  (1.626 m), weight 62.6 kg (138 lb), SpO2 98 %.Body mass index is 23.69 kg/m.  General Appearance: Casual  Eye Contact:  Fair  Speech:  Normal Rate  Volume:  Normal  Mood:  Anxious, Depressed and Dysphoric  Affect:  Appropriate  Thought Process:  Goal Directed and Descriptions of Associations: Intact  Orientation:  Other:  self, situation  Thought Content:  Rumination  Suicidal Thoughts:  Yes.  without intent/plan  Homicidal Thoughts:  No  Memory:  Immediate;   Fair Recent;   Fair Remote;   Fair  Judgement:  Fair  Insight:  Shallow  Psychomotor Activity:  Decreased  Concentration:   Concentration: Fair and Attention Span: Fair  Recall:  [employer] of Knowledge:  Fair  Language:  Fair  Akathisia:  No  Handed:  Right  AIMS (if indicated):     Assets:  Communication Skills Desire for Improvement  ADL's:  Intact  Cognition:  WNL  Sleep:  Number of Hours: 3 (late admission)     Assessment: This is an admission assessment for this 56 year old Caucasian female with hx of PTSD, Bipolar disorder & Cluster B-personality disorder. Admitted to the [facility] adult unit with complaints of worsening symptoms of depression & suicidal ideations with plans. During this assessment, Gwendloyn reports, "My significant other dropped me of at the [facility] ED yesterday evening. I have been depressed for many years. However, my depression worsened since October 2017 after my father committed suicide. He shot himself in the head. I think that my father was at the point I'm on now with my depression. I feel like I don't see an end to this depression. Since his death, I have been in & out of psychiatric hospital.  Treatment Plan Summary: Patient is at his baseline. No evidence of psychosis. No evidence of mania. No dangerousness. We are finalizing aftercare.    Psychiatric: Substance Induced Mood Disorder (SUD)  Medical: Hypothroidism. Asthma Muscle spasms/pain. Resumed all pertinent home medications as recommended by the primary care provider. Will continue to monitor for any symptoms.  Psychosocial:  Encourage verbalization of feelings. Will encourage group counseling attendance & participation.  PLAN: 1.05-07-17: No changes made on the current plan of care, continue current regimen as recommended.  Depression: Continue Effexor XR 150 mg daily.  Mood Stabilization:  Will continue Tegretol 200 mg at Q bedtime.  Anxiety/agitation: Continue Valium 5 mg bid prn. Increased Gabapentin from 600 mg to 800 mg tid for agitation.  PTSD symptoms: Continue Minipress 2 mg Q  hs.  Insomnia: Continue Trazodone 50 mg prn.  Nicotine Withdrawal symptoms: Will continue the nicotine patch 21 mg.  2. Continue to monitor mood, behavior and interaction with peers  Sanjuana Kava, NP, PMHNP, FNP-BC 05/07/2017, 4:58 PMPatient ID: Kathreen Cosier, female   DOB: [birth year], 56 y.o.   MRN: [id number] Agree with NP Progress Note

## 2017-05-07 NOTE — Progress Notes (Signed)
The patient attended the evening A.A.meeting and was appropriate.  

## 2017-05-08 DIAGNOSIS — F17213 Nicotine dependence, cigarettes, with withdrawal: Secondary | ICD-10-CM

## 2017-05-08 DIAGNOSIS — M62838 Other muscle spasm: Secondary | ICD-10-CM

## 2017-05-08 DIAGNOSIS — R45851 Suicidal ideations: Secondary | ICD-10-CM

## 2017-05-08 DIAGNOSIS — J45909 Unspecified asthma, uncomplicated: Secondary | ICD-10-CM

## 2017-05-08 DIAGNOSIS — E039 Hypothyroidism, unspecified: Secondary | ICD-10-CM

## 2017-05-08 MED ORDER — DOCUSATE SODIUM 100 MG PO CAPS
100.0000 mg | ORAL_CAPSULE | Freq: Every day | ORAL | Status: DC
  Administered 2017-05-08 – 2017-05-23 (×16): 100 mg via ORAL
  Filled 2017-05-08 (×6): qty 1
  Filled 2017-05-08: qty 7
  Filled 2017-05-08 (×3): qty 1
  Filled 2017-05-08: qty 7
  Filled 2017-05-08 (×2): qty 1
  Filled 2017-05-08: qty 7
  Filled 2017-05-08 (×6): qty 1

## 2017-05-08 NOTE — Progress Notes (Signed)
Nursing Note 05/08/2017 [id number]  Data Reports sleeping good without PRN sleep med.  Rates depression 7/10, hopelessness 6/10, and anxiety 5/10. Affect animated.  Denies HI AVH. Reports passive SI but agrees to come to staff before acting on any harmful thoughts.  Attending groups.  Bright and jovial in milieu.  Attending groups. C/O dysuria and genital pain.  Action Spoke with patient 1:1, nurse offered support to patient throughout shift.  NP ordered UA/STD testing, collected and to be run. Continues to be monitored on 15 minute checks for safety.  Response Remains safe and appropriate on unit.

## 2017-05-08 NOTE — [facility] Group Notes (Signed)
[facility] LCSW Group Therapy Note  05/08/2017 at 10:15 AM  Type of Therapy and Topic:  Group Therapy: Avoiding Self-Sabotaging and Enabling Behaviors  Participation Level:  Active  Participation Quality:  Intrusive and Inattentive  Affect:  Excited  Cognitive:  Alert and Oriented  Insight:  Off Topic  Engagement in Therapy:  Limited and Off Topic   Therapeutic models used Cognitive Behavioral Therapy Person-Centered Therapy Motivational Interviewing  Summary of Patient Progress: The main focus of today's process group was to explain to the adolescent what "self-sabotage" means and use Motivational Interviewing to discuss what benefits, negative or positive, were involved in a self-identified self-sabotaging behavior.Patient had difficulty censoring her own sharing with the help of redirection.  We then talked about reasons the patient may want to change the behavior and their current desire to change. While patient went off on many tangents she did identify that she often does not choose the best people to spend her time with.   Carney Bernatherine C Harrill, LCSW

## 2017-05-08 NOTE — Progress Notes (Signed)
[facility]  05/08/2017 3:21 PM Katelyn CosierKimberly G Galloway  MRN:  [id number]  Subjective:  Patient present with multiple concerns today. Reports chronic head aches, pelvic pain and vaginal odor.  Objective:  Katelyn CosierKimberly G Raker is awake, alert and oriented X4. Seen resting in dayroom.  Denies suicidal or homicidal ideation during this assessemtn. Denies auditory or visual hallucination and does not appear to be responding to internal stimuli.   Patient reports she is medication compliant without mediation side effects. Reports she has always experienced headaches. Denies that her headaches are due to medications.  States her depression 8/10. Reports good appetite  and resting okay. patient reports pelvic  pain and vaginal odor that  started 1 days ago. Patient denies that she is sexually active. Support, encouragement and reassurance was provided.   Principal Problem: Bipolar disorder with current episode depressed (HCC)  Diagnosis:   Patient Active Problem List   Diagnosis Date Noted  . Bipolar disorder with current episode depressed (HCC) [F31.30] 05/05/2017  . 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 Asthma [J45.909] 03/24/2017  . Tobacco use disorder [F17.200] 03/24/2017  . Cannabis abuse [F12.10] 03/23/2017  . Hypothyroidism [E03.9] 05/15/2015  . GAD (generalized anxiety disorder) [F41.1] 07/10/2010  . Chronic bilateral low back pain [M54.5, G89.29] 07/09/2009   Total Time spent with patient: 15 minutes  Past Psychiatric History: Bipolar disorder, depressed, Substance use disorder.  Past Medical History:  Past Medical History:  Diagnosis Date  . Anemia 1970  . Anxiety   . Arthritis   . Asthma    child  . Bronchitis    "years ago"  . Bronchitis    chronic hx  . Chronic back pain    hx of  . Depression   . Fibromyalgia   . GERD (gastroesophageal reflux disease)    occ  . Headache(784.0)    migraines  . Heart palpitations    occ if get  anxious- no tests  . History of kidney stones   . Hypothyroidism   . Irritable bowel syndrome (IBS)   . Seizures (HCC)    x 2 during pregnancy per pt was not diagnosed with eclampsia    Past Surgical History:  Procedure Laterality Date  . ABDOMINAL HYSTERECTOMY  1992  . BACK SURGERY     x2  . CHOLECYSTECTOMY  2009 or 2010  . HARDWARE REMOVAL  12/29/2012   Procedure: HARDWARE REMOVAL;  Surgeon: Tia Alertavid S Jones, MD;  Location: [facility];  Service: Neurosurgery;  Laterality: N/A;  Lumbar hardware extraction  . kindey stone removal    . KNEE ARTHROSCOPY     right knee  . SPINAL CORD STIMULATOR INSERTION N/A 01/25/2015   Procedure: LUMBAR SPINAL CORD STIMULATOR INSERTION;  Surgeon: Gwynne EdingerPaul C Harkins, MD;  Location: [facility];  Service: Neurosurgery;  Laterality: N/A;  . TUBAL LIGATION     Family History:  Family History  Problem Relation Age of Onset  . Depression Mother   . Heart attack Mother   . Suicidality Father   . Heart disease Father   . Anesthesia problems Neg Hx    Family Psychiatric  History: See H&P  Social History:  History  Alcohol Use  . 0.6 oz/week  . 1 Glasses of wine per week     History  Drug Use  . Types: Marijuana    Social History   Social History  . Marital status: Divorced    Spouse name: N/A  . Number of  children: N/A  . Years of education: N/A   Social History Main Topics  . Smoking status: Current Some Day Smoker    Packs/day: 0.50    Years: 38.00    Types: Cigarettes  . Smokeless tobacco: Never Used     Comment: Pt reports using an electric cigarette ~ 2 times a day  . Alcohol use 0.6 oz/week    1 Glasses of wine per week  . Drug use: Yes    Types: Marijuana  . Sexual activity: Not Currently    Birth control/ protection: Surgical   Other Topics Concern  . None   Social History Narrative  . None   Additional Social History:    Pain Medications: See home med list Prescriptions: See home med list Over the Counter: See home  med list History of alcohol / drug use?: No history of alcohol / drug abuse Name of Substance 1: THC 1 - Age of First Use: 14 1 - Amount (size/oz): "1/2 to one joint" 1 - Frequency: daily for pain 1 - Duration: ongoing since 1999 1 - Last Use / Amount: 05/03/17  Sleep: Fair  Appetite:  Fair  Current Medications: Current Facility-Administered Medications  Medication Dose Route Frequency Provider Last Rate Last Dose  . acetaminophen (TYLENOL) tablet 650 mg  650 mg Oral Q6H PRN Kerry Hough, PA-C   650 mg at 05/08/17 1151  . albuterol (PROVENTIL HFA;VENTOLIN HFA) 108 (90 Base) MCG/ACT inhaler 1-2 puff  1-2 puff Inhalation Q6H PRN Kerry Hough, PA-C      . alum & mag hydroxide-simeth (MAALOX/MYLANTA) 200-200-20 MG/5ML suspension 30 mL  30 mL Oral Q4H PRN Kerry Hough, PA-C      . carbamazepine (TEGRETOL) tablet 200 mg  200 mg Oral QHS Donell Sievert E, PA-C   200 mg at 05/07/17 2246  . carisoprodol (SOMA) tablet 350 mg  350 mg Oral BID [name] I, NP   350 mg at 05/08/17 0744  . diazepam (VALIUM) tablet 5 mg  5 mg Oral BID PRN Jomarie Longs, MD   5 mg at 05/08/17 1151  . docusate sodium (COLACE) capsule 100 mg  100 mg Oral Daily Oneta Rack, NP   100 mg at 05/08/17 1152  . gabapentin (NEURONTIN) capsule 800 mg  800 mg Oral TID [name] I, NP   800 mg at 05/08/17 1151  . levothyroxine (SYNTHROID, LEVOTHROID) tablet 25 mcg  25 mcg Oral QAC breakfast Kerry Hough, PA-C   25 mcg at 05/08/17 0630  . lidocaine (LIDODERM) 5 % 1 patch  1 patch Transdermal Q24H Kerry Hough, PA-C   1 patch at 05/08/17 0743  . magnesium hydroxide (MILK OF MAGNESIA) suspension 30 mL  30 mL Oral Daily PRN Donell Sievert E, PA-C      . morphine (MS CONTIN) 12 hr tablet 15 mg  15 mg Oral Q12H Nwoko, Agnes I, NP   15 mg at 05/08/17 0744  . morphine (MSIR) tablet 15 mg  15 mg Oral Q12H PRN Nwoko, Agnes I, NP      . nicotine (NICODERM CQ - dosed in mg/24 hours) patch 21 mg  21 mg Transdermal  Daily Donell Sievert E, PA-C   21 mg at 05/08/17 0744  . polyethylene glycol (MIRALAX / GLYCOLAX) packet 17 g  17 g Oral Daily PRN Kerry Hough, PA-C   17 g at 05/05/17 2135  . prazosin (MINIPRESS) capsule 2 mg  2 mg Oral QHS Kerry Hough,  PA-C   2 mg at 05/07/17 2246  . traZODone (DESYREL) tablet 50 mg  50 mg Oral QHS,MR X 1 Donell Sievert E, PA-C   50 mg at 05/07/17 2246  . venlafaxine XR (EFFEXOR-XR) 24 hr capsule 150 mg  150 mg Oral Q breakfast Kerry Hough, PA-C   150 mg at 05/08/17 1610    Lab Results:  Results for orders placed or performed during the hospital encounter of 05/05/17 (from the past 48 hour(s))  T3     Status: None   Collection Time: 05/06/17  6:26 PM  Result Value Ref Range   T3, Total 107 71 - 180 ng/dL    Comment: (NOTE) Performed At: [facility] [address] [place], [place] [id number] Mila Homer MD [id number] Performed at [facility], [address], [place], [place] [address]   T4, free     Status: None   Collection Time: 05/06/17  6:26 PM  Result Value Ref Range   Free T4 0.67 0.61 - 1.12 ng/dL    Comment: (NOTE) Biotin ingestion may interfere with free T4 tests. If the results are inconsistent with the TSH level, previous test results, or the clinical presentation, then consider biotin interference. If needed, order repeat testing after stopping biotin. Performed at [facility], [address], [place], [place] [address]    Blood Alcohol level:  Lab Results  Component Value Date   [facility] <5 05/04/2017   ETH <5 03/23/2017   Metabolic Disorder Labs: Lab Results  Component Value Date   HGBA1C 5.2 03/24/2017   MPG 103 03/24/2017   No results found for: PROLACTIN Lab Results  Component Value Date   CHOL 173 03/24/2017   TRIG 139 03/24/2017   HDL 53 03/24/2017   CHOLHDL 3.3 03/24/2017   VLDL 28 03/24/2017   LDLCALC 92 03/24/2017    Physical Findings: AIMS: Facial and Oral  Movements Muscles of Facial Expression: None, normal Lips and Perioral Area: None, normal Jaw: None, normal Tongue: None, normal,Extremity Movements Upper (arms, wrists, hands, fingers): None, normal Lower (legs, knees, ankles, toes): None, normal, Trunk Movements Neck, shoulders, hips: None, normal, Overall Severity Severity of abnormal movements (highest score from questions above): None, normal Incapacitation due to abnormal movements: None, normal Patient's awareness of abnormal movements (rate only patient's report): No Awareness, Dental Status Current problems with teeth and/or dentures?: Yes Does patient usually wear dentures?: No  CIWA:    COWS:  COWS Total Score: 1  Musculoskeletal: Strength & Muscle Tone: within normal limits Gait & Station: normal Patient leans: N/A  Psychiatric Specialty Exam: Physical Exam  Nursing note and vitals reviewed. Constitutional: She is oriented to person, place, and time.  Cardiovascular: Normal rate.   Neurological: She is alert and oriented to person, place, and time.  Psychiatric: She has a normal mood and affect. Her behavior is normal.  : Nurse's noted & Vital signs reviewed.  Review of Systems  Genitourinary:       Vaginal discharge odor  Psychiatric/Behavioral: Positive for depression, substance abuse and suicidal ideas.    Blood pressure 114/86, pulse 78, temperature 97.9 F (36.6 C), temperature source Oral, resp. rate 18, height 5\' 4"  (1.626 m), weight 62.6 kg (138 lb), SpO2 98 %.Body mass index is 23.69 kg/m.  General Appearance: Casual  Eye Contact:  Fair  Speech:  Normal Rate  Volume:  Normal  Mood: Anxious and Depressed  Affect:  Appropriate  Thought Process:  Goal Directed and Descriptions  of Associations: Intact  Orientation: person, place and time  Thought Content: Rumination with chronic back pain and medications    Suicidal Thoughts:  Yes.  without intent/plan  Homicidal Thoughts:  No  Memory:  Immediate;    Fair Recent;   Fair Remote;   Fair  Judgement:  Fair  Insight:  Shallow  Psychomotor Activity:  Decreased  Concentration:  Concentration: Fair and Attention Span: Fair  Recall:  [employer] of Knowledge:  Fair  Language:  Fair  Akathisia:  No  Handed:  Right  AIMS (if indicated):     Assets:  Communication Skills Desire for Improvement  ADL's:  Intact  Cognition:  WNL  Sleep:  Number of Hours: 3 (late admission)       I agree with current treatment plan on 05/08/2017, Patient seen face-to-face for psychiatric evaluation follow-up, chart reviewed. Reviewed the information documented and agree with the treatment plan.   Treatment Plan Summary: Patient is at his baseline. No evidence of psychosis. No evidence of mania. No dangerousness. We are finalizing aftercare.    Psychiatric: Substance Induced Mood Disorder (SUD)  Medical: Hypothroidism. Asthma Muscle spasms/pain. Resumed all pertinent home medications as recommended by the primary care provider. Will continue to monitor for any symptoms.  Psychosocial:  Encourage verbalization of feelings. Will encourage group counseling attendance & participation.  Wet prep and urin- pending results -GC chlamydia, yest  and trichomoniasis  Depression: Continue Effexor XR 150 mg daily.  Mood Stabilization:  Will continue Tegretol 200 mg at Q bedtime.  Anxiety/agitation: Continue Valium 5 mg bid prn. Continue  Gabapentin 800 mg tid for agitation.  PTSD symptoms: Continue Minipress 2 mg Q hs.  Insomnia: Continue Trazodone 50 mg prn.  Nicotine Withdrawal symptoms: Will continue the nicotine patch 21 mg.  2. Continue to monitor mood, behavior and interaction with peers  Oneta Rack, NP 05/08/2017, 3:21 PM Agree with NP Progress Note

## 2017-05-09 MED ORDER — IBUPROFEN 600 MG PO TABS
600.0000 mg | ORAL_TABLET | Freq: Four times a day (QID) | ORAL | Status: DC | PRN
  Administered 2017-05-09 – 2017-05-22 (×15): 600 mg via ORAL
  Filled 2017-05-09 (×15): qty 1

## 2017-05-09 NOTE — Progress Notes (Signed)
Child/Adolescent Psychoeducational Group Note  Date:  05/09/2017 Time:  1:01 AM  Group Topic/Focus:  Wrap-Up Group:   The focus of this group is to help patients review their daily goal of treatment and discuss progress on daily workbooks.  Participation Level:  Did Not Attend  Participation Quality:  Patient did not attend  Affect:  Patient did not attend  Cognitive:  Patient did not attend  Insight:  None  Engagement in Group:  Patient did not attend  Modes of Intervention:  Patient did not attend  Additional Comments:  Patient did not attend evening group this evening. Patient attended the AA group this evening.  Katelyn FurnaceChristopher  Leda Galloway 05/09/2017, 1:01 AM

## 2017-05-09 NOTE — [facility] Group Notes (Signed)
[facility] Group Notes:  (Nursing/MHT/Case Management/Adjunct)  Date:  05/09/2017  Time:  3:51 PM  Type of Therapy:  Psychoeducational Skills  Participation Level:  None  Participation Quality:  Inattentive and Resistant  Affect:  Defensive and Depressed  Cognitive:  Alert  Insight:  Lacking  Engagement in Group:  None  Modes of Intervention:  Discussion and Education  Summary of Progress/Problems: Patient came to group, left, and returned. She sat with her hoodie pulled up over her head and appeared uninterested in group.  Marzetta BoardDopson, Debar Plate E 05/09/2017, 3:51 PM

## 2017-05-09 NOTE — Progress Notes (Signed)
[facility]  05/09/2017 9:26 AM Kathreen CosierKimberly G Torelli  MRN:  [id number]  Subjective: Patient reports " I don't feel ready to discharge and I don't have a drinking problem, I don't know why I am on this unit." Patient continues to report concerns with vaginal odor and pelvic pain."  Objective:  Kathreen CosierKimberly G Galant is awake, alert and oriented *3. Denies suicidal or homicidal ideation during this assessment. Denies auditory or visual hallucination and does not appear to be responding to internal stimuli.   Patient reports she is medication compliant without mediation side effects. Patient reports she is worried that she may have to discharge tomorrow and feels like she hasn't gotten help.   States her depression 5/10. Reports good appetite and resting better. Patient reports she worried about her health. Support, encouragement and reassurance was provided.   Principal Problem: Bipolar disorder with current episode depressed (HCC)  Diagnosis:   Patient Active Problem List   Diagnosis Date Noted  . Bipolar disorder with current episode depressed (HCC) [F31.30] 05/05/2017  . 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 Asthma [J45.909] 03/24/2017  . Tobacco use disorder [F17.200] 03/24/2017  . Cannabis abuse [F12.10] 03/23/2017  . Hypothyroidism [E03.9] 05/15/2015  . GAD (generalized anxiety disorder) [F41.1] 07/10/2010  . Chronic bilateral low back pain [M54.5, G89.29] 07/09/2009   Total Time spent with patient: 15 minutes  Past Psychiatric History: Bipolar disorder, depressed, Substance use disorder.  Past Medical History:  Past Medical History:  Diagnosis Date  . Anemia 1970  . Anxiety   . Arthritis   . Asthma    child  . Bronchitis    "years ago"  . Bronchitis    chronic hx  . Chronic back pain    hx of  . Depression   . Fibromyalgia   . GERD (gastroesophageal reflux disease)    occ  . Headache(784.0)    migraines  . Heart palpitations     occ if get anxious- no tests  . History of kidney stones   . Hypothyroidism   . Irritable bowel syndrome (IBS)   . Seizures (HCC)    x 2 during pregnancy per pt was not diagnosed with eclampsia    Past Surgical History:  Procedure Laterality Date  . ABDOMINAL HYSTERECTOMY  1992  . BACK SURGERY     x2  . CHOLECYSTECTOMY  2009 or 2010  . HARDWARE REMOVAL  12/29/2012   Procedure: HARDWARE REMOVAL;  Surgeon: Tia Alertavid S Jones, MD;  Location: [facility];  Service: Neurosurgery;  Laterality: N/A;  Lumbar hardware extraction  . kindey stone removal    . KNEE ARTHROSCOPY     right knee  . SPINAL CORD STIMULATOR INSERTION N/A 01/25/2015   Procedure: LUMBAR SPINAL CORD STIMULATOR INSERTION;  Surgeon: Gwynne EdingerPaul C Harkins, MD;  Location: [facility];  Service: Neurosurgery;  Laterality: N/A;  . TUBAL LIGATION     Family History:  Family History  Problem Relation Age of Onset  . Depression Mother   . Heart attack Mother   . Suicidality Father   . Heart disease Father   . Anesthesia problems Neg Hx    Family Psychiatric  History: See H&P  Social History:  History  Alcohol Use  . 0.6 oz/week  . 1 Glasses of wine per week     History  Drug Use  . Types: Marijuana    Social History   Social History  . Marital status: Divorced    Spouse  name: N/A  . Number of children: N/A  . Years of education: N/A   Social History Main Topics  . Smoking status: Current Some Day Smoker    Packs/day: 0.50    Years: 38.00    Types: Cigarettes  . Smokeless tobacco: Never Used     Comment: Pt reports using an electric cigarette ~ 2 times a day  . Alcohol use 0.6 oz/week    1 Glasses of wine per week  . Drug use: Yes    Types: Marijuana  . Sexual activity: Not Currently    Birth control/ protection: Surgical   Other Topics Concern  . None   Social History Narrative  . None   Additional Social History:    Pain Medications: See home med list Prescriptions: See home med list Over the Counter:  See home med list History of alcohol / drug use?: No history of alcohol / drug abuse Name of Substance 1: THC 1 - Age of First Use: 14 1 - Amount (size/oz): "1/2 to one joint" 1 - Frequency: daily for pain 1 - Duration: ongoing since 1999 1 - Last Use / Amount: 05/03/17  Sleep: Fair  Appetite:  Fair  Current Medications: Current Facility-Administered Medications  Medication Dose Route Frequency Provider Last Rate Last Dose  . acetaminophen (TYLENOL) tablet 650 mg  650 mg Oral Q6H PRN Kerry Hough, PA-C   650 mg at 05/08/17 1151  . albuterol (PROVENTIL HFA;VENTOLIN HFA) 108 (90 Base) MCG/ACT inhaler 1-2 puff  1-2 puff Inhalation Q6H PRN Kerry Hough, PA-C      . alum & mag hydroxide-simeth (MAALOX/MYLANTA) 200-200-20 MG/5ML suspension 30 mL  30 mL Oral Q4H PRN Kerry Hough, PA-C      . carbamazepine (TEGRETOL) tablet 200 mg  200 mg Oral QHS Donell Sievert E, PA-C   200 mg at 05/08/17 2202  . carisoprodol (SOMA) tablet 350 mg  350 mg Oral BID [name] I, NP   350 mg at 05/09/17 0807  . diazepam (VALIUM) tablet 5 mg  5 mg Oral BID PRN Jomarie Longs, MD   5 mg at 05/08/17 2202  . docusate sodium (COLACE) capsule 100 mg  100 mg Oral Daily Oneta Rack, NP   100 mg at 05/09/17 0807  . gabapentin (NEURONTIN) capsule 800 mg  800 mg Oral TID [name] I, NP   800 mg at 05/09/17 0807  . levothyroxine (SYNTHROID, LEVOTHROID) tablet 25 mcg  25 mcg Oral QAC breakfast Kerry Hough, PA-C   25 mcg at 05/09/17 1610  . lidocaine (LIDODERM) 5 % 1 patch  1 patch Transdermal Q24H Kerry Hough, PA-C   1 patch at 05/09/17 9604  . magnesium hydroxide (MILK OF MAGNESIA) suspension 30 mL  30 mL Oral Daily PRN Donell Sievert E, PA-C      . morphine (MS CONTIN) 12 hr tablet 15 mg  15 mg Oral Q12H [name] I, NP   15 mg at 05/09/17 0807  . morphine (MSIR) tablet 15 mg  15 mg Oral Q12H PRN [name] I, NP      . nicotine (NICODERM CQ - dosed in mg/24 hours) patch 21 mg  21 mg  Transdermal Daily Donell Sievert E, PA-C   21 mg at 05/09/17 0809  . polyethylene glycol (MIRALAX / GLYCOLAX) packet 17 g  17 g Oral Daily PRN Kerry Hough, PA-C   17 g at 05/05/17 2135  . prazosin (MINIPRESS) capsule 2 mg  2  mg Oral QHS Kerry Hough, PA-C   2 mg at 05/08/17 2202  . traZODone (DESYREL) tablet 50 mg  50 mg Oral QHS,MR X 1 Kerry Hough, PA-C   50 mg at 05/08/17 2202  . venlafaxine XR (EFFEXOR-XR) 24 hr capsule 150 mg  150 mg Oral Q breakfast Kerry Hough, PA-C   150 mg at 05/09/17 1610    Lab Results:  No results found for this or any previous visit (from the past 48 hour(s)). Blood Alcohol level:  Lab Results  Component Value Date   ETH <5 05/04/2017   ETH <5 03/23/2017   Metabolic Disorder Labs: Lab Results  Component Value Date   HGBA1C 5.2 03/24/2017   MPG 103 03/24/2017   No results found for: PROLACTIN Lab Results  Component Value Date   CHOL 173 03/24/2017   TRIG 139 03/24/2017   HDL 53 03/24/2017   CHOLHDL 3.3 03/24/2017   VLDL 28 03/24/2017   LDLCALC 92 03/24/2017    Physical Findings: AIMS: Facial and Oral Movements Muscles of Facial Expression: None, normal Lips and Perioral Area: None, normal Jaw: None, normal Tongue: None, normal,Extremity Movements Upper (arms, wrists, hands, fingers): None, normal Lower (legs, knees, ankles, toes): None, normal, Trunk Movements Neck, shoulders, hips: None, normal, Overall Severity Severity of abnormal movements (highest score from questions above): None, normal Incapacitation due to abnormal movements: None, normal Patient's awareness of abnormal movements (rate only patient's report): No Awareness, Dental Status Current problems with teeth and/or dentures?: Yes Does patient usually wear dentures?: No  CIWA:    COWS:  COWS Total Score: 0  Musculoskeletal: Strength & Muscle Tone: within normal limits Gait & Station: normal Patient leans: N/A  Psychiatric Specialty Exam: Physical Exam   Nursing note and vitals reviewed. Constitutional: She is oriented to person, place, and time.  Cardiovascular: Normal rate.   Neurological: She is alert and oriented to person, place, and time.  Psychiatric: She has a normal mood and affect. Her behavior is normal.  : Nurse's noted & Vital signs reviewed.  Review of Systems  Genitourinary: Positive for dysuria.       Vaginal discharge odor  Psychiatric/Behavioral: Positive for depression, substance abuse and suicidal ideas.    Blood pressure 112/81, pulse 73, temperature 97.6 F (36.4 C), resp. rate 16, height 5\' 4"  (1.626 m), weight 62.6 kg (138 lb), SpO2 98 %.Body mass index is 23.69 kg/m.  General Appearance: Casual and pleasant   Eye Contact:  Fair  Speech:  Normal Rate  Volume:  Normal  Mood: Anxious and Depressed  Affect:  Appropriate  Thought Process:  Goal Directed and Descriptions of Associations: Intact  Orientation: person, place and time  Thought Content: Rumination with chronic back pain and medications    Suicidal Thoughts:  Yes.  without intent/plan  Homicidal Thoughts:  No  Memory:  Immediate;   Fair Recent;   Fair Remote;   Fair  Judgement:  Fair  Insight:  Shallow  Psychomotor Activity:  Decreased  Concentration:  Concentration: Fair and Attention Span: Fair  Recall:  [employer] of Knowledge:  Fair  Language:  Fair  Akathisia:  No  Handed:  Right  AIMS (if indicated):     Assets:  Communication Skills Desire for Improvement  ADL's:  Intact  Cognition:  WNL  Sleep:  Number of Hours: 8       I agree with current treatment plan on 05/09/2017, Patient seen face-to-face for psychiatric evaluation follow-up, chart reviewed. Reviewed  the information documented and agree with the treatment plan.   Treatment Plan Summary: Patient is at his baseline. No evidence of psychosis. No evidence of mania. No dangerousness. We are finalizing aftercare.  -continue with current treatment plan listed below except where  noted on 05/09/2017  Psychiatric: Substance Induced Mood Disorder (SUD) -Depression- patient to program on [address]  Medical: Hypothroidism. Asthma Muscle spasms/pain. Resumed all pertinent home medications as recommended by the primary care provider. Will continue to monitor for any symptoms.  Psychosocial:  Encourage verbalization of feelings. Will encourage group counseling attendance & participation.  Wet prep and urin- pending results -GC chlamydia, yest  and trichomoniasis Consults with GYN/ internal medicine- pending results: patient to f/u outpatient   Depression: Continue Effexor XR 150 mg daily.  Mood Stabilization:  Will continue Tegretol 200 mg at Q bedtime.  Anxiety/agitation: Continue Valium 5 mg bid prn. Continue  Gabapentin 800 mg tid for agitation.  PTSD symptoms: Continue Minipress 2 mg Q hs.  Insomnia: Continue Trazodone 50 mg prn.  Nicotine Withdrawal symptoms: Will continue the nicotine patch 21 mg.  2. Continue to monitor mood, behavior and interaction with peers  Oneta Rack, NP 05/09/2017, 9:26 AM  Agree with NP Progress Note

## 2017-05-09 NOTE — Progress Notes (Signed)
Patient ID: Kathreen CosierKimberly G Exley, female   DOB: 06/22/1961, 56 y.o.   MRN: [id number]  DAR: Pt. Denies HI and A/V Hallucinations. Kim endorses passive SI but is able to contract for safety. Patient does report pain in her lower back that is chronic in nature and pain in her pelvis that is acute. She received scheduled and PRN medication for this pain which provided some relief. She was transferred to the 400 hall in order to better meet her plan of care and patient's needs. Support and encouragement provided to the patient. Patient has had crying spells throughout the day and reported severe anxiety and received PRN Valium which was effective. Scheduled medications administered to patient per physician's orders. Patient is seen in the milieu interacting with peers and laughing during observation. Before Clinical research associatewriter reassessed patient's pain level after administering PRN Ibuprofen, patient was seen in the dayroom swaying her hips and laughing with a peer. As patient acknowledged Clinical research associatewriter she grabbed at her right hip/pelvic area and grimaced. She rated pain 6/10 at this time. Q15 minute checks are maintained for safety.

## 2017-05-09 NOTE — [facility] Group Notes (Signed)
[facility] Group Notes:  (Clinical Social Work)   05/09/2017    10:00-11:00AM  Summary of Progress/Problems:   The main focus of today's process group was to   1)  discuss the importance of adding supports  2)  identify the patient's current healthy supports and plan what to add.  An emphasis was placed on using counselor, doctor, therapy groups, 12-step groups, and problem-specific support groups to expand supports.    The patient expressed full comprehension of the concepts presented, and agreed that there is a need to add more supports.  The patient stated she has a therapist, a psychiatrist, a primary care physician and a supportive RN husband although she feels he does not understand how she feels and is resentful of that.  She stated she needs support groups and to find things to fill in her time like classes or friendships.  She was very focused on trying to get moved to the Unit (400) instead of merely programming there.   Type of Therapy:  Process Group with Motivational Interviewing  Participation Level:  Active  Participation Quality:  Attentive and Sharing and Monopolizing but Redirectable  Affect:  Blunted  Cognitive:  Disorganized  Insight:  Developing/Improving  Engagement in Therapy:  Engaged  Modes of Intervention:   Education, Support and ConAgra FoodsProcessing  Natalya Domzalski Grossman-Orr, LCSW 05/09/2017    1:08 PM

## 2017-05-10 LAB — CBC WITH DIFFERENTIAL/PLATELET
BASOS ABS: 0 10*3/uL (ref 0.0–0.1)
Basophils Relative: 0 %
Eosinophils Absolute: 0.2 10*3/uL (ref 0.0–0.7)
Eosinophils Relative: 2 %
HEMATOCRIT: 40.5 % (ref 36.0–46.0)
Hemoglobin: 13.8 g/dL (ref 12.0–15.0)
Lymphocytes Relative: 25 %
Lymphs Abs: 2.3 10*3/uL (ref 0.7–4.0)
MCH: 31.2 pg (ref 26.0–34.0)
MCHC: 34.1 g/dL (ref 30.0–36.0)
MCV: 91.6 fL (ref 78.0–100.0)
MONO ABS: 0.7 10*3/uL (ref 0.1–1.0)
Monocytes Relative: 7 %
NEUTROS PCT: 66 %
Neutro Abs: 6.2 10*3/uL (ref 1.7–7.7)
Platelets: 265 10*3/uL (ref 150–400)
RBC: 4.42 MIL/uL (ref 3.87–5.11)
RDW: 13.1 % (ref 11.5–15.5)
WBC: 9.4 10*3/uL (ref 4.0–10.5)

## 2017-05-10 LAB — URINALYSIS, COMPLETE (UACMP) WITH MICROSCOPIC
BACTERIA UA: NONE SEEN
BILIRUBIN URINE: NEGATIVE
Glucose, UA: NEGATIVE mg/dL
Hgb urine dipstick: NEGATIVE
Ketones, ur: NEGATIVE mg/dL
LEUKOCYTES UA: NEGATIVE
NITRITE: NEGATIVE
Protein, ur: NEGATIVE mg/dL
Specific Gravity, Urine: 1.005 (ref 1.005–1.030)
pH: 7 (ref 5.0–8.0)

## 2017-05-10 MED ORDER — CARISOPRODOL 350 MG PO TABS: 350.0000 mg | ORAL_TABLET | Freq: Every day | ORAL | Status: DC

## 2017-05-10 MED ORDER — GABAPENTIN 300 MG PO CAPS
600.0000 mg | ORAL_CAPSULE | Freq: Three times a day (TID) | ORAL | Status: DC
  Administered 2017-05-10 – 2017-05-15 (×15): 600 mg via ORAL
  Filled 2017-05-10 (×20): qty 2

## 2017-05-10 NOTE — Progress Notes (Signed)
Recreation Therapy Notes  Date: 05/10/17 Time: 0930 Location: 300 Hall Dayroom  Group Topic: Stress Management  Goal Area(s) Addresses:  Patient will verbalize importance of using healthy stress management.  Patient will identify positive emotions associated with healthy stress management.   Intervention: Stress Management  Activity :  Letting Go.  LRT introduced the stress management technique of meditation.  LRT played a meditation on the importance of letting go of the past and things that hold us back.  Patients were to follow along as the meditation played to participate in the activity.    Education:  Stress Management, Discharge Planning.   Education Outcome: Acknowledges edcuation/In group clarification offered/Needs additional education  Clinical Observations/Feedback: Pt did not attend group.    Lamia Mariner, LRT/CTRS         Vincente Asbridge A 05/10/2017 11:57 AM 

## 2017-05-10 NOTE — Progress Notes (Signed)
D: Patient continues to report depressive symptoms and hopelessness.  She reports her sleep is fair; appetite is fair; concentration is poor.  Patient has passive thoughts of self harm, however, she contracts for safety on the unit.  Patient presents with anxious affect and depressed mood.  She is tearful and her voice is shaky.  Her goal today is to work on "coping skills and be happy."   A: Continue to monitor medication management and MD orders. Safety checks continued every 15 minutes per protocol.   R: Patient is receptive to staff; her behavior is appropriate.

## 2017-05-10 NOTE — Progress Notes (Signed)
Marcelle Smiling. Katelyn Galloway had been up and visible in milieu this evening, did attend and participate in evening group activity. She was seen as sad and tearful throughout the night and spoke about how she is not happy and has not been happy for a long time and would like to be happy again. She spoke about her mental health illness and spoke about how it has been like a poison to her and spoke about being in an unhealthy relationship but not knowing how to get out of it. Cala BradfordKimberly was able to receive all medications this evening, did endorse passive SI but able to contract for safety in the hospital. A. Support and encouragement provided. R. Safety maintained, will continue to monitor.

## 2017-05-10 NOTE — Plan of Care (Signed)
Problem: Self-Concept: Goal: Ability to disclose and discuss suicidal ideas will improve Outcome: Progressing Patient is able to verbalize her thoughts of self harm and is able to contract for safety.

## 2017-05-10 NOTE — Progress Notes (Addendum)
[facility] MD Progress Note  05/10/2017 1:44 PM Katelyn Galloway  MRN:  [id number]  Subjective: patient states she is feeling quite anxious, and states " I feel like I am having a panic attack". Reports LLQ pain , discomfort x 3-4 days, but denies any vomiting, has had bowel movements, and has tolerated PO well. States she thinks it may be a genitourinary issue, as she feels her urine has a strong odor. Denies vaginal discharge, denies dysuria, no fever , no chills. Reports ongoing depression, anxiety, but contracts for safety.  Objective:  I have discussed case with treatment team and have met with patient. She is 56 years old and has been diagnosed with PTSD , Bipolar Disorder, and Cluster B personality traits in the past . Presented for worsening depression, suicidal ideations, and grief /loss symptoms related to death of father last year.  Of note, patient is on long term opiate management for chronic pain. She is also on Valium PRNs, Soma, Neurontin. She denies side effects, denies somnolence. I have reviewed these medications with patient, and reviewed potential dangers associated with combination , such as increased risk of sedation, respiratory depression. Patient states she has been on combination for " long time" without symptoms. Initially reluctant, guarded, but did agree to initiate taper of Soma, and to avoid MSIR, which was ordered as PRN in addition to standing opiate.  As above , at this time her major concern is R pelvil, RLQ pain x 3-4 days. Patient does not appear to be in any acute distress or discomfort . She does present anxious . No fever. Visible in day room, milieu. Presents vaguely needy, anxious, but no disruptive or overtly agitated behaviors.    Principal Problem: Bipolar disorder with current episode depressed ([place])  Diagnosis:   Patient Active Problem List   Diagnosis Date Noted  . Bipolar disorder with current episode depressed ([place]) [F31.30] 05/05/2017  . 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 Asthma [J45.909] 03/24/2017  . Tobacco use disorder [F17.200] 03/24/2017  . Cannabis abuse [F12.10] 03/23/2017  . Hypothyroidism [E03.9] 05/15/2015  . GAD (generalized anxiety disorder) [F41.1] 07/10/2010  . Chronic bilateral low back pain [M54.5, G89.29] 07/09/2009   Total Time spent with patient: 20 minutes  Past Psychiatric History: Bipolar disorder, depressed, Substance use disorder.  Past Medical History:  Past Medical History:  Diagnosis Date  . Anemia 1970  . Anxiety   . Arthritis   . Asthma    child  . Bronchitis    "years ago"  . Bronchitis    chronic hx  . Chronic back pain    hx of  . Depression   . Fibromyalgia   . GERD (gastroesophageal reflux disease)    occ  . Headache(784.0)    migraines  . Heart palpitations    occ if get anxious- no tests  . History of kidney stones   . Hypothyroidism   . Irritable bowel syndrome (IBS)   . Seizures ([place])    x 2 during pregnancy per pt was not diagnosed with eclampsia    Past Surgical History:  Procedure Laterality Date  . ABDOMINAL HYSTERECTOMY  1992  . BACK SURGERY     x2  . CHOLECYSTECTOMY  2009 or 2010  . HARDWARE REMOVAL  12/29/2012   Procedure: HARDWARE REMOVAL;  Surgeon: Eustace Moore, MD;  Location: [facility];  Service: Neurosurgery;  Laterality: N/A;  Lumbar hardware extraction  . kindey stone removal    .  KNEE ARTHROSCOPY     right knee  . SPINAL CORD STIMULATOR INSERTION N/A 01/25/2015   Procedure: LUMBAR SPINAL CORD STIMULATOR INSERTION;  Surgeon: Bonna Gains, MD;  Location: [facility];  Service: Neurosurgery;  Laterality: N/A;  . TUBAL LIGATION     Family History:  Family History  Problem Relation Age of Onset  . Depression Mother   . Heart attack Mother   . Suicidality Father   . Heart disease Father   . Anesthesia problems Neg Hx    Family Psychiatric  History: See H&P  Social History:  History  Alcohol Use  . 0.6 oz/week   . 1 Glasses of wine per week     History  Drug Use  . Types: Marijuana    Social History   Social History  . Marital status: Divorced    Spouse name: N/A  . Number of children: N/A  . Years of education: N/A   Social History Main Topics  . Smoking status: Current Some Day Smoker    Packs/day: 0.50    Years: 38.00    Types: Cigarettes  . Smokeless tobacco: Never Used     Comment: Pt reports using an electric cigarette ~ 2 times a day  . Alcohol use 0.6 oz/week    1 Glasses of wine per week  . Drug use: Yes    Types: Marijuana  . Sexual activity: Not Currently    Birth control/ protection: Surgical   Other Topics Concern  . None   Social History Narrative  . None   Additional Social History:    Pain Medications: See home med list Prescriptions: See home med list Over the Counter: See home med list History of alcohol / drug use?: No history of alcohol / drug abuse Name of Substance 1: THC 1 - Age of First Use: 14 1 - Amount (size/oz): "1/2 to one joint" 1 - Frequency: daily for pain 1 - Duration: ongoing since 1999 1 - Last Use / Amount: 05/03/17  Sleep: improving  Appetite:  improving   Current Medications: Current Facility-Administered Medications  Medication Dose Route Frequency Provider Last Rate Last Dose  . acetaminophen (TYLENOL) tablet 650 mg  650 mg Oral Q6H PRN Laverle Hobby, PA-C   650 mg at 05/08/17 1151  . albuterol (PROVENTIL HFA;VENTOLIN HFA) 108 (90 Base) MCG/ACT inhaler 1-2 puff  1-2 puff Inhalation Q6H PRN Laverle Hobby, PA-C      . alum & mag hydroxide-simeth (MAALOX/MYLANTA) 200-200-20 MG/5ML suspension 30 mL  30 mL Oral Q4H PRN Laverle Hobby, PA-C      . carbamazepine (TEGRETOL) tablet 200 mg  200 mg Oral QHS Patriciaann Clan E, PA-C   200 mg at 05/09/17 2311  . [START ON 05/11/2017] carisoprodol (SOMA) tablet 350 mg  350 mg Oral Daily Cobos, Fernando A, MD      . diazepam (VALIUM) tablet 5 mg  5 mg Oral BID PRN Ursula Alert, MD    5 mg at 05/10/17 1104  . docusate sodium (COLACE) capsule 100 mg  100 mg Oral Daily Derrill Center, NP   100 mg at 05/10/17 0818  . gabapentin (NEURONTIN) capsule 800 mg  800 mg Oral TID Lindell Spar I, NP   800 mg at 05/10/17 1142  . ibuprofen (ADVIL,MOTRIN) tablet 600 mg  600 mg Oral Q6H PRN Derrill Center, NP   600 mg at 05/10/17 5409  . levothyroxine (SYNTHROID, LEVOTHROID) tablet 25 mcg  25 mcg Oral QAC  breakfast Laverle Hobby, PA-C   25 mcg at 05/10/17 5366  . lidocaine (LIDODERM) 5 % 1 patch  1 patch Transdermal Q24H Laverle Hobby, PA-C   1 patch at 05/10/17 4403  . magnesium hydroxide (MILK OF MAGNESIA) suspension 30 mL  30 mL Oral Daily PRN Patriciaann Clan E, PA-C      . morphine (MS CONTIN) 12 hr tablet 15 mg  15 mg Oral Q12H Nwoko, Agnes I, NP   15 mg at 05/10/17 0818  . nicotine (NICODERM CQ - dosed in mg/24 hours) patch 21 mg  21 mg Transdermal Daily Patriciaann Clan E, PA-C   21 mg at 05/10/17 4742  . polyethylene glycol (MIRALAX / GLYCOLAX) packet 17 g  17 g Oral Daily PRN Laverle Hobby, PA-C   17 g at 05/05/17 2135  . prazosin (MINIPRESS) capsule 2 mg  2 mg Oral QHS Laverle Hobby, PA-C   2 mg at 05/09/17 2311  . traZODone (DESYREL) tablet 50 mg  50 mg Oral QHS,MR X 1 Laverle Hobby, PA-C   50 mg at 05/09/17 2311  . venlafaxine XR (EFFEXOR-XR) 24 hr capsule 150 mg  150 mg Oral Q breakfast Laverle Hobby, PA-C   150 mg at 05/10/17 5956    Lab Results:  No results found for this or any previous visit (from the past 48 hour(s)). Blood Alcohol level:  Lab Results  Component Value Date   ETH <5 05/04/2017   ETH <5 38/75/6433   Metabolic Disorder Labs: Lab Results  Component Value Date   HGBA1C 5.2 03/24/2017   MPG 103 03/24/2017   No results found for: PROLACTIN Lab Results  Component Value Date   CHOL 173 03/24/2017   TRIG 139 03/24/2017   HDL 53 03/24/2017   CHOLHDL 3.3 03/24/2017   VLDL 28 03/24/2017   LDLCALC 92 03/24/2017    Physical  Findings: AIMS: Facial and Oral Movements Muscles of Facial Expression: None, normal Lips and Perioral Area: None, normal Jaw: None, normal Tongue: None, normal,Extremity Movements Upper (arms, wrists, hands, fingers): None, normal Lower (legs, knees, ankles, toes): None, normal, Trunk Movements Neck, shoulders, hips: None, normal, Overall Severity Severity of abnormal movements (highest score from questions above): None, normal Incapacitation due to abnormal movements: None, normal Patient's awareness of abnormal movements (rate only patient's report): No Awareness, Dental Status Current problems with teeth and/or dentures?: Yes Does patient usually wear dentures?: No  CIWA:    COWS:  COWS Total Score: 2  Musculoskeletal: Strength & Muscle Tone: within normal limits Gait & Station: normal Patient leans: N/A  Psychiatric Specialty Exam: Physical Exam  Nursing note and vitals reviewed. Constitutional: She is oriented to person, place, and time.  Cardiovascular: Normal rate.   Neurological: She is alert and oriented to person, place, and time.  Psychiatric: She has a normal mood and affect. Her behavior is normal.  : Nurse's noted & Vital signs reviewed.  Review of Systems  Genitourinary: Positive for dysuria.       Vaginal discharge odor  Psychiatric/Behavioral: Positive for depression, substance abuse and suicidal ideas.  reports pelvic discomfort, pain, no vomiting, no dysuria at this time, no fever, no chills  Blood pressure 125/83, pulse 70, temperature 98.1 F (36.7 C), temperature source Oral, resp. rate 16, height '5\' 4"'  (1.626 m), weight 62.6 kg (138 lb), SpO2 98 %.Body mass index is 23.69 kg/m.  General Appearance: well groomed  Eye Contact:   Good   Speech:  Normal  Volume:  Normal   Mood: mood improving, remains anxious   Affect:  anxious , improves partially during session with support, reassurance   Thought Process: Linear, associations intact   Orientation:  fully alert, attentive   Thought Content: no hallucinations, no delusions, somatically focused    Suicidal Thoughts:  No - denies current plan or intention of hurting self or of suicide, contracts for safety on unit  Homicidal Thoughts:  No- denies violent ideations  Memory:   recent and remote grossly intact   Judgement:  fair   Insight:  fair   Psychomotor Activity:  mildly anxious  Concentration:  Concentration: good  and Attention Span: good   Recall:  good   Fund of Knowledge:  good   Language:  good   Akathisia:  no   Handed:  Right  AIMS (if indicated):     Assets: resilience, desire for improvement  ADL's:  Intact   Cognition:  WNL   Sleep:  Number of Hours: 8     Assessment - patient presents anxious , ruminative, somatically focused, concerned about new onset RLQ pain, pelvic discomfort . She does not appear to be in any acute distress, has tolerated PO intake well, has no fever, has had bowel movements. Denies any clear symptoms of UTI at this time, but worries about urine having strong odor . She states she was sexually active recently prior to admission and worries about possible STD .  Patient is on Soma, Opiates, Valium- states she has been on these for a long period of time without adverse effects- we have discussed risks. She is presenting fully alert and attentive and does not present sedated .  At this time agrees to initiate Soma Taper, and to avoid MSIR.    Treatment Plan Summary: Encourage group and milieu participation to work on coping skills and symptom reduction Continue Tegretol 200 mgrs QDAY for mood disorder  Continue Valium 5 mgrs BID PRN for anxiety as needed  D/C MSIR PRNs  D/C Trazodone to minimize potential for sedation Decrease Soma to 350 mgrs QDAY- see above  Continue MS Contin 15 mgrs BID for pain  Continue Effexor XR 150 mgrs QAM for depression, anxiety Decrease  Neurontin to 600 mgrs TID for pain and anxiety  Continue Minipress 2 mgrs QHS for  nightmares  Check carbamazepine serum level in AM I have discussed case with Hospitalist - recommendation is to Check UA and UCx ,Abdominal X Ray. Depending on results and persistence of symptoms , may follow up with CT of Abdomen. At patient's request will also order STD workup, does not have any clear STD symptoms ( today denies vaginal discharge ) , but worries significantly about above .   Patient ID: Katelyn Galloway, female   DOB: 1961-05-25, 56 y.o.   MRN: [id number]

## 2017-05-10 NOTE — [facility] Group Notes (Signed)
[facility] LCSW Group Therapy  05/10/2017 1:15pm  Type of Therapy:  Group Therapy vercoming Obstacles  Participation Level:  Active  Participation Quality:  Appropriate   Affect:  Appropriate  Cognitive:  Appropriate and Oriented  Insight:  Developing/Improving and Improving  Engagement in Therapy:  Improving  Modes of Intervention:  Discussion, Exploration, Problem-solving and Support  Description of Group:   In this group patients will be encouraged to explore what they see as obstacles to their own wellness and recovery. They will be guided to discuss their thoughts, feelings, and behaviors related to these obstacles. The group will process together ways to cope with barriers, with attention given to specific choices patients can make. Each patient will be challenged to identify changes they are motivated to make in order to overcome their obstacles. This group will be process-oriented, with patients participating in exploration of their own experiences as well as giving and receiving support and challenge from other group members.  Summary of Patient Progress: Pt participated actively and discussed with the group her confusion related to ending certain relationships. Pt reports feeling that she would need to be in a facility for "a while" to be able to face her obstacles.    Therapeutic Modalities:   Cognitive Behavioral Therapy Solution Focused Therapy Motivational Interviewing Relapse Prevention Therapy   Katelyn ShanksLauren Katrianna Friesenhahn, LCSW 05/10/2017 3:49 PM

## 2017-05-10 NOTE — Tx Team (Signed)
Interdisciplinary Treatment and Diagnostic Plan Update  05/05/2017 Time of Session: 9:30am Katelyn CosierKimberly G Galloway MRN: [id number]  Principal Diagnosis: Bipolar disorder with current episode depressed (HCC)  Secondary Diagnoses: Principal Problem:   Bipolar disorder with current episode depressed (HCC) Active Problems:   Chronic bilateral low back pain   GAD (generalized anxiety disorder)   Current Medications:  Current Facility-Administered Medications  Medication Dose Route Frequency Provider Last Rate Last Dose  . acetaminophen (TYLENOL) tablet 650 mg  650 mg Oral Q6H PRN Kerry HoughSimon, Spencer E, PA-C   650 mg at 05/08/17 1151  . albuterol (PROVENTIL HFA;VENTOLIN HFA) 108 (90 Base) MCG/ACT inhaler 1-2 puff  1-2 puff Inhalation Q6H PRN Kerry HoughSimon, Spencer E, PA-C      . alum & mag hydroxide-simeth (MAALOX/MYLANTA) 200-200-20 MG/5ML suspension 30 mL  30 mL Oral Q4H PRN Kerry HoughSimon, Spencer E, PA-C      . carbamazepine (TEGRETOL) tablet 200 mg  200 mg Oral QHS Donell SievertSimon, Spencer E, PA-C   200 mg at 05/09/17 2311  . carisoprodol (SOMA) tablet 350 mg  350 mg Oral BID Armandina StammerNwoko, Agnes I, NP   350 mg at 05/09/17 0807  . diazepam (VALIUM) tablet 5 mg  5 mg Oral BID PRN Jomarie LongsEappen, Saramma, MD   5 mg at 05/10/17 1104  . docusate sodium (COLACE) capsule 100 mg  100 mg Oral Daily Oneta RackLewis, Tanika N, NP   100 mg at 05/10/17 0818  . gabapentin (NEURONTIN) capsule 800 mg  800 mg Oral TID Armandina StammerNwoko, Agnes I, NP   800 mg at 05/10/17 0818  . ibuprofen (ADVIL,MOTRIN) tablet 600 mg  600 mg Oral Q6H PRN Oneta RackLewis, Tanika N, NP   600 mg at 05/10/17 [id number]  . levothyroxine (SYNTHROID, LEVOTHROID) tablet 25 mcg  25 mcg Oral QAC breakfast Kerry HoughSimon, Spencer E, PA-C   25 mcg at 05/10/17 [id number]  . lidocaine (LIDODERM) 5 % 1 patch  1 patch Transdermal Q24H Kerry HoughSimon, Spencer E, PA-C   1 patch at 05/10/17 [id number]  . magnesium hydroxide (MILK OF MAGNESIA) suspension 30 mL  30 mL Oral Daily PRN Donell SievertSimon, Spencer E, PA-C      . morphine (MS CONTIN) 12 hr tablet 15 mg  15 mg  Oral Q12H Nwoko, Agnes I, NP   15 mg at 05/10/17 0818  . morphine (MSIR) tablet 15 mg  15 mg Oral Q12H PRN Nwoko, Agnes I, NP      . nicotine (NICODERM CQ - dosed in mg/24 hours) patch 21 mg  21 mg Transdermal Daily Donell SievertSimon, Spencer E, PA-C   21 mg at 05/10/17 [id number]  . polyethylene glycol (MIRALAX / GLYCOLAX) packet 17 g  17 g Oral Daily PRN Kerry HoughSimon, Spencer E, PA-C   17 g at 05/05/17 2135  . prazosin (MINIPRESS) capsule 2 mg  2 mg Oral QHS Kerry HoughSimon, Spencer E, PA-C   2 mg at 05/09/17 2311  . traZODone (DESYREL) tablet 50 mg  50 mg Oral QHS,MR X 1 Kerry HoughSimon, Spencer E, PA-C   50 mg at 05/09/17 2311  . venlafaxine XR (EFFEXOR-XR) 24 hr capsule 150 mg  150 mg Oral Q breakfast Kerry HoughSimon, Spencer E, PA-C   150 mg at 05/10/17 [id number]    PTA Medications: Prescriptions Prior to Admission  Medication Sig Dispense Refill Last Dose  . albuterol (PROVENTIL HFA;VENTOLIN HFA) 108 (90 Base) MCG/ACT inhaler INHALE TWO PUFFS BY MOUTH EVERY 6 HOURS AS NEEDED FOR WHEEZING OR FOR SHORTNESS OF BREATH   Past Month at Unknown time  . carbamazepine (TEGRETOL) 200 MG  tablet Take 200 mg by mouth at bedtime.    Past Week at Unknown time  . carisoprodol (SOMA) 350 MG tablet Take 0.5 tablets (175 mg total) by mouth 2 (two) times daily. spasm (Patient taking differently: Take 350 mg by mouth 2 (two) times daily. spasm) 1 tablet 0 05/04/2017 at Unknown time  . Cholecalciferol (VITAMIN D3) 5000 units CAPS Take by mouth.   05/04/2017 at Unknown time  . diazepam (VALIUM) 5 MG tablet Take 1 tablet (5 mg total) by mouth every 12 (twelve) hours as needed for anxiety. (Patient taking differently: Take 10 mg by mouth every 12 (twelve) hours as needed for anxiety. ) 1 tablet 0 05/04/2017 at Unknown time  . gabapentin (NEURONTIN) 600 MG tablet Take 600 mg by mouth 3 (three) times daily.    05/04/2017 at Unknown time  . levothyroxine (SYNTHROID, LEVOTHROID) 25 MCG tablet Take 25 mcg by mouth daily before breakfast.    05/04/2017 at Unknown time  . lidocaine  (LIDODERM) 5 % Place 1 patch onto the skin daily. Remove & Discard patch within 12 hours or as directed by MD 30 patch 0 Past Week at Unknown time  . lidocaine (LIDODERM) 5 % Place onto the skin.     . meloxicam (MOBIC) 15 MG tablet Take 15 mg by mouth daily.    05/04/2017 at Unknown time  . Morphine Sulfate ER 15 MG TBEA Take 15 mg by mouth 2 (two) times daily as needed. Pain   05/04/2017 at Unknown time  . prazosin (MINIPRESS) 1 MG capsule Take 2 mg by mouth at bedtime.    Past Week at Unknown time  . prazosin (MINIPRESS) 1 MG capsule Take by mouth.     . venlafaxine XR (EFFEXOR-XR) 150 MG 24 hr capsule Take 1 capsule (150 mg total) by mouth daily with breakfast. 30 capsule 0 05/04/2017 at Unknown time  . venlafaxine XR (EFFEXOR-XR) 150 MG 24 hr capsule Take by mouth.     . Polyvinyl Alcohol-Povidone PF 1.4-0.6 % SOLN Apply to eye.       Treatment Modalities: Medication Management, Group therapy, Case management,  1 to 1 session with clinician, Psychoeducation, Recreational therapy.  Patient Stressors: Financial difficulties Health problems Marital or family conflict  Patient Strengths: Average or above average intelligence Capable of independent living General fund of knowledge Motivation for treatment/growth  Physician Treatment Plan for Primary Diagnosis: Bipolar disorder with current episode depressed (HCC) Long Term Goal(s): Improvement in symptoms so as ready for discharge  Short Term Goals: Ability to identify changes in lifestyle to reduce recurrence of condition will improve Ability to verbalize feelings will improve Ability to disclose and discuss suicidal ideas Ability to identify and develop effective coping behaviors will improve Compliance with prescribed medications will improve Ability to identify triggers associated with substance abuse/mental health issues will improve  Medication Management: Evaluate patient's response, side effects, and tolerance of medication  regimen.  Therapeutic Interventions: 1 to 1 sessions, Unit Group sessions and Medication administration.  Evaluation of Outcomes: Progressing  Physician Treatment Plan for Secondary Diagnosis: Principal Problem:   Bipolar disorder with current episode depressed (HCC) Active Problems:   Chronic bilateral low back pain   GAD (generalized anxiety disorder)   Long Term Goal(s): Improvement in symptoms so as ready for discharge  Short Term Goals: Ability to identify changes in lifestyle to reduce recurrence of condition will improve Ability to verbalize feelings will improve Ability to disclose and discuss suicidal ideas Ability to identify and develop effective  coping behaviors will improve Compliance with prescribed medications will improve Ability to identify triggers associated with substance abuse/mental health issues will improve  Medication Management: Evaluate patient's response, side effects, and tolerance of medication regimen.  Therapeutic Interventions: 1 to 1 sessions, Unit Group sessions and Medication administration.  Evaluation of Outcomes: Progressing   RN Treatment Plan for Primary Diagnosis: Bipolar disorder with current episode depressed (HCC) Long Term Goal(s): Knowledge of disease and therapeutic regimen to maintain health will improve  Short Term Goals: Ability to verbalize feelings will improve, Ability to disclose and discuss suicidal ideas and Ability to identify and develop effective coping behaviors will improve  Medication Management: RN will administer medications as ordered by provider, will assess and evaluate patient's response and provide education to patient for prescribed medication. RN will report any adverse and/or side effects to prescribing provider.  Therapeutic Interventions: 1 on 1 counseling sessions, Psychoeducation, Medication administration, Evaluate responses to treatment, Monitor vital signs and CBGs as ordered, Perform/monitor CIWA,  COWS, AIMS and Fall Risk screenings as ordered, Perform wound care treatments as ordered.  Evaluation of Outcomes: Progressing   LCSW Treatment Plan for Primary Diagnosis: Bipolar disorder with current episode depressed (HCC) Long Term Goal(s): Safe transition to appropriate next level of care at discharge, Engage patient in therapeutic group addressing interpersonal concerns.  Short Term Goals: Engage patient in aftercare planning with referrals and resources, Identify triggers associated with mental health/substance abuse issues and Increase skills for wellness and recovery  Therapeutic Interventions: Assess for all discharge needs, 1 to 1 time with Social worker, Explore available resources and support systems, Assess for adequacy in community support network, Educate family and significant other(s) on suicide prevention, Complete Psychosocial Assessment, Interpersonal group therapy.  Evaluation of Outcomes: Progressing   Progress in Treatment: Attending groups: Yes  Participating in groups:Yes  Taking medication as prescribed: Yes, MD continues to assess for medication changes as needed Toleration medication: Yes, no side effects reported at this time Family/Significant other contact made: No, CSW attempted 2x to contact neighbor Patient understands diagnosis: Continuing to assess Discussing patient identified problems/goals with staff: Yes Medical problems stabilized or resolved: Yes Denies suicidal/homicidal ideation: Endorsing passive SI Issues/concerns per patient self-inventory: None Other: N/A  New problem(s) identified: None identified at this time.   New Short Term/Long Term Goal(s): None identified at this time.   Discharge Plan or Barriers: Pt will return home and follow-up with outpatient services with [facility]  Reason for Continuation of Hospitalization: Anxiety Depression Medication stabilization Suicidal ideation  Estimated Length of Stay:  1-3 days  Attendees: Patient: 05/05/2017  11:05 AM  Physician: Dr. Jama Flavors 05/05/2017  11:05 AM  Nursing: Dorthula Perfect, RN 05/05/2017  11:05 AM  RN Care Manager: Onnie Boer, RN 05/05/2017  11:05 AM  Social Worker: Vernie Shanks, LCSW; Donnelly Stager, LCSWA; Heather Smart, LCSW 05/05/2017  11:05 AM  Recreational Therapist:  05/05/2017  11:05 AM  Other: [name], NP; Patria Mane, NP 05/05/2017  11:05 AM  Other:  05/05/2017  11:05 AM  Other: 05/05/2017  11:05 AM    Scribe for Treatment Team: Verdene Lennert, LCSW 05/05/2017 11:05 AM

## 2017-05-11 ENCOUNTER — Ambulatory Visit (HOSPITAL_COMMUNITY)
Admission: EM | Admit: 2017-05-11 | Discharge: 2017-05-11 | Disposition: A | Discharge status: Home / Self Care | Payer: Medicare Other | Source: Intra-hospital | Attending: Psychiatry | Admitting: Psychiatry

## 2017-05-11 LAB — GC/CHLAMYDIA PROBE AMP (~~LOC~~) NOT AT ARMC
BACTERIAL VAGINITIS: NEGATIVE
Candida vaginitis: NEGATIVE
Chlamydia: NEGATIVE
Chlamydia: NEGATIVE
Neisseria Gonorrhea: NEGATIVE
Neisseria Gonorrhea: NEGATIVE
TRICH (WINDOWPATH): NEGATIVE

## 2017-05-11 LAB — HIV ANTIBODY (ROUTINE TESTING W REFLEX): HIV SCREEN 4TH GENERATION: NONREACTIVE

## 2017-05-11 LAB — RPR: RPR Ser Ql: NONREACTIVE

## 2017-05-11 MED ORDER — CARISOPRODOL 350 MG PO TABS
175.0000 mg | ORAL_TABLET | Freq: Every day | ORAL | Status: DC
  Filled 2017-05-11: qty 1

## 2017-05-11 NOTE — Progress Notes (Signed)
Recreation Therapy Notes  Animal-Assisted Activity (AAA) Program Checklist/Progress Notes Patient Eligibility Criteria Checklist & Daily Group note for Rec TxIntervention  Date: 05.22.2018 Time: 2:45pm [address]   AAA/T Program Assumption of Risk Form signed by Patient/ or Parent Legal Guardian Yes  Patient is free of allergies or sever asthma Yes  Patient reports no fear of animals Yes  Patient reports no history of cruelty to animals Yes  Patient understands his/her participation is voluntary Yes  Patient washes hands before animal contact Yes  Patient washes hands after animal contact Yes  Behavioral Response: Appropriate   Education:Hand Washing, Appropriate Animal Interaction   Education Outcome: Acknowledges education.   Clinical Observations/Feedback: Patient attended session and interacted appropriately with therapy dog and peers.  Marykay Lexenise L Shannen Vernon, LRT/CTRS        Jearl KlinefelterBlanchfield, Haruna Rohlfs L 05/11/2017 3:18 PM

## 2017-05-11 NOTE — [facility] Group Notes (Signed)
[facility] LCSW Group Therapy 05/11/2017 1:15 PM  Type of Therapy: Group Therapy- Feelings about Diagnosis  Participation Level: Active   Participation Quality:  Appropriate  Affect:  Appropriate  Cognitive: Alert and Oriented   Insight:  Developing   Engagement in Therapy: Developing/Improving and Engaged   Modes of Intervention: Clarification, Confrontation, Discussion, Education, Exploration, Limit-setting, Orientation, Problem-solving, Rapport Building, Dance movement psychotherapisteality Testing, Socialization and Support  Description of Group:   This group will allow patients to explore their thoughts and feelings about diagnoses they have received. Patients will be guided to explore their level of understanding and acceptance of these diagnoses. Facilitator will encourage patients to process their thoughts and feelings about the reactions of others to their diagnosis, and will guide patients in identifying ways to discuss their diagnosis with significant others in their lives. This group will be process-oriented, with patients participating in exploration of their own experiences as well as giving and receiving support and challenge from other group members.  Summary of Progress/Problems:  Pt participated appropriately, discussing frustration in relationships due to people having a lack of understanding related to mental illness. Pt encouraged other peers and shared relatable experiences.   Therapeutic Modalities:   Cognitive Behavioral Therapy Solution Focused Therapy Motivational Interviewing Relapse Prevention Therapy  Vernie ShanksLauren Quintana Canelo, LCSW 05/11/2017 4:00 PM

## 2017-05-11 NOTE — Progress Notes (Signed)
[facility] MD Progress Note  05/11/2017 5:02 PM Katelyn Galloway  MRN:  [id number]  Subjective: patient reports she had increased anxiety related to new roommate. She states that at this time she does feel calmer. She is somatically focused, and worries about her physical health, although was reassured by lab results. At this time denies suicidal ideations, contracts for safety , At this time denies medication side effects.   Objective:  I have discussed case with treatment team and have met with patient. Patient presents partially improved compared to admission- currently appears calm, with reactive , appropriate affect, but as discussed with nursing staff, has had episodes of increased anxiety, tearfulness. She had reported RLQ discomfort- at this time does not appear to be in any acute distress or discomfort. She reported concern that she might have UTI or STD. We reviewed labs - STD workup negative, UA unremarkable. Abdominal X ray did demonstrate non obstructing R renal calculi, and patient states she has a history of kidney stones . Of note UA is clear- no microscopic hematuria. Denies medication side effects.  No disruptive behaviors on unit. Has tolerated Soma taper well, without increase in pain or discomfort .    Principal Problem: Bipolar disorder with current episode depressed ([place])  Diagnosis:   Patient Active Problem List   Diagnosis Date Noted  . Bipolar disorder with current episode depressed ([place]) [F31.30] 05/05/2017  . 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 Asthma [J45.909] 03/24/2017  . Tobacco use disorder [F17.200] 03/24/2017  . Cannabis abuse [F12.10] 03/23/2017  . Hypothyroidism [E03.9] 05/15/2015  . GAD (generalized anxiety disorder) [F41.1] 07/10/2010  . Chronic bilateral low back pain [M54.5, G89.29] 07/09/2009   Total Time spent with patient: 20 minutes  Past Psychiatric History: Bipolar disorder, depressed, Substance use  disorder.  Past Medical History:  Past Medical History:  Diagnosis Date  . Anemia 1970  . Anxiety   . Arthritis   . Asthma    child  . Bronchitis    "years ago"  . Bronchitis    chronic hx  . Chronic back pain    hx of  . Depression   . Fibromyalgia   . GERD (gastroesophageal reflux disease)    occ  . Headache(784.0)    migraines  . Heart palpitations    occ if get anxious- no tests  . History of kidney stones   . Hypothyroidism   . Irritable bowel syndrome (IBS)   . Seizures ([place])    x 2 during pregnancy per pt was not diagnosed with eclampsia    Past Surgical History:  Procedure Laterality Date  . ABDOMINAL HYSTERECTOMY  1992  . BACK SURGERY     x2  . CHOLECYSTECTOMY  2009 or 2010  . HARDWARE REMOVAL  12/29/2012   Procedure: HARDWARE REMOVAL;  Surgeon: Eustace Moore, MD;  Location: [facility];  Service: Neurosurgery;  Laterality: N/A;  Lumbar hardware extraction  . kindey stone removal    . KNEE ARTHROSCOPY     right knee  . SPINAL CORD STIMULATOR INSERTION N/A 01/25/2015   Procedure: LUMBAR SPINAL CORD STIMULATOR INSERTION;  Surgeon: Bonna Gains, MD;  Location: [facility] NEURO ORS;  Service: Neurosurgery;  Laterality: N/A;  . TUBAL LIGATION     Family History:  Family History  Problem Relation Age of Onset  . Depression Mother   . Heart attack Mother   . Suicidality Father   . Heart disease Father   . Anesthesia problems Neg  Hx    Family Psychiatric  History: See H&P  Social History:  History  Alcohol Use  . 0.6 oz/week  . 1 Glasses of wine per week     History  Drug Use  . Types: Marijuana    Social History   Social History  . Marital status: Divorced    Spouse name: N/A  . Number of children: N/A  . Years of education: N/A   Social History Main Topics  . Smoking status: Current Some Day Smoker    Packs/day: 0.50    Years: 38.00    Types: Cigarettes  . Smokeless tobacco: Never Used     Comment: Pt reports using an electric cigarette ~ 2  times a day  . Alcohol use 0.6 oz/week    1 Glasses of wine per week  . Drug use: Yes    Types: Marijuana  . Sexual activity: Not Currently    Birth control/ protection: Surgical   Other Topics Concern  . None   Social History Narrative  . None   Additional Social History:    Pain Medications: See home med list Prescriptions: See home med list Over the Counter: See home med list History of alcohol / drug use?: No history of alcohol / drug abuse Name of Substance 1: THC 1 - Age of First Use: 14 1 - Amount (size/oz): "1/2 to one joint" 1 - Frequency: daily for pain 1 - Duration: ongoing since 1999 1 - Last Use / Amount: 05/03/17  Sleep: Good  Appetite:  Good  Current Medications: Current Facility-Administered Medications  Medication Dose Route Frequency Provider Last Rate Last Dose  . acetaminophen (TYLENOL) tablet 650 mg  650 mg Oral Q6H PRN Laverle Hobby, PA-C   650 mg at 05/08/17 1151  . albuterol (PROVENTIL HFA;VENTOLIN HFA) 108 (90 Base) MCG/ACT inhaler 1-2 puff  1-2 puff Inhalation Q6H PRN Laverle Hobby, PA-C      . alum & mag hydroxide-simeth (MAALOX/MYLANTA) 200-200-20 MG/5ML suspension 30 mL  30 mL Oral Q4H PRN Laverle Hobby, PA-C      . carbamazepine (TEGRETOL) tablet 200 mg  200 mg Oral QHS Patriciaann Clan E, PA-C   200 mg at 05/10/17 2151  . carisoprodol (SOMA) tablet 350 mg  350 mg Oral Daily Cobos, Myer Peer, MD   Stopped at 05/11/17 [phone]  . diazepam (VALIUM) tablet 5 mg  5 mg Oral BID PRN Ursula Alert, MD   5 mg at 05/11/17 0804  . docusate sodium (COLACE) capsule 100 mg  100 mg Oral Daily Derrill Center, NP   100 mg at 05/11/17 0759  . gabapentin (NEURONTIN) capsule 600 mg  600 mg Oral TID Cobos, Myer Peer, MD   600 mg at 05/11/17 1151  . ibuprofen (ADVIL,MOTRIN) tablet 600 mg  600 mg Oral Q6H PRN Derrill Center, NP   600 mg at 05/11/17 1637  . levothyroxine (SYNTHROID, LEVOTHROID) tablet 25 mcg  25 mcg Oral QAC breakfast Laverle Hobby, PA-C    25 mcg at 05/11/17 0100  . lidocaine (LIDODERM) 5 % 1 patch  1 patch Transdermal Q24H Laverle Hobby, PA-C   1 patch at 05/11/17 0759  . magnesium hydroxide (MILK OF MAGNESIA) suspension 30 mL  30 mL Oral Daily PRN Patriciaann Clan E, PA-C      . morphine (MS CONTIN) 12 hr tablet 15 mg  15 mg Oral Q12H Nwoko, Herbert Pun I, NP   15 mg at 05/11/17 0758  . nicotine (  NICODERM CQ - dosed in mg/24 hours) patch 21 mg  21 mg Transdermal Daily Patriciaann Clan E, PA-C   21 mg at 05/11/17 0804  . polyethylene glycol (MIRALAX / GLYCOLAX) packet 17 g  17 g Oral Daily PRN Laverle Hobby, PA-C   17 g at 05/05/17 2135  . prazosin (MINIPRESS) capsule 2 mg  2 mg Oral QHS Patriciaann Clan E, PA-C   2 mg at 05/10/17 2151  . venlafaxine XR (EFFEXOR-XR) 24 hr capsule 150 mg  150 mg Oral Q breakfast Laverle Hobby, PA-C   150 mg at 05/11/17 0630    Lab Results:  Results for orders placed or performed during the hospital encounter of 05/05/17 (from the past 48 hour(s))  GC/Chlamydia probe amp ([place])not at [facility]     Status: None   Collection Time: 05/10/17 12:00 AM  Result Value Ref Range   Chlamydia Negative     Comment: Normal Reference Range - Negative   Neisseria gonorrhea Negative     Comment: Normal Reference Range - Negative  Urinalysis, Complete w Microscopic     Status: Abnormal   Collection Time: 05/10/17  5:16 PM  Result Value Ref Range   Color, Urine COLORLESS (A) YELLOW   APPearance CLEAR CLEAR   Specific Gravity, Urine 1.005 1.005 - 1.030   pH 7.0 5.0 - 8.0   Glucose, UA NEGATIVE NEGATIVE mg/dL   Hgb urine dipstick NEGATIVE NEGATIVE   Bilirubin Urine NEGATIVE NEGATIVE   Ketones, ur NEGATIVE NEGATIVE mg/dL   Protein, ur NEGATIVE NEGATIVE mg/dL   Nitrite NEGATIVE NEGATIVE   Leukocytes, UA NEGATIVE NEGATIVE   RBC / HPF 0-5 0 - 5 RBC/hpf   WBC, UA 0-5 0 - 5 WBC/hpf   Bacteria, UA NONE SEEN NONE SEEN   Squamous Epithelial / LPF 0-5 (A) NONE SEEN    Comment: Performed at [facility], [place] [address] [place], [place] [address]  RPR     Status: None   Collection Time: 05/10/17  6:12 PM  Result Value Ref Range   RPR Ser Ql Non Reactive Non Reactive    Comment: (NOTE) Performed At: [facility] [place], [place] [id number] Lindon Romp MD [id number] Performed at [facility], [place] [address] [place], [place] [address]   HIV antibody     Status: None   Collection Time: 05/10/17  6:12 PM  Result Value Ref Range   HIV Screen 4th Generation wRfx Non Reactive Non Reactive    Comment: (NOTE) Performed At: [facility] [place], [place] [id number] Lindon Romp MD [id number] Performed at [facility], [place] [address], [place], [place] [address]   CBC with Differential/Platelet     Status: None   Collection Time: 05/10/17  6:12 PM  Result Value Ref Range   WBC 9.4 4.0 - 10.5 K/uL   RBC 4.42 3.87 - 5.11 MIL/uL   Hemoglobin 13.8 12.0 - 15.0 g/dL   HCT 40.5 36.0 - 46.0 %   MCV 91.6 78.0 - 100.0 fL   MCH 31.2 26.0 - 34.0 pg   MCHC 34.1 30.0 - 36.0 g/dL   RDW 13.1 11.5 - 15.5 %   Platelets 265 150 - 400 K/uL   Neutrophils Relative % 66 %   Neutro Abs 6.2 1.7 - 7.7 K/uL   Lymphocytes Relative 25 %   Lymphs Abs 2.3 0.7 - 4.0 K/uL   Monocytes Relative 7 %  Monocytes Absolute 0.7 0.1 - 1.0 K/uL   Eosinophils Relative 2 %   Eosinophils Absolute 0.2 0.0 - 0.7 K/uL   Basophils Relative 0 %   Basophils Absolute 0.0 0.0 - 0.1 K/uL    Comment: Performed at [facility], [place] [address] [place], [place] [address]   Blood Alcohol level:  Lab Results  Component Value Date   [facility] <5 05/04/2017   ETH <5 69/67/8938   Metabolic Disorder Labs: Lab Results  Component Value Date   HGBA1C 5.2 03/24/2017   MPG 103 03/24/2017   No results found for: PROLACTIN Lab Results  Component Value Date   CHOL 173 03/24/2017   TRIG 139 03/24/2017   HDL 53  03/24/2017   CHOLHDL 3.3 03/24/2017   VLDL 28 03/24/2017   LDLCALC 92 03/24/2017    Physical Findings: AIMS: Facial and Oral Movements Muscles of Facial Expression: None, normal Lips and Perioral Area: None, normal Jaw: None, normal Tongue: None, normal,Extremity Movements Upper (arms, wrists, hands, fingers): None, normal Lower (legs, knees, ankles, toes): None, normal, Trunk Movements Neck, shoulders, hips: None, normal, Overall Severity Severity of abnormal movements (highest score from questions above): None, normal Incapacitation due to abnormal movements: None, normal Patient's awareness of abnormal movements (rate only patient's report): No Awareness, Dental Status Current problems with teeth and/or dentures?: Yes Does patient usually wear dentures?: No  CIWA:    COWS:  COWS Total Score: 0  Musculoskeletal: Strength & Muscle Tone: within normal limits Gait & Station: normal Patient leans: N/A  Psychiatric Specialty Exam: Physical Exam  Nursing note and vitals reviewed. Constitutional: She is oriented to person, place, and time.  Cardiovascular: Normal rate.   Neurological: She is alert and oriented to person, place, and time.  Psychiatric: She has a normal mood and affect. Her behavior is normal.  : Nurse's noted & Vital signs reviewed.  Review of Systems  Genitourinary: Positive for dysuria.       Vaginal discharge odor  Psychiatric/Behavioral: Positive for depression, substance abuse and suicidal ideas.  decreased RLQ pain today, no hematuria .   Blood pressure (!) 126/98, pulse 89, temperature 98.7 F (37.1 C), temperature source Oral, resp. rate 16, height 5' 4" (1.626 m), weight 62.6 kg (138 lb), SpO2 98 %.Body mass index is 23.69 kg/m.  General Appearance: well groomed  Eye Contact:   Good   Speech:  Normal   Volume:  Normal   Mood: mood presents improved compared to admission   Affect:  at this time improved , more reactive, episodes of increased  anxiety   Thought Process: Linear, associations intact   Orientation: fully alert, attentive   Thought Content: no psychotic symptoms , does not appear internally preoccupied   Suicidal Thoughts:denies any active suicidal or self injurious ideations  Homicidal Thoughts:   No- denies violent ideations  Memory:   recent and remote grossly intact   Judgement:  fair- improving   Insight:  fair- improving   Psychomotor Activity:  Normal  Concentration:  Concentration: good  and Attention Span: good   Recall:  good   Fund of Knowledge:  good   Language:  good   Akathisia:  no   Handed:  Right  AIMS (if indicated):     Assets: resilience, desire for improvement  ADL's:  Intact   Cognition:  WNL   Sleep:  Number of Hours: 8     Assessment - at this time patient presents calmer , with more reactive affect and  is , with reassurance, support, and review of normal lab findings, less severely anxious, and less somatically focused . Work up did reveal, via abdominal x ray, R non obstructive renal calculi, but UA is negative for microscopic hematuria.    Treatment Plan Summary: Encourage group and milieu participation to work on coping skills and symptom reduction Continue Tegretol 200 mgrs QDAY for mood disorder  Continue Valium 5 mgrs BID PRN for anxiety as needed  Decrease Soma to 175 mgrs QDAY- see above  Continue MS Contin 15 mgrs BID for pain  Continue Effexor XR 150 mgrs QAM for depression, anxiety Continue  Neurontin600 mgrs TID for pain and anxiety  Continue Minipress 2 mgrs QHS for nightmares  Treatment team working on disposition planning options  Patient ID: Katelyn Galloway, female   DOB: 1961/08/29, 56 y.o.   MRN: [id number]

## 2017-05-11 NOTE — Progress Notes (Signed)
Pt reports that her day has been ok.  She reports she still has significant anxiety, with passive suicidal thoughts that come and go.  She is able to contract for safety.  She reports that she is supposed to go tomorrow for an ultrasound of her abdomen as she has developed a severe pain in her R side that has gotten progressively worse.  She states that she has had kidney stones and feels it may be a kidney stone or a cyst on her ovary.  She says it hurts enough that it makes her forget her back pain.  Pt was informed that the first shift RN would call to get the appointment in the morning.  Pt has been in the dayroom most of the evening talking with peers.  She makes her needs known and was medicated per orders.  Support and encouragement offered.  Discharge plans are in process.  Pt plans to return home.  Safety maintained with q15 minute checks.

## 2017-05-11 NOTE — Plan of Care (Signed)
Problem: Coping: Goal: Ability to cope will improve Outcome: Not Progressing Patient is having a difficult time controlling her emotions.  Patient wants medication whenever she experiences anxiety.

## 2017-05-11 NOTE — Progress Notes (Signed)
Patient ID: Katelyn CosierKimberly G Thielke, female   DOB: 07/04/1961, 56 y.o.   MRN: [id number] D: Client visible on the unit, seen in hall, giggling interacting with peers. Client is euphoric at this time, "I'm pretty happy go lucky" Client reports "It's not me I want to hurt, it's the illness, if I could pour it out and still be happy go lucky me" "I've gotten pretty good advice" "group help a lot" A:Writer provided emotional support, encouraged client to continue to verbalize concerns. Medications reviewed, administered as ordered. R:Client is safe on the unit, attended group.

## 2017-05-11 NOTE — Progress Notes (Signed)
D: Patient tearful and anxious this morning.  Patient states, "I can't go to my room.  I need to bathe.  I need to take my valium.  I've been asking for it three times this morning!"  Patient appears inconsolable.  Informed her that she could take a bath once she returned from radiology.  She continues to complain of LLQ pain of unknown origin.  She reports passive SI stating she has "thoughts almost all the time."  Patient is upset because she has a roommate.  She rates her depression as a 9; hopelessness and anxiety as an 8.  Her goal today is to "gain control of emotions and crying."   A: Continue to monitor medication management and MD orders.  Safety checks completed every 15 minutes per protocol.  Offer support and encouragement as needed. R: Patient is redirectable and cooperative; she continues to sob and states, "I'm having an anxiety attack."

## 2017-05-11 NOTE — Progress Notes (Signed)
Pt attend wrap up group. Her day was a 5. She felt hurt today. Her goal deal with anxiety attack pain. She worry about her stomach. She found out from doctor she has 3 kidney stones. She has an optional to laugh a lot today and this has help her get back to trying to be herself with all her medical issues.

## 2017-05-11 NOTE — Progress Notes (Signed)
Adult Psychoeducational Group Note  Date:  05/11/2017 Time:  5:52 AM  Group Topic/Focus:  Wrap-Up Group:   The focus of this group is to help patients review their daily goal of treatment and discuss progress on daily workbooks.  Participation Level:  Active  Participation Quality:  Appropriate, Attentive, Sharing and Supportive  Affect:  Appropriate  Cognitive:  Appropriate  Insight: Good and Improving  Engagement in Group:  Developing/Improving, Distracting and Engaged  Modes of Intervention:  Discussion, Education, Socialization and Support  Additional Comments:  Pt shared in group she is working on organizing things with her ex husband about who will get the "house, car, etc"  States she is working on her relationship with her current partner/boyfriend.  Karleen HampshireFox, Angelus Hoopes Brittini 05/11/2017, 5:52 AM

## 2017-05-12 LAB — URINE CULTURE: Culture: NO GROWTH

## 2017-05-12 LAB — CARBAMAZEPINE, FREE AND TOTAL
CARBAMAZEPINE FREE: 1.3 ug/mL (ref 0.6–4.2)
Carbamazepine, Total: 5.7 ug/mL (ref 4.0–12.0)

## 2017-05-12 NOTE — Progress Notes (Signed)
Recreation Therapy Notes  Date: 05/12/17 Time: 0925 Location: [address] Dayroom  Group Topic: Stress Management  Goal Area(s) Addresses:  Patient will verbalize importance of using healthy stress management.  Patient will identify positive emotions associated with healthy stress management.   Intervention: Stress Management  Activity :  Progressive Muscle Relaxation.  LRT introduced the stress management technique of progressive muscle relaxation.  LRT read Galloway script to guide patients through the transitions to relax each muscle individually.  Patients were to follow along as LRT read script to engage in activity.  Education:  Stress Management, Discharge Planning.   Education Outcome: Acknowledges edcuation/In group clarification offered/Needs additional education  Clinical Observations/Feedback: Pt did not attend group.   Katelyn Galloway, LRT/CTRS         Katelyn Galloway 05/12/2017 11:42 AM 

## 2017-05-12 NOTE — Progress Notes (Signed)
[facility] MD Progress Note  05/12/2017 4:51 PM Katelyn Galloway  MRN:  [id number]  Subjective: Katelyn Galloway reports, "I'm doing well today. I was able to go outside today for some recreational time. I was able to get into the elevated without freaking out today. My mood is improving gradually".  Objective:  I have discussed case with treatment team and have met with patient. She present with an appropriate affect. Patient presents partially improved compared to admission- currently appears calm, with reactive , appropriate affect, but as discussed with nursing staff, says had episodes of increased anxiety yesterday. She had reported RLQ discomfort- at this time does not appear to be in any acute distress or discomfort. She reported concern that she might have UTI or STD. We reviewed labs - STD workup negative, UA unremarkable. Abdominal X ray did demonstrate non obstructing R renal calculi, and patient states she has a history of kidney stones . Of note UA is clear- no microscopic hematuria. Denies medication side effects.  No disruptive behaviors on unit. Has tolerated Soma taper well, without increase in pain or discomfort .   Principal Problem: Bipolar disorder with current episode depressed ([place])  Diagnosis:   Patient Active Problem List   Diagnosis Date Noted  . Bipolar disorder with current episode depressed ([place]) [F31.30] 05/05/2017  . 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 Asthma [J45.909] 03/24/2017  . Tobacco use disorder [F17.200] 03/24/2017  . Cannabis abuse [F12.10] 03/23/2017  . Hypothyroidism [E03.9] 05/15/2015  . GAD (generalized anxiety disorder) [F41.1] 07/10/2010  . Chronic bilateral low back pain [M54.5, G89.29] 07/09/2009   Total Time spent with patient: 15 minutes  Past Psychiatric History: Bipolar disorder, depressed, Substance use disorder.  Past Medical History:  Past Medical History:  Diagnosis Date  . Anemia 1970  . Anxiety   .  Arthritis   . Asthma    child  . Bronchitis    "years ago"  . Bronchitis    chronic hx  . Chronic back pain    hx of  . Depression   . Fibromyalgia   . GERD (gastroesophageal reflux disease)    occ  . Headache(784.0)    migraines  . Heart palpitations    occ if get anxious- no tests  . History of kidney stones   . Hypothyroidism   . Irritable bowel syndrome (IBS)   . Seizures ([place])    x 2 during pregnancy per pt was not diagnosed with eclampsia    Past Surgical History:  Procedure Laterality Date  . ABDOMINAL HYSTERECTOMY  1992  . BACK SURGERY     x2  . CHOLECYSTECTOMY  2009 or 2010  . HARDWARE REMOVAL  12/29/2012   Procedure: HARDWARE REMOVAL;  Surgeon: Eustace Moore, MD;  Location: [facility];  Service: Neurosurgery;  Laterality: N/A;  Lumbar hardware extraction  . kindey stone removal    . KNEE ARTHROSCOPY     right knee  . SPINAL CORD STIMULATOR INSERTION N/A 01/25/2015   Procedure: LUMBAR SPINAL CORD STIMULATOR INSERTION;  Surgeon: Bonna Gains, MD;  Location: [facility];  Service: Neurosurgery;  Laterality: N/A;  . TUBAL LIGATION     Family History:  Family History  Problem Relation Age of Onset  . Depression Mother   . Heart attack Mother   . Suicidality Father   . Heart disease Father   . Anesthesia problems Neg Hx    Family Psychiatric  History: See H&P  Social History:  History  Alcohol Use  . 0.6 oz/week  . 1 Glasses of wine per week     History  Drug Use  . Types: Marijuana    Social History   Social History  . Marital status: Divorced    Spouse name: N/A  . Number of children: N/A  . Years of education: N/A   Social History Main Topics  . Smoking status: Current Some Day Smoker    Packs/day: 0.50    Years: 38.00    Types: Cigarettes  . Smokeless tobacco: Never Used     Comment: Pt reports using an electric cigarette ~ 2 times a day  . Alcohol use 0.6 oz/week    1 Glasses of wine per week  . Drug use: Yes    Types: Marijuana   . Sexual activity: Not Currently    Birth control/ protection: Surgical   Other Topics Concern  . None   Social History Narrative  . None   Additional Social History:    Pain Medications: See home med list Prescriptions: See home med list Over the Counter: See home med list History of alcohol / drug use?: No history of alcohol / drug abuse Name of Substance 1: THC 1 - Age of First Use: 14 1 - Amount (size/oz): "1/2 to one joint" 1 - Frequency: daily for pain 1 - Duration: ongoing since 1999 1 - Last Use / Amount: 05/03/17  Sleep: Good  Appetite:  Good  Current Medications: Current Facility-Administered Medications  Medication Dose Route Frequency Provider Last Rate Last Dose  . acetaminophen (TYLENOL) tablet 650 mg  650 mg Oral Q6H PRN Laverle Hobby, PA-C   650 mg at 05/08/17 1151  . albuterol (PROVENTIL HFA;VENTOLIN HFA) 108 (90 Base) MCG/ACT inhaler 1-2 puff  1-2 puff Inhalation Q6H PRN Laverle Hobby, PA-C      . alum & mag hydroxide-simeth (MAALOX/MYLANTA) 200-200-20 MG/5ML suspension 30 mL  30 mL Oral Q4H PRN Patriciaann Clan E, PA-C   30 mL at 05/12/17 0837  . carbamazepine (TEGRETOL) tablet 200 mg  200 mg Oral QHS Patriciaann Clan E, PA-C   200 mg at 05/11/17 2243  . diazepam (VALIUM) tablet 5 mg  5 mg Oral BID PRN Ursula Alert, MD   5 mg at 05/11/17 0804  . docusate sodium (COLACE) capsule 100 mg  100 mg Oral Daily Derrill Center, NP   100 mg at 05/12/17 0835  . gabapentin (NEURONTIN) capsule 600 mg  600 mg Oral TID Cobos, Myer Peer, MD   600 mg at 05/12/17 1150  . ibuprofen (ADVIL,MOTRIN) tablet 600 mg  600 mg Oral Q6H PRN Derrill Center, NP   600 mg at 05/11/17 1637  . levothyroxine (SYNTHROID, LEVOTHROID) tablet 25 mcg  25 mcg Oral QAC breakfast Laverle Hobby, PA-C   25 mcg at 05/12/17 0630  . lidocaine (LIDODERM) 5 % 1 patch  1 patch Transdermal Q24H Laverle Hobby, PA-C   1 patch at 05/12/17 [phone]  . magnesium hydroxide (MILK OF MAGNESIA) suspension 30  mL  30 mL Oral Daily PRN Patriciaann Clan E, PA-C      . morphine (MS CONTIN) 12 hr tablet 15 mg  15 mg Oral Q12H Nwoko, Agnes I, NP   15 mg at 05/12/17 0835  . nicotine (NICODERM CQ - dosed in mg/24 hours) patch 21 mg  21 mg Transdermal Daily Patriciaann Clan E, PA-C   21 mg at 05/12/17 0834  . polyethylene glycol (MIRALAX / GLYCOLAX) packet 17  g  17 g Oral Daily PRN Laverle Hobby, PA-C   17 g at 05/05/17 2135  . prazosin (MINIPRESS) capsule 2 mg  2 mg Oral QHS Patriciaann Clan E, PA-C   2 mg at 05/11/17 2243  . venlafaxine XR (EFFEXOR-XR) 24 hr capsule 150 mg  150 mg Oral Q breakfast Laverle Hobby, PA-C   150 mg at 05/12/17 4081    Lab Results:  Results for orders placed or performed during the hospital encounter of 05/05/17 (from the past 48 hour(s))  Urinalysis, Complete w Microscopic     Status: Abnormal   Collection Time: 05/10/17  5:16 PM  Result Value Ref Range   Color, Urine COLORLESS (A) YELLOW   APPearance CLEAR CLEAR   Specific Gravity, Urine 1.005 1.005 - 1.030   pH 7.0 5.0 - 8.0   Glucose, UA NEGATIVE NEGATIVE mg/dL   Hgb urine dipstick NEGATIVE NEGATIVE   Bilirubin Urine NEGATIVE NEGATIVE   Ketones, ur NEGATIVE NEGATIVE mg/dL   Protein, ur NEGATIVE NEGATIVE mg/dL   Nitrite NEGATIVE NEGATIVE   Leukocytes, UA NEGATIVE NEGATIVE   RBC / HPF 0-5 0 - 5 RBC/hpf   WBC, UA 0-5 0 - 5 WBC/hpf   Bacteria, UA NONE SEEN NONE SEEN   Squamous Epithelial / LPF 0-5 (A) NONE SEEN    Comment: Performed at [facility], [place] [address], [place], [place] [address]  Urine culture     Status: None   Collection Time: 05/10/17  5:16 PM  Result Value Ref Range   Specimen Description      URINE, RANDOM Performed at [place] [address] [place], [place] [address]    Special Requests      NONE Performed at [facility], [place] [address], [place], [place] [address]    Culture      NO GROWTH Performed at [facility],  [place] [address] [place], [place] [address]    Report Status 05/12/2017 FINAL   RPR     Status: None   Collection Time: 05/10/17  6:12 PM  Result Value Ref Range   RPR Ser Ql Non Reactive Non Reactive    Comment: (NOTE) Performed At: [facility] [place], [place] [id number] Lindon Romp MD IN:[id number] Performed at [facility], [place] [address] [place], [place] [address]   HIV antibody     Status: None   Collection Time: 05/10/17  6:12 PM  Result Value Ref Range   HIV Screen 4th Generation wRfx Non Reactive Non Reactive    Comment: (NOTE) Performed At: [facility] [place], [place] [id number] Lindon Romp MD YY:[id number] Performed at [facility], [place] [address] [place], [place] [address]   CBC with Differential/Platelet     Status: None   Collection Time: 05/10/17  6:12 PM  Result Value Ref Range   WBC 9.4 4.0 - 10.5 K/uL   RBC 4.42 3.87 - 5.11 MIL/uL   Hemoglobin 13.8 12.0 - 15.0 g/dL   HCT 40.5 36.0 - 46.0 %   MCV 91.6 78.0 - 100.0 fL   MCH 31.2 26.0 - 34.0 pg   MCHC 34.1 30.0 - 36.0 g/dL   RDW 13.1 11.5 - 15.5 %   Platelets 265 150 - 400 K/uL   Neutrophils Relative % 66 %   Neutro Abs 6.2 1.7 - 7.7 K/uL   Lymphocytes Relative 25 %   Lymphs Abs 2.3  0.7 - 4.0 K/uL   Monocytes Relative 7 %   Monocytes Absolute 0.7 0.1 - 1.0 K/uL   Eosinophils Relative 2 %   Eosinophils Absolute 0.2 0.0 - 0.7 K/uL   Basophils Relative 0 %   Basophils Absolute 0.0 0.0 - 0.1 K/uL    Comment: Performed at [facility], [place] [address], [place], [place] [address]   Blood Alcohol level:  Lab Results  Component Value Date   [facility] <5 05/04/2017   ETH <5 86/76/7209   Metabolic Disorder Labs: Lab Results  Component Value Date   HGBA1C 5.2 03/24/2017   MPG 103 03/24/2017   No results found for: PROLACTIN Lab Results  Component Value Date   CHOL 173 03/24/2017   TRIG 139  03/24/2017   HDL 53 03/24/2017   CHOLHDL 3.3 03/24/2017   VLDL 28 03/24/2017   LDLCALC 92 03/24/2017    Physical Findings: AIMS: Facial and Oral Movements Muscles of Facial Expression: None, normal Lips and Perioral Area: None, normal Jaw: None, normal Tongue: None, normal,Extremity Movements Upper (arms, wrists, hands, fingers): None, normal Lower (legs, knees, ankles, toes): None, normal, Trunk Movements Neck, shoulders, hips: None, normal, Overall Severity Severity of abnormal movements (highest score from questions above): None, normal Incapacitation due to abnormal movements: None, normal Patient's awareness of abnormal movements (rate only patient's report): No Awareness, Dental Status Current problems with teeth and/or dentures?: Yes Does patient usually wear dentures?: No  CIWA:    COWS:  COWS Total Score: 0  Musculoskeletal: Strength & Muscle Tone: within normal limits Gait & Station: normal Patient leans: N/A  Psychiatric Specialty Exam: Physical Exam  Nursing note and vitals reviewed. Constitutional: She is oriented to person, place, and time.  Cardiovascular: Normal rate.   Neurological: She is alert and oriented to person, place, and time.  Psychiatric: She has a normal mood and affect. Her behavior is normal.  : Nurse's noted & Vital signs reviewed.  Review of Systems  Genitourinary: Positive for dysuria.       Vaginal discharge odor  Psychiatric/Behavioral: Positive for depression, substance abuse and suicidal ideas.  decreased RLQ pain today, no hematuria .   Blood pressure (!) 139/91, pulse 85, temperature 98.2 F (36.8 C), temperature source Oral, resp. rate 16, height '5\' 4"'  (1.626 m), weight 62.6 kg (138 lb), SpO2 98 %.Body mass index is 23.69 kg/m.  General Appearance: well groomed  Eye Contact:   Good   Speech:  Normal   Volume:  Normal   Mood: mood presents improved compared to admission   Affect:  at this time improved , more reactive,  episodes of increased anxiety   Thought Process: Linear, associations intact   Orientation: fully alert, attentive   Thought Content: no psychotic symptoms , does not appear internally preoccupied   Suicidal Thoughts:denies any active suicidal or self injurious ideations  Homicidal Thoughts:   No- denies violent ideations  Memory:   recent and remote grossly intact   Judgement:  fair- improving   Insight:  fair- improving   Psychomotor Activity:  Normal  Concentration:  Concentration: good  and Attention Span: good   Recall:  good   Fund of Knowledge:  good   Language:  good   Akathisia:  no   Handed:  Right  AIMS (if indicated):     Assets: resilience, desire for improvement  ADL's:  Intact   Cognition:  WNL   Sleep:  Number of Hours: 8     Assessment -  At this time patient presents calmer , with more reactive affect and is , with reassurance, support, and review of normal lab findings, less severely anxious, and less somatically focused . Work up did reveal, via abdominal x ray, R non obstructive renal calculi, but UA is negative for microscopic hematuria.   Will continue today 05/07/17 plan as below except where it is noted.  Treatment Plan Summary: Encourage group and milieu participation to work on coping skills and symptom reduction Continue Tegretol 200 mgrs QDAY for mood disorder  Continue Valium 5 mgrs BID PRN for anxiety as needed  Decrease Soma to 175 mgrs QDAY- see above  Continue MS Contin 15 mgrs BID for pain  Continue Effexor XR 150 mgrs QAM for depression, anxiety Continue  Neurontin600 mgrs TID for pain and anxiety  Continue Minipress 2 mgrs QHS for nightmares  Treatment team working on disposition planning options.  Katelyn Galloway, PMHNP, FNP-BC Patient ID: Katelyn Galloway, female   DOB: [birth year], 56 y.o.   MRN: [id number]

## 2017-05-12 NOTE — [facility] Group Notes (Signed)
[facility] LCSW Group Therapy 05/12/2017 1:15 PM  Type of Therapy: Group Therapy- Emotion Regulation  Participation Level: Active   Participation Quality:  Appropriate  Affect: Appropriate  Cognitive: Alert and Oriented   Insight:  Developing/Improving  Engagement in Therapy: Developing/Improving and Engaged   Modes of Intervention: Clarification, Confrontation, Discussion, Education, Exploration, Limit-setting, Orientation, Problem-solving, Rapport Building, Dance movement psychotherapisteality Testing, Socialization and Support  Summary of Progress/Problems: The topic for group today was emotional regulation. This group focused on both positive and negative emotion identification and allowed group members to process ways to identify feelings, regulate negative emotions, and find healthy ways to manage internal/external emotions. Group members were asked to reflect on a time when their reaction to an emotion led to a negative outcome and explored how alternative responses using emotion regulation would have benefited them. Group members were also asked to discuss a time when emotion regulation was utilized when a negative emotion was experienced.   Vernie ShanksLauren Dontrez Pettis, LCSW 05/12/2017 5:43 PM

## 2017-05-12 NOTE — Progress Notes (Signed)
D: Patient continues to complain of LLQ pain due to possible kidney stones.  Patient states that pain is bother her "more so than her back."  She continues to report thoughts of self harm.  Her affect is brighter today and she is flirtatious and intrusive at times.  She is complaining of diarrhea and cramping.  She reports her depression and hopelessness as a 7; anxiety as a 6.  She continues to refuse her soma and it needs to be discontinued.  Patient states, "I don't want it now, but I don't know what I'll do when I get home."  She denies HI and is not responding to internal stimuli. A: Continue to monitor medication management and MD orders.  Safety checks completed every 15 minutes per protocol.  R: Patient is receptive to staff; her behavior is appropriate.

## 2017-05-13 MED ORDER — DIAZEPAM 2 MG PO TABS
2.0000 mg | ORAL_TABLET | Freq: Two times a day (BID) | ORAL | Status: DC | PRN
  Administered 2017-05-13 – 2017-05-14 (×2): 2 mg via ORAL
  Filled 2017-05-13 (×3): qty 1

## 2017-05-13 NOTE — Progress Notes (Signed)
Pt attend wrap up group. 

## 2017-05-13 NOTE — Progress Notes (Signed)
[facility] MD Progress Note  05/13/2017 4:11 PM Katelyn Galloway  MRN:  [id number]  Subjective: Katelyn Galloway reports, "I'm okay, but, I heard this high pitched noise this morning, did not know what to make of it. I dreamt of Katelyn Galloway last night & woke up with panic attacks. I cried some, rocked forth & back, then I said, what a minute, I can use my coping techniques that I learned. And I did, now I feel better".  Objective:  I have discussed case with treatment team and have met with patient. She present with an appropriate affect, borderline-like mannerisms. Patient presents partially improved compared to admission- currently appears calm, with reactive , appropriate affect, but as discussed with nursing staff, says had episodes of panic attacks this morning upon waking up from dreaming about her boyfriend - Katelyn Galloway. She says was able to use her coping skills adequately. She had reported RLQ discomfort previously- at this time does not appear to be in any acute distress or discomfort. Abdominal X ray did demonstrate non obstructing R renal calculi, and patient states she has a history of kidney stones, likely to follow-up care with her primary care provider after discharge. Of note UA is clear- no microscopic hematuria. Denies medication side effects.  No disruptive behaviors on unit. Has tolerated Soma taper well, without increase in pain or discomfort .   Principal Problem: Bipolar disorder with current episode depressed (HCC)  Diagnosis:   Patient Active Problem List   Diagnosis Date Noted  . Bipolar disorder with current episode depressed (HCC) [F31.30] 05/05/2017  . 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 Asthma [J45.909] 03/24/2017  . Tobacco use disorder [F17.200] 03/24/2017  . Cannabis abuse [F12.10] 03/23/2017  . Hypothyroidism [E03.9] 05/15/2015  . GAD (generalized anxiety disorder) [F41.1] 07/10/2010  . Chronic bilateral low back pain [M54.5, G89.29] 07/09/2009    Total Time spent with patient: 15 minutes  Past Psychiatric History: Bipolar disorder, depressed, Substance use disorder.  Past Medical History:  Past Medical History:  Diagnosis Date  . Anemia 1970  . Anxiety   . Arthritis   . Asthma    child  . Bronchitis    "years ago"  . Bronchitis    chronic hx  . Chronic back pain    hx of  . Depression   . Fibromyalgia   . GERD (gastroesophageal reflux disease)    occ  . Headache(784.0)    migraines  . Heart palpitations    occ if get anxious- no tests  . History of kidney stones   . Hypothyroidism   . Irritable bowel syndrome (IBS)   . Seizures (HCC)    x 2 during pregnancy per pt was not diagnosed with eclampsia    Past Surgical History:  Procedure Laterality Date  . ABDOMINAL HYSTERECTOMY  1992  . BACK SURGERY     x2  . CHOLECYSTECTOMY  2009 or 2010  . HARDWARE REMOVAL  12/29/2012   Procedure: HARDWARE REMOVAL;  Surgeon: David S Jones, MD;  Location: [facility];  Service: Neurosurgery;  Laterality: N/A;  Lumbar hardware extraction  . kindey stone removal    . KNEE ARTHROSCOPY     right knee  . SPINAL CORD STIMULATOR INSERTION N/A 01/25/2015   Procedure: LUMBAR SPINAL CORD STIMULATOR INSERTION;  Surgeon: Paul C Harkins, MD;  Location: [facility];  Service: Neurosurgery;  Laterality: N/A;  . TUBAL LIGATION     Family History:  Family History  Problem Relation Age of   Onset  . Depression Mother   . Heart attack Mother   . Suicidality Father   . Heart disease Father   . Anesthesia problems Neg Hx    Family Psychiatric  History: See H&P  Social History:  History  Alcohol Use  . 0.6 oz/week  . 1 Glasses of wine per week     History  Drug Use  . Types: Marijuana    Social History   Social History  . Marital status: Divorced    Spouse name: N/A  . Number of children: N/A  . Years of education: N/A   Social History Main Topics  . Smoking status: Current Some Day Smoker    Packs/day: 0.50    Years:  38.00    Types: Cigarettes  . Smokeless tobacco: Never Used     Comment: Pt reports using an electric cigarette ~ 2 times a day  . Alcohol use 0.6 oz/week    1 Glasses of wine per week  . Drug use: Yes    Types: Marijuana  . Sexual activity: Not Currently    Birth control/ protection: Surgical   Other Topics Concern  . None   Social History Narrative  . None   Additional Social History:    Pain Medications: See home med list Prescriptions: See home med list Over the Counter: See home med list History of alcohol / drug use?: No history of alcohol / drug abuse Name of Substance 1: THC 1 - Age of First Use: 14 1 - Amount (size/oz): "1/2 to one joint" 1 - Frequency: daily for pain 1 - Duration: ongoing since 1999 1 - Last Use / Amount: 05/03/17  Sleep: Good  Appetite:  Good  Current Medications: Current Facility-Administered Medications  Medication Dose Route Frequency Provider Last Rate Last Dose  . acetaminophen (TYLENOL) tablet 650 mg  650 mg Oral Q6H PRN Laverle Hobby, PA-C   650 mg at 05/08/17 1151  . albuterol (PROVENTIL HFA;VENTOLIN HFA) 108 (90 Base) MCG/ACT inhaler 1-2 puff  1-2 puff Inhalation Q6H PRN Laverle Hobby, PA-C      . alum & mag hydroxide-simeth (MAALOX/MYLANTA) 200-200-20 MG/5ML suspension 30 mL  30 mL Oral Q4H PRN Patriciaann Clan E, PA-C   30 mL at 05/12/17 0837  . carbamazepine (TEGRETOL) tablet 200 mg  200 mg Oral QHS Patriciaann Clan E, PA-C   200 mg at 05/12/17 2313  . diazepam (VALIUM) tablet 5 mg  5 mg Oral BID PRN Ursula Alert, MD   5 mg at 05/11/17 0804  . docusate sodium (COLACE) capsule 100 mg  100 mg Oral Daily Derrill Center, NP   100 mg at 05/13/17 0820  . gabapentin (NEURONTIN) capsule 600 mg  600 mg Oral TID Cobos, Myer Peer, MD   600 mg at 05/13/17 1205  . ibuprofen (ADVIL,MOTRIN) tablet 600 mg  600 mg Oral Q6H PRN Derrill Center, NP   600 mg at 05/13/17 0105  . levothyroxine (SYNTHROID, LEVOTHROID) tablet 25 mcg  25 mcg Oral QAC  breakfast Laverle Hobby, PA-C   25 mcg at 05/13/17 9935  . lidocaine (LIDODERM) 5 % 1 patch  1 patch Transdermal Q24H Laverle Hobby, PA-C   1 patch at 05/13/17 7017  . magnesium hydroxide (MILK OF MAGNESIA) suspension 30 mL  30 mL Oral Daily PRN Patriciaann Clan E, PA-C      . morphine (MS CONTIN) 12 hr tablet 15 mg  15 mg Oral Q12H Encarnacion Slates, NP  15 mg at 05/13/17 0821  . nicotine (NICODERM CQ - dosed in mg/24 hours) patch 21 mg  21 mg Transdermal Daily Patriciaann Clan E, PA-C   21 mg at 05/13/17 1275  . polyethylene glycol (MIRALAX / GLYCOLAX) packet 17 g  17 g Oral Daily PRN Laverle Hobby, PA-C   17 g at 05/05/17 2135  . prazosin (MINIPRESS) capsule 2 mg  2 mg Oral QHS Patriciaann Clan E, PA-C   2 mg at 05/12/17 2312  . venlafaxine XR (EFFEXOR-XR) 24 hr capsule 150 mg  150 mg Oral Q breakfast Laverle Hobby, PA-C   150 mg at 05/13/17 1700    Lab Results:  No results found for this or any previous visit (from the past 48 hour(s)). Blood Alcohol level:  Lab Results  Component Value Date   ETH <5 05/04/2017   ETH <5 17/49/4496   Metabolic Disorder Labs: Lab Results  Component Value Date   HGBA1C 5.2 03/24/2017   MPG 103 03/24/2017   No results found for: PROLACTIN Lab Results  Component Value Date   CHOL 173 03/24/2017   TRIG 139 03/24/2017   HDL 53 03/24/2017   CHOLHDL 3.3 03/24/2017   VLDL 28 03/24/2017   LDLCALC 92 03/24/2017    Physical Findings: AIMS: Facial and Oral Movements Muscles of Facial Expression: None, normal Lips and Perioral Area: None, normal Jaw: None, normal Tongue: None, normal,Extremity Movements Upper (arms, wrists, hands, fingers): None, normal Lower (legs, knees, ankles, toes): None, normal, Trunk Movements Neck, shoulders, hips: None, normal, Overall Severity Severity of abnormal movements (highest score from questions above): None, normal Incapacitation due to abnormal movements: None, normal Patient's awareness of abnormal  movements (rate only patient's report): No Awareness, Dental Status Current problems with teeth and/or dentures?: Yes Does patient usually wear dentures?: No  CIWA:    COWS:  COWS Total Score: 0  Musculoskeletal: Strength & Muscle Tone: within normal limits Gait & Station: normal Patient leans: N/A  Psychiatric Specialty Exam: Physical Exam  Nursing note and vitals reviewed. Constitutional: She is oriented to person, place, and time.  Cardiovascular: Normal rate.   Neurological: She is alert and oriented to person, place, and time.  Psychiatric: She has a normal mood and affect. Her behavior is normal.  : Nurse's noted & Vital signs reviewed.  Review of Systems  Genitourinary: Positive for dysuria.       Vaginal discharge odor  Psychiatric/Behavioral: Positive for depression, substance abuse and suicidal ideas.  decreased RLQ pain today, no hematuria .   Blood pressure (!) 140/97, pulse 93, temperature 98.4 F (36.9 C), temperature source Oral, resp. rate 16, height 5' 4" (1.626 m), weight 62.6 kg (138 lb), SpO2 98 %.Body mass index is 23.69 kg/m.  General Appearance: well groomed  Eye Contact:   Good   Speech:  Normal   Volume:  Normal   Mood: mood presents improved compared to admission   Affect:  at this time improved , more reactive, episodes of increased anxiety   Thought Process: Linear, associations intact   Orientation: fully alert, attentive   Thought Content: no psychotic symptoms , does not appear internally preoccupied   Suicidal Thoughts:denies any active suicidal or self injurious ideations  Homicidal Thoughts:   No- denies violent ideations  Memory:   recent and remote grossly intact   Judgement:  fair- improving   Insight:  fair- improving   Psychomotor Activity:  Normal  Concentration:  Concentration: good  and Attention Span: good  Recall:  good   Fund of Knowledge:  good   Language:  good   Akathisia:  no   Handed:  Right  AIMS (if indicated):      Assets: resilience, desire for improvement  ADL's:  Intact   Cognition:  WNL   Sleep:  Number of Hours: 8     Assessment - At this time patient presents calmer , with more reactive affect and is , with reassurance, support, and review of normal lab findings, less severely anxious, and less somatically focused . Work up did reveal, via abdominal x ray, R non obstructive renal calculi, but UA is negative for microscopic hematuria.   Will continue today 05/14/17 plan as below except where it is noted.  Treatment Plan Summary: Encourage group and milieu participation to work on coping skills and symptom reduction Continue Tegretol 200 mgrs QDAY for mood disorder  Continue Valium 5 mgrs BID PRN for anxiety as needed  ContinueSoma to 175 mgrs QDAY- see above  Continue MS Contin 15 mgrs BID for pain  Continue Effexor XR 150 mgrs QAM for depression, anxiety Continue  Neurontin600 mgrs TID for pain and anxiety  Continue Minipress 2 mgrs QHS for nightmares  Treatment team working on disposition planning options.   I. , PMHNP, FNP-BC Patient ID: Lynetta G Tabares, female   DOB: 06/23/1961, 56 y.o.   MRN: [id number]  

## 2017-05-13 NOTE — Progress Notes (Signed)
D: When asked about her day pt stated, "today was a 10". Stated she "mastered the elevator to go outside". Pt informed the writer that her husband visited and she realized "that absence makes the heart grow founder". Pt has no questions or concerns.    A:  Support and encouragement was offered. 15 min checks continued for safety.  R: Pt remains safe.

## 2017-05-13 NOTE — [facility] Group Notes (Signed)
[facility] Group Notes: Different Perspectives   Date:  05/13/2017  Time:  4:28 PM  Type of Therapy:  Psychoeducational Skills  Participation Level:  Active  Participation Quality:  Attentive, Monopolizing, Redirectable and Sharing  Affect:  Anxious  Cognitive:  Appropriate  Insight:  Appropriate  Engagement in Group:  Engaged  Modes of Intervention:  Activity, Discussion and Education  Summary of Progress/Problems: Patient attended group and was engaged in Perspectives activity. She did tend to monopolize when speaking at times but she was easily redirected.   Marzetta BoardDopson, Toma Arts E 05/13/2017, 4:28 PM

## 2017-05-13 NOTE — Progress Notes (Signed)
Patient attended group and said that her day was a 10.  She was excited because she went outside and her husband also visited her today.

## 2017-05-13 NOTE — Progress Notes (Signed)
Patient ID: Katelyn Galloway, female   DOB: 12/22/1960, 56 y.o.   MRN: [id number]  Pt currently presents with a flat affect and anxious, impulsive behavior. Pt reports increasing tinnitus in both ears, keeps earplugs in ears while on unit. Pt attends karaoke group tonight. Reports chronic pain 8/10. Mood is labile, seen smiling before group and then covering face and frowning during medication administration. Pt reports intermittent sleep with current medication regimen. States "Oh My God!" and throws medicine cup down on table when given Vallum 2 mg. Refuses to discuss actions with Clinical research associatewriter.  Pt provided with medications per providers orders. Pt's labs and vitals were monitored throughout the night. Pt given a 1:1 about emotional and mental status. Pt supported and encouraged to express concerns and questions. Pt educated on medications and dosage.   Pt's safety ensured with 15 minute and environmental checks. Pt currently endorses passive SI, plan to overdose on medications if at home. Reports that her husband would be able to keep her medications to resist the temptation to take them. Endorses HI and A/V hallucinations. Pt verbally agrees to seek staff if SI worsens, HI or A/VH occurs and to consult with staff before acting on any harmful thoughts. Will continue POC.

## 2017-05-13 NOTE — [facility] Group Notes (Signed)
[facility] LCSW Group Therapy 05/13/2017 1:15pm  Type of Therapy: Group Therapy- Balance in Life  Participation Level: Minimal but Attentive  Description of the Group:  The topic for group was balance in life. Today's group focused on defining balance in one's own words, identifying things that can knock one off balance, and exploring healthy ways to maintain balance in life. Group members were asked to provide an example of a time when they felt off balance, describe how they handled that situation,and process healthier ways to regain balance in the future. Group members were asked to share the most important tool for maintaining balance that they [facility] and how they plan to apply this method after discharge.  Summary of Patient Progress Pt was reserved in group discussion but did raise her hand to indicate that she feels that her life is unbalanced.    Therapeutic Modalities:   Cognitive Behavioral Therapy Solution-Focused Therapy Assertiveness Training   Vernie ShanksLauren Kyree Adriano, LCSW 05/13/2017 2:33 PM

## 2017-05-13 NOTE — Progress Notes (Signed)
D: Patient is animated with bright, smiling affect.  She is intrusive with staff and peers at times.  Patient feels good because she can ride the elevator downstairs now.  She states, "I was mentally preparing myself for it yesterday."  Patient also states, "I was able to do extensive yoga."  She continues to complain of chronic back and leg pain.  She has passive thoughts of self harm without a specific plan.  Patient rates his depression as a 7; hopelessness as a 5; anxiety as a 9.  She is also complaining of "ringing in my ears."  Patient plans to work on her "codependency" today.   A: Continue to monitor medication management and MD orders.  Safety checks completed every 15 minutes per protocol.  Offer support and encouragement as needed. R: Patient is receptive to staff; her behavior is appropriate.

## 2017-05-14 MED ORDER — DIAZEPAM 2 MG PO TABS
ORAL_TABLET | ORAL | Status: AC
  Administered 2017-05-14: 2 mg via ORAL
  Filled 2017-05-14: qty 1

## 2017-05-14 MED ORDER — DIAZEPAM 2 MG PO TABS
2.0000 mg | ORAL_TABLET | Freq: Once | ORAL | Status: AC
  Administered 2017-05-14: 2 mg via ORAL

## 2017-05-14 MED ORDER — DIAZEPAM 5 MG PO TABS
5.0000 mg | ORAL_TABLET | Freq: Two times a day (BID) | ORAL | Status: DC | PRN
  Administered 2017-05-14 – 2017-05-21 (×11): 5 mg via ORAL
  Filled 2017-05-14 (×11): qty 1

## 2017-05-14 NOTE — Progress Notes (Signed)
D.  Pt anxious and upset on approach, complaint of Soma being discontinued and muscle spasms.  Pt states that when she sees the flickering florescent lights it makes her have involuntary neck and face ticks.  She also reports that her sense of smell is becoming much more acute.  Pt states this is new for her.  Pt did not feel well enough to attend evening wrap up group.  Peers on unit sent a handwritten note to charge RN about Pt stating they want her off the hall as she is disruptive to the milieu and interfering with their ability to program. Pt became very upset that peers did not want to watch TV show that she was watching, peers felt it was upsetting as it dealt with murder.  Pt wanted to go into a room by herself and watch TV.   A.  Support and encouragement offered to Pt, medication given as ordered.  Explained to Pt that there is no room where she can watch TV alone.    R.  Pt remains safe on the unit, will continue to monitor.

## 2017-05-14 NOTE — Progress Notes (Signed)
[facility] MD Progress Note  05/14/2017 1:33 PM Katelyn Galloway  MRN:  [id number]  Subjective: patient reports she feels tired and sore today following an episode of muscle spasm, affecting mostly upper body, shoulders area early this morning  She states she is feeling better today, and currently is in no acute distress or discomfort . Reports ongoing depression, but states there has been some improvement , and in particular, states she feels she is strengthening her relationship with her spouse . Expresses concern about medications that have been tapered , states " this morning when I had that episode, I needed Soma and Valium", and states episode was more severe due to not being on same dose of Soma. She does, however, express understanding for the rationale to taper doses of potentially sedating medications, particularly as on opiate analgesics .  Objective:  I have discussed case with treatment team and have met with patient.  Patient presents alert, attentive. Reports gradually improving mood, but still feels depressed . As reviewed above, states she had an increased muscle tightness, spasm- it subsided after a period of time, but patient reports she feels it was severe due to not being on same dose of Soma, Valium. Currently she does not appear to be in any acute distress or discomfort. Denies any current suicidal plan or intention. No disruptive or agitated behaviors on unit.  Principal Problem: Bipolar disorder with current episode depressed ([place])  Diagnosis:   Patient Active Problem List   Diagnosis Date Noted  . Bipolar disorder with current episode depressed ([place]) [F31.30] 05/05/2017  . 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 Asthma [J45.909] 03/24/2017  . Tobacco use disorder [F17.200] 03/24/2017  . Cannabis abuse [F12.10] 03/23/2017  . Hypothyroidism [E03.9] 05/15/2015  . GAD (generalized anxiety disorder) [F41.1] 07/10/2010  . Chronic bilateral low  back pain [M54.5, G89.29] 07/09/2009   Total Time spent with patient: 20 minutes  Past Psychiatric History: Bipolar disorder, depressed, Substance use disorder.  Past Medical History:  Past Medical History:  Diagnosis Date  . Anemia 1970  . Anxiety   . Arthritis   . Asthma    child  . Bronchitis    "years ago"  . Bronchitis    chronic hx  . Chronic back pain    hx of  . Depression   . Fibromyalgia   . GERD (gastroesophageal reflux disease)    occ  . Headache(784.0)    migraines  . Heart palpitations    occ if get anxious- no tests  . History of kidney stones   . Hypothyroidism   . Irritable bowel syndrome (IBS)   . Seizures ([place])    x 2 during pregnancy per pt was not diagnosed with eclampsia    Past Surgical History:  Procedure Laterality Date  . ABDOMINAL HYSTERECTOMY  1992  . BACK SURGERY     x2  . CHOLECYSTECTOMY  2009 or 2010  . HARDWARE REMOVAL  12/29/2012   Procedure: HARDWARE REMOVAL;  Surgeon: Eustace Moore, MD;  Location: [facility];  Service: Neurosurgery;  Laterality: N/A;  Lumbar hardware extraction  . kindey stone removal    . KNEE ARTHROSCOPY     right knee  . SPINAL CORD STIMULATOR INSERTION N/A 01/25/2015   Procedure: LUMBAR SPINAL CORD STIMULATOR INSERTION;  Surgeon: Bonna Gains, MD;  Location: [facility];  Service: Neurosurgery;  Laterality: N/A;  . TUBAL LIGATION     Family History:  Family History  Problem Relation  Age of Onset  . Depression Mother   . Heart attack Mother   . Suicidality Father   . Heart disease Father   . Anesthesia problems Neg Hx    Family Psychiatric  History: See H&P  Social History:  History  Alcohol Use  . 0.6 oz/week  . 1 Glasses of wine per week     History  Drug Use  . Types: Marijuana    Social History   Social History  . Marital status: Divorced    Spouse name: N/A  . Number of children: N/A  . Years of education: N/A   Social History Main Topics  . Smoking status: Current Some Day  Smoker    Packs/day: 0.50    Years: 38.00    Types: Cigarettes  . Smokeless tobacco: Never Used     Comment: Pt reports using an electric cigarette ~ 2 times a day  . Alcohol use 0.6 oz/week    1 Glasses of wine per week  . Drug use: Yes    Types: Marijuana  . Sexual activity: Not Currently    Birth control/ protection: Surgical   Other Topics Concern  . None   Social History Narrative  . None   Additional Social History:    Pain Medications: See home med list Prescriptions: See home med list Over the Counter: See home med list History of alcohol / drug use?: No history of alcohol / drug abuse Name of Substance 1: THC 1 - Age of First Use: 14 1 - Amount (size/oz): "1/2 to one joint" 1 - Frequency: daily for pain 1 - Duration: ongoing since 1999 1 - Last Use / Amount: 05/03/17  Sleep: Fair  Appetite:  Good  Current Medications: Current Facility-Administered Medications  Medication Dose Route Frequency Provider Last Rate Last Dose  . acetaminophen (TYLENOL) tablet 650 mg  650 mg Oral Q6H PRN Laverle Hobby, PA-C   650 mg at 05/08/17 1151  . albuterol (PROVENTIL HFA;VENTOLIN HFA) 108 (90 Base) MCG/ACT inhaler 1-2 puff  1-2 puff Inhalation Q6H PRN Laverle Hobby, PA-C      . alum & mag hydroxide-simeth (MAALOX/MYLANTA) 200-200-20 MG/5ML suspension 30 mL  30 mL Oral Q4H PRN Patriciaann Clan E, PA-C   30 mL at 05/12/17 0837  . carbamazepine (TEGRETOL) tablet 200 mg  200 mg Oral QHS Patriciaann Clan E, PA-C   200 mg at 05/13/17 2306  . diazepam (VALIUM) tablet 2 mg  2 mg Oral BID PRN Artist Beach, MD   2 mg at 05/14/17 0641  . docusate sodium (COLACE) capsule 100 mg  100 mg Oral Daily Derrill Center, NP   100 mg at 05/14/17 0973  . gabapentin (NEURONTIN) capsule 600 mg  600 mg Oral TID Cobos, Myer Peer, MD   600 mg at 05/14/17 0813  . ibuprofen (ADVIL,MOTRIN) tablet 600 mg  600 mg Oral Q6H PRN Derrill Center, NP   600 mg at 05/13/17 0105  . levothyroxine (SYNTHROID,  LEVOTHROID) tablet 25 mcg  25 mcg Oral QAC breakfast Laverle Hobby, PA-C   25 mcg at 05/14/17 5329  . lidocaine (LIDODERM) 5 % 1 patch  1 patch Transdermal Q24H Laverle Hobby, PA-C   1 patch at 05/14/17 1036  . magnesium hydroxide (MILK OF MAGNESIA) suspension 30 mL  30 mL Oral Daily PRN Patriciaann Clan E, PA-C      . morphine (MS CONTIN) 12 hr tablet 15 mg  15 mg Oral Q12H Lindell Spar  I, NP   15 mg at 05/14/17 0815  . nicotine (NICODERM CQ - dosed in mg/24 hours) patch 21 mg  21 mg Transdermal Daily Patriciaann Clan E, PA-C   21 mg at 05/14/17 0813  . polyethylene glycol (MIRALAX / GLYCOLAX) packet 17 g  17 g Oral Daily PRN Laverle Hobby, PA-C   17 g at 05/05/17 2135  . prazosin (MINIPRESS) capsule 2 mg  2 mg Oral QHS Patriciaann Clan E, PA-C   2 mg at 05/13/17 2305  . venlafaxine XR (EFFEXOR-XR) 24 hr capsule 150 mg  150 mg Oral Q breakfast Laverle Hobby, PA-C   150 mg at 05/14/17 5053    Lab Results:  No results found for this or any previous visit (from the past 48 hour(s)). Blood Alcohol level:  Lab Results  Component Value Date   ETH <5 05/04/2017   ETH <5 97/67/3419   Metabolic Disorder Labs: Lab Results  Component Value Date   HGBA1C 5.2 03/24/2017   MPG 103 03/24/2017   No results found for: PROLACTIN Lab Results  Component Value Date   CHOL 173 03/24/2017   TRIG 139 03/24/2017   HDL 53 03/24/2017   CHOLHDL 3.3 03/24/2017   VLDL 28 03/24/2017   LDLCALC 92 03/24/2017    Physical Findings: AIMS: Facial and Oral Movements Muscles of Facial Expression: None, normal Lips and Perioral Area: None, normal Jaw: None, normal Tongue: None, normal,Extremity Movements Upper (arms, wrists, hands, fingers): None, normal Lower (legs, knees, ankles, toes): None, normal, Trunk Movements Neck, shoulders, hips: None, normal, Overall Severity Severity of abnormal movements (highest score from questions above): None, normal Incapacitation due to abnormal movements: None,  normal Patient's awareness of abnormal movements (rate only patient's report): No Awareness, Dental Status Current problems with teeth and/or dentures?: Yes Does patient usually wear dentures?: No  CIWA:    COWS:  COWS Total Score: 0  Musculoskeletal: Strength & Muscle Tone: within normal limits Gait & Station: normal Patient leans: N/A  Psychiatric Specialty Exam: Physical Exam  Nursing note and vitals reviewed. Constitutional: She is oriented to person, place, and time.  Cardiovascular: Normal rate.   Neurological: She is alert and oriented to person, place, and time.  Psychiatric: She has a normal mood and affect. Her behavior is normal.  : Nurse's noted & Vital signs reviewed.  Review of Systems  Genitourinary: Positive for dysuria.       Vaginal discharge odor  Psychiatric/Behavioral: Positive for depression, substance abuse and suicidal ideas.  reports episode of muscle spasms earlier today, currently resolved    Blood pressure 130/83, pulse 97, temperature 98.4 F (36.9 C), temperature source Oral, resp. rate 16, height _0  (1.626 m), weight 62.6 kg (138 lb), SpO2 99 %.Body mass index is 23.69 kg/m.  General Appearance: well groomed   Eye Contact:   Good   Speech:  Normal   Volume:  Normal   Mood: reports lingering depression, but acknowledges improvement   Affect: mildly irritable, reactive, smiles at times appropriately   Thought Process: Linear, intact associations   Orientation: alert, attentive , oriented x 3    Thought Content: no hallucinations, no delusions, somatic concerns   Suicidal Thoughts:denies suicidal or self injurious ideations, currently contracts for safety on unit   Homicidal Thoughts:   denies   Memory:   recent and remote grossly intact   Judgement:  fair- improving   Insight:  fair- improving   Psychomotor Activity:  Decreased   Concentration:  Concentration:  good  and Attention Span: good   Recall:  good   Fund of Knowledge:  good    Language:  good   Akathisia:  no   Handed:  Right  AIMS (if indicated):     Assets: resilience, desire for improvement  ADL's:  Intact   Cognition:  WNL   Sleep:  Number of Hours: 8     Assessment - patient reports episode of muscle tension /cramping this AM. She states she has had these before, but normally they are not as severe as she takes [place] and Valium, with good effect. At this time cramps are resolved and does not present in any acute distress or discomfort . Denies active SI, and reports improving relationship with her SO. Does not, however, feel back to baseline, and reports ongoing anxiety, depression . Currently tolerating medications well . No sedation . She is now off [employer].   Treatment plan reviewed today 5/25 as below  Treatment Plan Summary: Encourage group and milieu participation to work on coping skills and symptom reduction Continue Tegretol 200 mgrs QDAY for mood disorder  Change  Valium to  5 mgrs BID PRN for anxiety as needed  Continue MS Contin 15 mgrs BID for pain  Continue Effexor XR 150 mgrs QAM for depression, anxiety Continue  Neurontin 600 mgrs TID for pain and anxiety  Continue Minipress 2 mgrs QHS for nightmares  Treatment team working on disposition planning options.  Katelyn Garnet, MD Patient ID: ALAYLAH HEATHERINGTON, female   DOB: 08-01-1961, 56 y.o.   MRN: [id number]

## 2017-05-14 NOTE — Progress Notes (Signed)
Patient ID: Katelyn Galloway, female   DOB: 03/09/1961, 56 y.o.   MRN: [id number]   Pt reports to writer that he muscles are cramping up because "I got real upset." Pt seen squeezing her bed sheets and holds her breath as she arches her back in bed. Pt reports that she needs "Vallum for my muscles, I told the doctor." Vitals assessed, at pt baseline. Provider notified, completed face to face assessment, see MAR. Writer stayed with pt during episode, responded will to active listening. Pt was able to relax her muscles and walk to the restroom. On the way back from the restroom patient squatted on the floor and began doing yoga poses and stretches on her toes. Pt escorted back to bed. Will continue to monitor.

## 2017-05-14 NOTE — [facility] Group Notes (Signed)
[facility] LCSW Group Therapy 05/14/2017 1:15pm  Type of Therapy: Group Therapy- Feelings Around Relapse and Recovery  Participation Level: Active   Participation Quality:  Appropriate  Affect:  Appropriate  Cognitive: Alert and Oriented   Insight:  Developing   Engagement in Therapy: Developing/Improving and Engaged   Modes of Intervention: Clarification, Confrontation, Discussion, Education, Exploration, Limit-setting, Orientation, Problem-solving, Rapport Building, Dance movement psychotherapisteality Testing, Socialization and Support  Summary of Progress/Problems: The topic for today was feelings about relapse. The group discussed what relapse prevention is to them and identified triggers that they are on the path to relapse. Members also processed their feeling towards relapse and were able to relate to common experiences. Group also discussed coping skills that can be used for relapse prevention. Pt was tearful during group and spoke a lot about the past. Pt states that her grandfather competed suicide years ago and it still bothers her.    Therapeutic Modalities:   Cognitive Behavioral Therapy Solution-Focused Therapy Assertiveness Training Relapse Prevention Therapy    Jonathon JordanLynn B Ralph Brouwer, MSW, Theresia MajorsLCSWA [phone] 05/14/2017 3:49 PM

## 2017-05-14 NOTE — Progress Notes (Signed)
Recreation Therapy Notes  Date: 05/14/17 Time: 0930 Location: [address] Dayroom  Group Topic: Stress Management  Goal Area(s) Addresses:  Patient will verbalize importance of using healthy stress management.  Patient will identify positive emotions associated with healthy stress management.   Intervention: Stress Management  Activity :  Gratitude Meditation.  LRT introduced the stress management technique of meditation to patients.  LRT played a meditation from the Calm app to allow patients to engage in the technique.  Patients were to follow along as the meditation played to participate in the practice.  Education:  Stress Management, Discharge Planning.   Education Outcome: Acknowledges edcuation/In group clarification offered/Needs additional education  Clinical Observations/Feedback: Pt did not attend group.   Caroll RancherMarjette Amerie Beaumont, LRT/CTRS         Caroll RancherLindsay, Rakel Junio A 05/14/2017 11:26 AM

## 2017-05-14 NOTE — Progress Notes (Signed)
Data. Patient denies HI/AVH. Patient continues to endorse SI, but can verbally contract for safety on the unit, and to come to staff if her SI becomes overwhelmiing, where she feels she may self harm. pqatient reports pain of 10/10 for most of the shift, even with medications and hot packs. Patient took a long bath to help, and did report some decreased in pain, allowing her to take a long mid morning nap. Patient has been tearful throughout the shift. She has been observed in multiple negative interactions with multiple peers through out the shift. Patient does not has any insight into howw her behavior affects these interactions and consistently reports that others are the cause of these negative interactions. Patient stated, "I just wish she (peer) would leave. She is so middle school. I could get this stress at home." patient also reported, "No one wants to talk to me today. I don't know what I have done to everyone." Patient then stated, "That young woman is so needy. She monopolizes all the attention and she talks about herself and her problems all the time." Patient tearful on and off throughout most of the shift. Sat in a chair in the day room, covered in a blanket and with her hood up. Action. Emotional support and encouragement offered. Education provided on medication, indications and side effect. Q 15 minute checks done for safety. Response. Safety on the unit maintained through 15 minute checks.  Medications taken as prescribed. Attended groups.

## 2017-05-14 NOTE — Progress Notes (Signed)
Pt did not attend group. 

## 2017-05-14 NOTE — Tx Team (Signed)
Interdisciplinary Treatment and Diagnostic Plan Update  05/14/2017 Time of Session: 9:30am Katelyn Galloway MRN: [id number]  Principal Diagnosis: Bipolar disorder with current episode depressed (HCC)  Secondary Diagnoses: Principal Problem:   Bipolar disorder with current episode depressed (HCC) Active Problems:   Chronic bilateral low back pain   GAD (generalized anxiety disorder)   Current Medications:  Current Facility-Administered Medications  Medication Dose Route Frequency Provider Last Rate Last Dose  . acetaminophen (TYLENOL) tablet 650 mg  650 mg Oral Q6H PRN Kerry Hough, PA-C   650 mg at 05/08/17 1151  . albuterol (PROVENTIL HFA;VENTOLIN HFA) 108 (90 Base) MCG/ACT inhaler 1-2 puff  1-2 puff Inhalation Q6H PRN Kerry Hough, PA-C      . alum & mag hydroxide-simeth (MAALOX/MYLANTA) 200-200-20 MG/5ML suspension 30 mL  30 mL Oral Q4H PRN Donell Sievert E, PA-C   30 mL at 05/12/17 0837  . carbamazepine (TEGRETOL) tablet 200 mg  200 mg Oral QHS Donell Sievert E, PA-C   200 mg at 05/13/17 2306  . diazepam (VALIUM) tablet 2 mg  2 mg Oral BID PRN Georgiann Cocker, MD   2 mg at 05/14/17 0641  . docusate sodium (COLACE) capsule 100 mg  100 mg Oral Daily Oneta Rack, NP   100 mg at 05/14/17 4098  . gabapentin (NEURONTIN) capsule 600 mg  600 mg Oral TID Cobos, Rockey Situ, MD   600 mg at 05/14/17 0813  . ibuprofen (ADVIL,MOTRIN) tablet 600 mg  600 mg Oral Q6H PRN Oneta Rack, NP   600 mg at 05/13/17 0105  . levothyroxine (SYNTHROID, LEVOTHROID) tablet 25 mcg  25 mcg Oral QAC breakfast Kerry Hough, PA-C   25 mcg at 05/14/17 1191  . lidocaine (LIDODERM) 5 % 1 patch  1 patch Transdermal Q24H Kerry Hough, PA-C   1 patch at 05/13/17 4782  . magnesium hydroxide (MILK OF MAGNESIA) suspension 30 mL  30 mL Oral Daily PRN Donell Sievert E, PA-C      . morphine (MS CONTIN) 12 hr tablet 15 mg  15 mg Oral Q12H Nwoko, Agnes I, NP   15 mg at 05/14/17 0815  . nicotine  (NICODERM CQ - dosed in mg/24 hours) patch 21 mg  21 mg Transdermal Daily Donell Sievert E, PA-C   21 mg at 05/14/17 0813  . polyethylene glycol (MIRALAX / GLYCOLAX) packet 17 g  17 g Oral Daily PRN Kerry Hough, PA-C   17 g at 05/05/17 2135  . prazosin (MINIPRESS) capsule 2 mg  2 mg Oral QHS Donell Sievert E, PA-C   2 mg at 05/13/17 2305  . venlafaxine XR (EFFEXOR-XR) 24 hr capsule 150 mg  150 mg Oral Q breakfast Kerry Hough, PA-C   150 mg at 05/14/17 9562    PTA Medications: Prescriptions Prior to Admission  Medication Sig Dispense Refill Last Dose  . albuterol (PROVENTIL HFA;VENTOLIN HFA) 108 (90 Base) MCG/ACT inhaler INHALE TWO PUFFS BY MOUTH EVERY 6 HOURS AS NEEDED FOR WHEEZING OR FOR SHORTNESS OF BREATH   Past Month at Unknown time  . carbamazepine (TEGRETOL) 200 MG tablet Take 200 mg by mouth at bedtime.    Past Week at Unknown time  . carisoprodol (SOMA) 350 MG tablet Take 0.5 tablets (175 mg total) by mouth 2 (two) times daily. spasm (Patient taking differently: Take 350 mg by mouth 2 (two) times daily. spasm) 1 tablet 0 05/04/2017 at Unknown time  . Cholecalciferol (VITAMIN D3) 5000 units CAPS Take  by mouth.   05/04/2017 at Unknown time  . diazepam (VALIUM) 5 MG tablet Take 1 tablet (5 mg total) by mouth every 12 (twelve) hours as needed for anxiety. (Patient taking differently: Take 10 mg by mouth every 12 (twelve) hours as needed for anxiety. ) 1 tablet 0 05/04/2017 at Unknown time  . gabapentin (NEURONTIN) 600 MG tablet Take 600 mg by mouth 3 (three) times daily.    05/04/2017 at Unknown time  . levothyroxine (SYNTHROID, LEVOTHROID) 25 MCG tablet Take 25 mcg by mouth daily before breakfast.    05/04/2017 at Unknown time  . lidocaine (LIDODERM) 5 % Place 1 patch onto the skin daily. Remove & Discard patch within 12 hours or as directed by MD 30 patch 0 Past Week at Unknown time  . lidocaine (LIDODERM) 5 % Place onto the skin.     . meloxicam (MOBIC) 15 MG tablet Take 15 mg by mouth  daily.    05/04/2017 at Unknown time  . Morphine Sulfate ER 15 MG TBEA Take 15 mg by mouth 2 (two) times daily as needed. Pain   05/04/2017 at Unknown time  . prazosin (MINIPRESS) 1 MG capsule Take 2 mg by mouth at bedtime.    Past Week at Unknown time  . prazosin (MINIPRESS) 1 MG capsule Take by mouth.     . venlafaxine XR (EFFEXOR-XR) 150 MG 24 hr capsule Take 1 capsule (150 mg total) by mouth daily with breakfast. 30 capsule 0 05/04/2017 at Unknown time  . venlafaxine XR (EFFEXOR-XR) 150 MG 24 hr capsule Take by mouth.     . Polyvinyl Alcohol-Povidone PF 1.4-0.6 % SOLN Apply to eye.       Treatment Modalities: Medication Management, Group therapy, Case management,  1 to 1 session with clinician, Psychoeducation, Recreational therapy.  Patient Stressors: Financial difficulties Health problems Marital or family conflict  Patient Strengths: Average or above average intelligence Capable of independent living General fund of knowledge Motivation for treatment/growth  Physician Treatment Plan for Primary Diagnosis: Bipolar disorder with current episode depressed (HCC) Long Term Goal(s): Improvement in symptoms so as ready for discharge  Short Term Goals: Ability to identify changes in lifestyle to reduce recurrence of condition will improve Ability to verbalize feelings will improve Ability to disclose and discuss suicidal ideas Ability to identify and develop effective coping behaviors will improve Compliance with prescribed medications will improve Ability to identify triggers associated with substance abuse/mental health issues will improve  Medication Management: Evaluate patient's response, side effects, and tolerance of medication regimen.  Therapeutic Interventions: 1 to 1 sessions, Unit Group sessions and Medication administration.  Evaluation of Outcomes: Progressing  Physician Treatment Plan for Secondary Diagnosis: Principal Problem:   Bipolar disorder with current episode  depressed (HCC) Active Problems:   Chronic bilateral low back pain   GAD (generalized anxiety disorder)   Long Term Goal(s): Improvement in symptoms so as ready for discharge  Short Term Goals: Ability to identify changes in lifestyle to reduce recurrence of condition will improve Ability to verbalize feelings will improve Ability to disclose and discuss suicidal ideas Ability to identify and develop effective coping behaviors will improve Compliance with prescribed medications will improve Ability to identify triggers associated with substance abuse/mental health issues will improve  Medication Management: Evaluate patient's response, side effects, and tolerance of medication regimen.  Therapeutic Interventions: 1 to 1 sessions, Unit Group sessions and Medication administration.  Evaluation of Outcomes: Progressing   RN Treatment Plan for Primary Diagnosis: Bipolar disorder with current  episode depressed (HCC) Long Term Goal(s): Knowledge of disease and therapeutic regimen to maintain health will improve  Short Term Goals: Ability to verbalize feelings will improve, Ability to disclose and discuss suicidal ideas and Ability to identify and develop effective coping behaviors will improve  Medication Management: RN will administer medications as ordered by provider, will assess and evaluate patient's response and provide education to patient for prescribed medication. RN will report any adverse and/or side effects to prescribing provider.  Therapeutic Interventions: 1 on 1 counseling sessions, Psychoeducation, Medication administration, Evaluate responses to treatment, Monitor vital signs and CBGs as ordered, Perform/monitor CIWA, COWS, AIMS and Fall Risk screenings as ordered, Perform wound care treatments as ordered.  Evaluation of Outcomes: Progressing   LCSW Treatment Plan for Primary Diagnosis: Bipolar disorder with current episode depressed (HCC) Long Term Goal(s): Safe  transition to appropriate next level of care at discharge, Engage patient in therapeutic group addressing interpersonal concerns.  Short Term Goals: Engage patient in aftercare planning with referrals and resources, Identify triggers associated with mental health/substance abuse issues and Increase skills for wellness and recovery  Therapeutic Interventions: Assess for all discharge needs, 1 to 1 time with Social worker, Explore available resources and support systems, Assess for adequacy in community support network, Educate family and significant other(s) on suicide prevention, Complete Psychosocial Assessment, Interpersonal group therapy.  Evaluation of Outcomes: Progressing   Progress in Treatment: Attending groups: Yes  Participating in groups:Yes  Taking medication as prescribed: Yes, MD continues to assess for medication changes as needed Toleration medication: Yes, no side effects reported at this time Family/Significant other contact made: No, CSW attempted 2x to contact neighbor Patient understands diagnosis: Developing insight Discussing patient identified problems/goals with staff: Yes Medical problems stabilized or resolved: Yes Denies suicidal/homicidal ideation: Endorsing passive SI Issues/concerns per patient self-inventory: None Other: N/A  New problem(s) identified: None identified at this time.   New Short Term/Long Term Goal(s): None identified at this time.   Discharge Plan or Barriers: Pt will return home and [facility]  Reason for Continuation of Hospitalization: Anxiety Depression Medication stabilization Suicidal ideation  Estimated Length of Stay: 1-3 days  Attendees: Patient: 05/05/2017  10:12 AM  Physician: Dr. Jama Flavorsobos 05/05/2017  10:12 AM  Nursing: Dorthula PerfectBeverly, Caroline, Marian, RN 05/05/2017  10:12 AM  RN Care Manager: Onnie BoerJennifer Clark, RN 05/05/2017  10:12 AM  Social Worker: Donnelly StagerLynn Nikie Cid, LCSWA;   05/05/2017  10:12 AM  Recreational Therapist:  05/05/2017  10:12 AM  Other: 05/05/2017  10:12 AM  Other:  05/05/2017  10:12 AM  Other: 05/05/2017  10:12 AM    Scribe for Treatment Team: Jonathon JordanLynn B Reganne Messerschmidt, MSW, LCSWA 05/14/17 10:12 AM

## 2017-05-15 DIAGNOSIS — F515 Nightmare disorder: Secondary | ICD-10-CM

## 2017-05-15 MED ORDER — VITAMIN D3 25 MCG (1000 UNIT) PO TABS
1000.0000 [IU] | ORAL_TABLET | Freq: Every day | ORAL | Status: DC
  Administered 2017-05-16 – 2017-05-23 (×8): 1000 [IU] via ORAL
  Filled 2017-05-15 (×3): qty 1
  Filled 2017-05-15: qty 7
  Filled 2017-05-15: qty 1
  Filled 2017-05-15: qty 7
  Filled 2017-05-15 (×2): qty 1
  Filled 2017-05-15: qty 7
  Filled 2017-05-15 (×2): qty 1

## 2017-05-15 MED ORDER — GABAPENTIN 400 MG PO CAPS
800.0000 mg | ORAL_CAPSULE | Freq: Three times a day (TID) | ORAL | Status: DC
  Administered 2017-05-15 – 2017-05-21 (×18): 800 mg via ORAL
  Filled 2017-05-15 (×27): qty 2

## 2017-05-15 NOTE — Progress Notes (Signed)
D) Pt has been attending the program. Comes to the nurses station and will request to speak with this Clinical research associatewriter. Will share that she has kidney stones and has requested cranberry juice for them. Also Pt reports that she has a blocked colon and is needing MOM daily to help her. Pt rates her depression at a 7, hopelessness at a 7 and her anxiety at a 6. Continues to have thoughts and feelings of suicide. Denies HI. Pt sits in the groups and will at times ramble, if allowed.  A) Pt given support, and reassurance. Provided with a 1:1. Encouragement given.  R) Pt states that she is feeling depressed and continues to have suicidal thoughts. Staets she will not act on her feelings while in the hospital.

## 2017-05-15 NOTE — Progress Notes (Signed)
D.  Pt reported MS Contin not helping her Sciatic pain as much as she had hoped.  Pt then came up around 30 minutes later complaining of severe muscle contractions.  Pt sat down on floor at nurses station, then lay down.  Pt crying and stating that she must have the valium for this.  Pt reiterated need for Soma and not understanding why it had been discontinued.  Pt continued to lay on floor while taking medication stating she could not get up.  [facility] and [facility]  Pt was provided with pillows on floor and a wheelchair was brought in.  Pt was able to take given medication and was, with assistance, able to get into wheelchair after around 15-20 minutes.  Pt was then assisted to quiet room and new order was received for second dose of Valium 5 mg.  Pt was assisted into bed in quiet room.  NP came to quiet room to check on Pt and AC remained with Pt during this time.  Pt expressed embarrassment that "everyone saw" her on floor.  Reassurance provided to Pt.  Pt remained in quiet room and slept until shortly after 2300 and was at that time assisted back to her own room.  Pt stated that she is also concerned about a foul odor to her urine and states that she has kidney stones.  Pt presently resting in bed in her room.  A.  Support and encouragement offered medications given to Pt as ordered.  Assistance provided to Pt throughout.  R.  Pt remains safe on unit, will continue to closely monitor.

## 2017-05-15 NOTE — Progress Notes (Signed)
Psychoeducational Group Note  Date:  05/15/2017 Time:  2237  Group Topic/Focus:  Wrap-Up Group:   The focus of this group is to help patients review their daily goal of treatment and discuss progress on daily workbooks.  Participation Level: Did Not Attend  Participation Quality:  Not Applicable  Affect:  Not Applicable  Cognitive:  Not Applicable  Insight:  Not Applicable  Engagement in Group: Not Applicable  Additional Comments:  The patient walked out of group since she was very tearful.   Hazle CocaGOODMAN, Nolita Kutter S 05/15/2017, 10:37 PM

## 2017-05-15 NOTE — Progress Notes (Signed)
[facility] MD Progress Note  05/15/2017 6:17 PM   Katelyn Galloway   MRN:  [id number]   Subjective: "I feel like my back pain isn't even controlled at all. My depression is better but still thoughts of wanting to die".   Objective: Pt seen and chart reviewed. Pt is alert/oriented x4, calm, cooperative, and appropriate to situation. Pt denies homicidal ideation and psychosis and does not appear to be responding to internal stimuli. Pt is ruminative about chronic pain and asking for numerous med changes yet complaining of constipation (improving). Pt later reported that she does not want more narcotic changes, exhibiting some borderline tendencies during these conversations. Pt did ask for Neurontin to be increased from 600-800mg  tid which I did below.   Principal Problem: Bipolar disorder with current episode depressed (HCC)  Diagnosis:   Patient Active Problem List   Diagnosis Date Noted  . Bipolar disorder with current episode depressed (HCC) [F31.30] 05/05/2017    Priority: High  . GAD (generalized anxiety disorder) [F41.1] 07/10/2010    Priority: Medium  . Chronic bilateral low back pain [M54.5, G89.29] 07/09/2009    Priority: Medium  . 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 Asthma [J45.909] 03/24/2017  . Tobacco use disorder [F17.200] 03/24/2017  . Cannabis abuse [F12.10] 03/23/2017  . Hypothyroidism [E03.9] 05/15/2015   Total Time spent with patient: 25 minutes  Past Psychiatric History: Bipolar disorder, depressed, Substance use disorder.  Past Medical History:  Past Medical History:  Diagnosis Date  . Anemia 1970  . Anxiety   . Arthritis   . Asthma    child  . Bronchitis    "years ago"  . Bronchitis    chronic hx  . Chronic back pain    hx of  . Depression   . Fibromyalgia   . GERD (gastroesophageal reflux disease)    occ  . Headache(784.0)    migraines  . Heart palpitations    occ if get anxious- no tests  . History of kidney stones    . Hypothyroidism   . Irritable bowel syndrome (IBS)   . Seizures (HCC)    x 2 during pregnancy per pt was not diagnosed with eclampsia    Past Surgical History:  Procedure Laterality Date  . ABDOMINAL HYSTERECTOMY  1992  . BACK SURGERY     x2  . CHOLECYSTECTOMY  2009 or 2010  . HARDWARE REMOVAL  12/29/2012   Procedure: HARDWARE REMOVAL;  Surgeon: Tia Alert, MD;  Location: [facility];  Service: Neurosurgery;  Laterality: N/A;  Lumbar hardware extraction  . kindey stone removal    . KNEE ARTHROSCOPY     right knee  . SPINAL CORD STIMULATOR INSERTION N/A 01/25/2015   Procedure: LUMBAR SPINAL CORD STIMULATOR INSERTION;  Surgeon: Gwynne Edinger, MD;  Location: [facility]UR[facility];  Service: Neurosurgery;  Laterality: N/A;  . TUBAL LIGATION     Family History:  Family History  Problem Relation Age of Onset  . Depression Mother   . Heart attack Mother   . Suicidality Father   . Heart disease Father   . Anesthesia problems Neg Hx    Family Psychiatric  History: See H&P  Social History:  History  Alcohol Use  . 0.6 oz/week  . 1 Glasses of wine per week     History  Drug Use  . Types: Marijuana    Social History   Social History  . Marital status: Divorced    Spouse name: N/A  .  Number of children: N/A  . Years of education: N/A   Social History Main Topics  . Smoking status: Current Some Day Smoker    Packs/day: 0.50    Years: 38.00    Types: Cigarettes  . Smokeless tobacco: Never Used     Comment: Pt reports using an electric cigarette ~ 2 times a day  . Alcohol use 0.6 oz/week    1 Glasses of wine per week  . Drug use: Yes    Types: Marijuana  . Sexual activity: Not Currently    Birth control/ protection: Surgical   Other Topics Concern  . None   Social History Narrative  . None   Additional Social History:    Pain Medications: See home med list Prescriptions: See home med list Over the Counter: See home med list History of alcohol / drug use?: No  history of alcohol / drug abuse Name of Substance 1: THC 1 - Age of First Use: 14 1 - Amount (size/oz): "1/2 to one joint" 1 - Frequency: daily for pain 1 - Duration: ongoing since 1999 1 - Last Use / Amount: 05/03/17  Sleep: Fair  Appetite:  Good  Current Medications: Current Facility-Administered Medications  Medication Dose Route Frequency Provider Last Rate Last Dose  . acetaminophen (TYLENOL) tablet 650 mg  650 mg Oral Q6H PRN Kerry HoughSimon, Spencer E, PA-C   650 mg at 05/08/17 1151  . albuterol (PROVENTIL HFA;VENTOLIN HFA) 108 (90 Base) MCG/ACT inhaler 1-2 puff  1-2 puff Inhalation Q6H PRN Kerry HoughSimon, Spencer E, PA-C      . alum & mag hydroxide-simeth (MAALOX/MYLANTA) 200-200-20 MG/5ML suspension 30 mL  30 mL Oral Q4H PRN Donell SievertSimon, Spencer E, PA-C   30 mL at 05/12/17 0837  . carbamazepine (TEGRETOL) tablet 200 mg  200 mg Oral QHS Kerry HoughSimon, Spencer E, PA-C   200 mg at 05/14/17 2158  . [START ON 05/16/2017] cholecalciferol (VITAMIN D) tablet 1,000 Units  1,000 Units Oral Daily Trany Chernick C, FNP      . diazepam (VALIUM) tablet 5 mg  5 mg Oral BID PRN Cobos, Rockey SituFernando A, MD   5 mg at 05/14/17 2158  . docusate sodium (COLACE) capsule 100 mg  100 mg Oral Daily Oneta RackLewis, Tanika N, NP   100 mg at 05/15/17 0903  . gabapentin (NEURONTIN) capsule 800 mg  800 mg Oral TID Beau FannyWithrow, Valaria Kohut C, FNP   800 mg at 05/15/17 1724  . ibuprofen (ADVIL,MOTRIN) tablet 600 mg  600 mg Oral Q6H PRN Oneta RackLewis, Tanika N, NP   600 mg at 05/14/17 2158  . levothyroxine (SYNTHROID, LEVOTHROID) tablet 25 mcg  25 mcg Oral QAC breakfast Kerry HoughSimon, Spencer E, PA-C   25 mcg at 05/15/17 [id number]  . lidocaine (LIDODERM) 5 % 1 patch  1 patch Transdermal Q24H Kerry HoughSimon, Spencer E, PA-C   1 patch at 05/15/17 [id number]  . magnesium hydroxide (MILK OF MAGNESIA) suspension 30 mL  30 mL Oral Daily PRN Donell SievertSimon, Spencer E, PA-C   30 mL at 05/14/17 1740  . morphine (MS CONTIN) 12 hr tablet 15 mg  15 mg Oral Q12H Nwoko, Agnes I, NP   15 mg at 05/15/17 0908  . nicotine (NICODERM CQ  - dosed in mg/24 hours) patch 21 mg  21 mg Transdermal Daily Donell SievertSimon, Spencer E, PA-C   21 mg at 05/14/17 0813  . polyethylene glycol (MIRALAX / GLYCOLAX) packet 17 g  17 g Oral Daily PRN Kerry HoughSimon, Spencer E, PA-C   17 g at 05/05/17 2135  .  prazosin (MINIPRESS) capsule 2 mg  2 mg Oral QHS Kerry Hough, PA-C   2 mg at 05/14/17 2157  . venlafaxine XR (EFFEXOR-XR) 24 hr capsule 150 mg  150 mg Oral Q breakfast Kerry Hough, PA-C   150 mg at 05/15/17 1610    Lab Results:  No results found for this or any previous visit (from the past 48 hour(s)). Blood Alcohol level:  Lab Results  Component Value Date   ETH <5 05/04/2017   ETH <5 03/23/2017   Metabolic Disorder Labs: Lab Results  Component Value Date   HGBA1C 5.2 03/24/2017   MPG 103 03/24/2017   No results found for: PROLACTIN Lab Results  Component Value Date   CHOL 173 03/24/2017   TRIG 139 03/24/2017   HDL 53 03/24/2017   CHOLHDL 3.3 03/24/2017   VLDL 28 03/24/2017   LDLCALC 92 03/24/2017    Physical Findings: AIMS: Facial and Oral Movements Muscles of Facial Expression: None, normal Lips and Perioral Area: None, normal Jaw: None, normal Tongue: None, normal,Extremity Movements Upper (arms, wrists, hands, fingers): None, normal Lower (legs, knees, ankles, toes): None, normal, Trunk Movements Neck, shoulders, hips: None, normal, Overall Severity Severity of abnormal movements (highest score from questions above): None, normal Incapacitation due to abnormal movements: None, normal Patient's awareness of abnormal movements (rate only patient's report): No Awareness, Dental Status Current problems with teeth and/or dentures?: No Does patient usually wear dentures?: No  CIWA:    COWS:  COWS Total Score: 0  Musculoskeletal: Strength & Muscle Tone: within normal limits Gait & Station: normal Patient leans: N/A  Psychiatric Specialty Exam: Physical Exam  Nursing note and vitals reviewed. Constitutional: She is  oriented to person, place, and time.  Cardiovascular: Normal rate.   Neurological: She is alert and oriented to person, place, and time.  Psychiatric: She has a normal mood and affect. Her behavior is normal.  : Nurse's noted & Vital signs reviewed.  Review of Systems  Genitourinary: Positive for dysuria.       Vaginal discharge odor  Psychiatric/Behavioral: Positive for depression, substance abuse and suicidal ideas. Negative for hallucinations. The patient is nervous/anxious.   All other systems reviewed and are negative. reports episode of muscle spasms earlier today, currently resolved    Blood pressure 112/76, pulse 85, temperature 99.1 F (37.3 C), temperature source Oral, resp. rate 16, height 5\' 4"  (1.626 m), weight 62.6 kg (138 lb), SpO2 99 %.Body mass index is 23.69 kg/m.  General Appearance: casual, fairly groomed  Eye Contact:   Good   Speech:   normal rate, clear, coherent  Volume:   increased  Mood: reports an improvement in depression, somatic complaints about pain  Affect: irritable, reactive, smiles at times appropriately   Thought Process: Linear, logical, ruminative about pain management  Orientation: alert, attentive , oriented x 3    Thought Content: focused on pain management, asked for numerous narcotics then stated nevermind and became irate  Suicidal Thoughts:denies suicidal or self injurious ideations, currently contracts for safety on unit   Homicidal Thoughts:   denies   Memory:   recent and remote grossly intact   Judgement:  fair- improving   Insight:  fair- improving   Psychomotor Activity:  Decreased   Concentration:  Concentration: good  and Attention Span: good   Recall:  good   Fund of Knowledge:  good   Language:  good   Akathisia:  no   Handed:  Right  AIMS (if indicated):  Assets: resilience, desire for improvement  ADL's:  Intact   Cognition:  WNL   Sleep:  Number of Hours: 8     Assessment -  Bipolar disorder with current episode  depressed (HCC) with chronic pain, unstable yet improving, managed as below:  On 05/15/2017 , I have reviewed medications below and concur with regimen with the following changes in bold:   Treatment Plan Summary: Encourage group and milieu participation to work on coping skills and symptom reduction Continue Tegretol 200 mgrs QDAY for mood disorder  Change  Valium to  5 mgrs BID PRN for anxiety as needed  Continue MS Contin 15 mgrs BID for pain  Continue Effexor XR 150 mgrs QAM for depression, anxiety Modify Neurontin to 800 mgrs TID for pain and anxiety  (was 600) Continue Minipress 2 mgrs QHS for nightmares  Treatment team working on disposition planning options.  Beau Fanny, [place] 05/15/2017 6:36 PM

## 2017-05-15 NOTE — [facility] Group Notes (Signed)
Adult Group Therapy Note  Date:  05/15/2017  Time: 10:00AM-11:00AM  Group Topic/Focus: Today's group focused on the topic of healthy and unhealthy coping skills. The initial rewarding but eventual negative elements of using unhealthy coping skills were discussed, particularly drinking, drugs, suicidal thoughts or actions, oversleeping, and isolating.  Group members talked about current healthy skills they try to use as well as specific new healthy coping skills they would like to learn.  There was a great deal of processing throughout group, and positive communication and acceptance were modeled for the patients.  At the end, a grounding exercise with patients looking at and focusing on their feet was used for 5 minutes and all patients identified themselves as being calmer afterward.  They were each given the homework of working on their identified healthy coping technique the remainder of the day.  Participation Level:  Active  Participation Quality:  Appropriate, Attentive, Sharing and Supportive  Affect:  Appropriate  Cognitive:  Appropriate  Insight: Improving  Engagement in Group:  Engaged  Modes of Intervention:  Activity, Discussion and Support  Additional Comments:  The patient expressed that she currently uses the healthy coping technique of breathing, and wants to work on turning her regular negative thoughts into positive ones.  She interacted a lot and gave feedback to other patients, not always appropriately, had to be redirected at times.  Carloyn JaegerMareida J Grossman-Orr 05/15/2017 , 11:11 AM

## 2017-05-16 NOTE — [facility] Group Notes (Signed)
Goals Group   Date:  05/16/2017  Time:  0930  Type of Therapy:  Nurse Education  /  Goals Group: The group focuses on teaching patients how to set attainable daily goals that will aide them in their walk to recvery.  Participation Level:  Active  Participation Quality:  Drowsy  Affect:  Lethargic  Cognitive:  Disorganized  Insight:  Lacking  Engagement in Group:  Monopolizing  Modes of Intervention:  Education  Summary of Progress/Problems:  Rich BraveDuke, Daelyn Mozer Lynn 05/16/2017, 6:08 PM

## 2017-05-16 NOTE — Progress Notes (Signed)
D.  Pt much brighter on approach, some anxiety, continued chronic pain.  Pt did attend evening wrap up group, observed interacting appropriately with peers on the unit.  Pt denies SI/HI/hallucinations at this time.  A.  Gave Pt valium with scheduled MS Contin to prevent muscle contraction.  Support and encouragement offered, medication given as ordered  R.  Pt remains safe on the unit.  Will continue to monitor.

## 2017-05-16 NOTE — Plan of Care (Signed)
Problem: Medication: Goal: Compliance with prescribed medication regimen will improve Outcome: Progressing Pt has been taking medication as ordered but does continue to feel that she needs more

## 2017-05-16 NOTE — [facility] Group Notes (Signed)
[facility] Group Notes: (Clinical Social Work)   05/16/2017      Type of Therapy:  Group Therapy   Participation Level:  Did Not Attend despite MHT prompting   Joelle Flessner Grossman-Orr, LCSW 05/16/2017, 12:37 PM     

## 2017-05-16 NOTE — Progress Notes (Signed)
[facility] MD Progress Note  05/16/2017 9:21 AM   Katelyn Galloway   MRN:  [id number]   Subjective:  Patient reports " I just want to go home and rout on the couch."  Reports she is irritated with other peers.   Objective: Katelyn Galloway is awake, alert and oriented *3.  Denies homicidal ideation. Denies auditory or visual hallucination and does not appear to be responding to internal stimuli. Patient has passive thought of suicidal   ideation to due an argument/disagreement with some peers on the unit.  Patient reports interacting well with staff and others.Patient reports she is medication compliant without mediation side effects. Report learning new coping skills. States her depression 5/10. Reports good appetite, states she is resting well throughout the night.  Support, encouragement and reassurance was provided.   Principal Problem: Bipolar disorder with current episode depressed (HCC)  Diagnosis:   Patient Active Problem List   Diagnosis Date Noted  . Bipolar disorder with current episode depressed (HCC) [F31.30] 05/05/2017  . 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 Asthma [J45.909] 03/24/2017  . Tobacco use disorder [F17.200] 03/24/2017  . Cannabis abuse [F12.10] 03/23/2017  . Hypothyroidism [E03.9] 05/15/2015  . GAD (generalized anxiety disorder) [F41.1] 07/10/2010  . Chronic bilateral low back pain [M54.5, G89.29] 07/09/2009   Total Time spent with patient: 25 minutes  Past Psychiatric History: Bipolar disorder, depressed, Substance use disorder.  Past Medical History:  Past Medical History:  Diagnosis Date  . Anemia 1970  . Anxiety   . Arthritis   . Asthma    child  . Bronchitis    "years ago"  . Bronchitis    chronic hx  . Chronic back pain    hx of  . Depression   . Fibromyalgia   . GERD (gastroesophageal reflux disease)    occ  . Headache(784.0)    migraines  . Heart palpitations    occ if get anxious- no tests  . History of  kidney stones   . Hypothyroidism   . Irritable bowel syndrome (IBS)   . Seizures (HCC)    x 2 during pregnancy per pt was not diagnosed with eclampsia    Past Surgical History:  Procedure Laterality Date  . ABDOMINAL HYSTERECTOMY  1992  . BACK SURGERY     x2  . CHOLECYSTECTOMY  2009 or 2010  . HARDWARE REMOVAL  12/29/2012   Procedure: HARDWARE REMOVAL;  Surgeon: Tia Alert, MD;  Location: [facility];  Service: Neurosurgery;  Laterality: N/A;  Lumbar hardware extraction  . kindey stone removal    . KNEE ARTHROSCOPY     right knee  . SPINAL CORD STIMULATOR INSERTION N/A 01/25/2015   Procedure: LUMBAR SPINAL CORD STIMULATOR INSERTION;  Surgeon: Gwynne Edinger, MD;  Location: [facility];  Service: Neurosurgery;  Laterality: N/A;  . TUBAL LIGATION     Family History:  Family History  Problem Relation Age of Onset  . Depression Mother   . Heart attack Mother   . Suicidality Father   . Heart disease Father   . Anesthesia problems Neg Hx    Family Psychiatric  History: See H&P  Social History:  History  Alcohol Use  . 0.6 oz/week  . 1 Glasses of wine per week     History  Drug Use  . Types: Marijuana    Social History   Social History  . Marital status: Divorced    Spouse name: N/A  . Number  of children: N/A  . Years of education: N/A   Social History Main Topics  . Smoking status: Current Some Day Smoker    Packs/day: 0.50    Years: 38.00    Types: Cigarettes  . Smokeless tobacco: Never Used     Comment: Pt reports using an electric cigarette ~ 2 times a day  . Alcohol use 0.6 oz/week    1 Glasses of wine per week  . Drug use: Yes    Types: Marijuana  . Sexual activity: Not Currently    Birth control/ protection: Surgical   Other Topics Concern  . None   Social History Narrative  . None   Additional Social History:    Pain Medications: See home med list Prescriptions: See home med list Over the Counter: See home med list History of alcohol / drug  use?: No history of alcohol / drug abuse Name of Substance 1: THC 1 - Age of First Use: 14 1 - Amount (size/oz): "1/2 to one joint" 1 - Frequency: daily for pain 1 - Duration: ongoing since 1999 1 - Last Use / Amount: 05/03/17  Sleep: Fair  Appetite:  Good  Current Medications: Current Facility-Administered Medications  Medication Dose Route Frequency Provider Last Rate Last Dose  . acetaminophen (TYLENOL) tablet 650 mg  650 mg Oral Q6H PRN Kerry HoughSimon, Spencer E, PA-C   650 mg at 05/08/17 1151  . albuterol (PROVENTIL HFA;VENTOLIN HFA) 108 (90 Base) MCG/ACT inhaler 1-2 puff  1-2 puff Inhalation Q6H PRN Kerry HoughSimon, Spencer E, PA-C      . alum & mag hydroxide-simeth (MAALOX/MYLANTA) 200-200-20 MG/5ML suspension 30 mL  30 mL Oral Q4H PRN Donell SievertSimon, Spencer E, PA-C   30 mL at 05/12/17 0837  . carbamazepine (TEGRETOL) tablet 200 mg  200 mg Oral QHS Donell SievertSimon, Spencer E, PA-C   200 mg at 05/15/17 2314  . cholecalciferol (VITAMIN D) tablet 1,000 Units  1,000 Units Oral Daily Beau FannyWithrow, John C, FNP   1,000 Units at 05/16/17 0840  . diazepam (VALIUM) tablet 5 mg  5 mg Oral BID PRN Cobos, Rockey SituFernando A, MD   5 mg at 05/15/17 2042  . docusate sodium (COLACE) capsule 100 mg  100 mg Oral Daily Oneta RackLewis, Zeniyah Peaster N, NP   100 mg at 05/16/17 0840  . gabapentin (NEURONTIN) capsule 800 mg  800 mg Oral TID Beau FannyWithrow, John C, FNP   800 mg at 05/16/17 0840  . ibuprofen (ADVIL,MOTRIN) tablet 600 mg  600 mg Oral Q6H PRN Oneta RackLewis, Kaelum Kissick N, NP   600 mg at 05/15/17 2017  . levothyroxine (SYNTHROID, LEVOTHROID) tablet 25 mcg  25 mcg Oral QAC breakfast Kerry HoughSimon, Spencer E, PA-C   25 mcg at 05/16/17 [id number]  . lidocaine (LIDODERM) 5 % 1 patch  1 patch Transdermal Q24H Kerry HoughSimon, Spencer E, PA-C   1 patch at 05/16/17 [phone]  . magnesium hydroxide (MILK OF MAGNESIA) suspension 30 mL  30 mL Oral Daily PRN Donell SievertSimon, Spencer E, PA-C   30 mL at 05/14/17 1740  . morphine (MS CONTIN) 12 hr tablet 15 mg  15 mg Oral Q12H Nwoko, Agnes I, NP   15 mg at 05/16/17 0840  .  nicotine (NICODERM CQ - dosed in mg/24 hours) patch 21 mg  21 mg Transdermal Daily Donell SievertSimon, Spencer E, PA-C   21 mg at 05/14/17 0813  . polyethylene glycol (MIRALAX / GLYCOLAX) packet 17 g  17 g Oral Daily PRN Kerry HoughSimon, Spencer E, PA-C   17 g at 05/05/17 2135  . prazosin (  MINIPRESS) capsule 2 mg  2 mg Oral QHS Kerry Hough, PA-C   2 mg at 05/15/17 2315  . venlafaxine XR (EFFEXOR-XR) 24 hr capsule 150 mg  150 mg Oral Q breakfast Kerry Hough, PA-C   150 mg at 05/16/17 0840    Lab Results:  No results found for this or any previous visit (from the past 48 hour(s)). Blood Alcohol level:  Lab Results  Component Value Date   ETH <5 05/04/2017   ETH <5 03/23/2017   Metabolic Disorder Labs: Lab Results  Component Value Date   HGBA1C 5.2 03/24/2017   MPG 103 03/24/2017   No results found for: PROLACTIN Lab Results  Component Value Date   CHOL 173 03/24/2017   TRIG 139 03/24/2017   HDL 53 03/24/2017   CHOLHDL 3.3 03/24/2017   VLDL 28 03/24/2017   LDLCALC 92 03/24/2017    Physical Findings: AIMS: Facial and Oral Movements Muscles of Facial Expression: None, normal Lips and Perioral Area: None, normal Jaw: None, normal Tongue: None, normal,Extremity Movements Upper (arms, wrists, hands, fingers): None, normal Lower (legs, knees, ankles, toes): None, normal, Trunk Movements Neck, shoulders, hips: None, normal, Overall Severity Severity of abnormal movements (highest score from questions above): None, normal Incapacitation due to abnormal movements: None, normal Patient's awareness of abnormal movements (rate only patient's report): No Awareness, Dental Status Current problems with teeth and/or dentures?: No Does patient usually wear dentures?: No  CIWA:    COWS:  COWS Total Score: 0  Musculoskeletal: Strength & Muscle Tone: within normal limits Gait & Station: normal Patient leans: N/A  Psychiatric Specialty Exam: Physical Exam  Nursing note and vitals  reviewed. Constitutional: She is oriented to person, place, and time.  Cardiovascular: Normal rate.   Neurological: She is alert and oriented to person, place, and time.  Skin: Skin is warm.  Psychiatric: She has a normal mood and affect. Her behavior is normal.  : Nurse's noted & Vital signs reviewed.  Review of Systems  Genitourinary: Positive for dysuria.       Vaginal discharge odor  Psychiatric/Behavioral: Positive for depression, substance abuse and suicidal ideas. Negative for hallucinations. The patient is nervous/anxious.   All other systems reviewed and are negative. reports episode of muscle spasms earlier today, currently resolved    Blood pressure 107/83, pulse 90, temperature 98.4 F (36.9 C), temperature source Oral, resp. rate 18, height 5\' 4"  (1.626 m), weight 62.6 kg (138 lb), SpO2 99 %.Body mass index is 23.69 kg/m.  General Appearance: casual  Eye Contact: minimal   Speech:   normal rate, clear, coherent  Volume:   increased  Mood: reports an improvement in depression, somatic complaints about pain  Affect: irritable, reactive, smiles at times appropriately   Thought Process: Linear, logical, ruminative about pain management  Orientation: alert, attentive , oriented x 3    Thought Content: continues to focused on pain management and pelvic pain  Suicidal Thoughts:passive   Homicidal Thoughts:   denies   Memory:   recent and remote grossly intact   Judgement:  fair- improving   Insight:  fair- improving   Psychomotor Activity:  Decreased   Concentration:  Concentration: good  and Attention Span: good   Recall:  good   Fund of Knowledge:  good   Language:  good   Akathisia:  no   Handed:  Right  AIMS (if indicated):     Assets: resilience, desire for improvement  ADL's:  Intact   Cognition:  WNL  Sleep:  Number of Hours: 8       I agree with current treatment plan on 05/16/2017, Patient seen face-to-face for psychiatric evaluation follow-up, chart  reviewed.  Reviewed the information documented and agree with the treatment plan.  Assessment -  Bipolar disorder with current episode depressed (HCC) with chronic pain, unstable yet improving, managed as below:  On 05/16/2017 , I have reviewed medications below and concur with regimen with the following changes in bold:   Treatment Plan Summary: Encourage group and milieu participation to work on coping skills and symptom reduction Continue Tegretol 200 mgrs QDAY for mood disorder  Change  Valium to  5 mgrs BID PRN for anxiety as needed  Continue MS Contin 15 mgrs BID for pain  Continue Effexor XR 150 mgrs QAM for depression, anxiety Continue with  Neurontin to 800 mgrs TID for pain and anxiety Continue Minipress 2 mgrs QHS for nightmares  Treatment team working on disposition planning options.  Oneta Rack, NP 05/16/2017 9:21 AM

## 2017-05-16 NOTE — [facility] Group Notes (Signed)
[facility] SKills   Date:  05/16/2017  Time:  0930  Type of Therapy:  Nurse Education / Identifying Needs : The group is a continuation from group started 5/26 in which patients were taught how to identify their needs (5/26) and establish healtheir coping skills( 5/27).  Participation Level:  Minimal  Participation Quality:  Drowsy  Affect:  Depressed  Cognitive:  Confused  Insight:  Lacking  Engagement in Group:  Lacking  Modes of Intervention:  Education  Summary of Progress/Problems:  Rich BraveDuke, Parv Manthey Lynn 05/16/2017, 6:59 PM

## 2017-05-16 NOTE — Progress Notes (Signed)
[facility] Group Notes:  (Nursing/MHT/Case Management/Adjunct)  Date:  05/16/2017  Time:  10:25 PM  Type of Therapy:  Psychoeducational Skills  Participation Level:  Active  Participation Quality:  Appropriate  Affect:  Appropriate  Cognitive:  Appropriate  Insight:  Improving  Engagement in Group:  Engaged  Modes of Intervention:  Education  Summary of Progress/Problems: The patient shared in group that she had a good day and she verbalized to the group as to what happened yesterday where she left group prematurely. She spoke of her appreciation for the staff and her peers. In terms of the patient's goal, she intends to learn how to "put my heart in a box" and start addressing more of her issues.   Hazle CocaGOODMAN, Katelyn Moffa S 05/16/2017, 10:25 PM
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
## 2017-05-17 NOTE — Progress Notes (Signed)
Recreation Therapy Notes  Date: 05/17/17 Time: 0930 Location: [address] Dayroom  Group Topic: Stress Management  Goal Area(s) Addresses:  Patient will verbalize importance of using healthy stress management.  Patient will identify positive emotions associated with healthy stress management.   Intervention: Stress Management  Activity :  Guided Imagery.  LRT introduced the stress management concept of guided imagery.  LRT read a script to allow patients to engage in guided imagery.  Patients were to follow along to fully participate in the activity.  Education:  Stress Management, Discharge Planning.   Education Outcome: Acknowledges edcuation/In group clarification offered/Needs additional education  Clinical Observations/Feedback: Pt did not attend group.   Kaysee Hergert, LRT/CTRS         Benedicto Capozzi A 05/17/2017 11:53 AM 

## 2017-05-17 NOTE — Progress Notes (Signed)
[facility] MD Progress Note  05/17/2017 3:15 PM   Katelyn Galloway   MRN:  [id number]   Subjective: Katelyn Galloway reports, "I'm doing some better. I had a break through on how I have been internalizing stuff because my heart is very delicate. I need some more time to get myself to learn how to turn this my behavior around because I'm going home to the same situation. I will still have to live with the same guy. I still feel suicidal, working on my coping skills. Sleep is improving".  Objective: Katelyn Galloway is awake, alert and oriented  X 3. She reports some improvement in her mood symptoms. Denies homicidal ideation. Denies auditory or visual hallucination and does not appear to be responding to internal stimuli. Patient has passive thought of suicidal  Ideations, denies any intent or plans to hurt herself or anyone else.  Patient reports interacting well with staff and others. Patient reports she is medication compliant without mediation side effects. Report learning new coping skills. States her depression 6/10. Reports good appetite, states she is resting well throughout the night.  Support, encouragement and reassurance was provided.   Principal Problem: Bipolar disorder with current episode depressed (HCC)  Diagnosis:   Patient Active Problem List   Diagnosis Date Noted  . Bipolar disorder with current episode depressed (HCC) [F31.30] 05/05/2017  . 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 Asthma [J45.909] 03/24/2017  . Tobacco use disorder [F17.200] 03/24/2017  . Cannabis abuse [F12.10] 03/23/2017  . Hypothyroidism [E03.9] 05/15/2015  . GAD (generalized anxiety disorder) [F41.1] 07/10/2010  . Chronic bilateral low back pain [M54.5, G89.29] 07/09/2009   Total Time spent with patient: 25 minutes  Past Psychiatric History: Bipolar disorder, depressed, Substance use disorder.  Past Medical History:  Past Medical History:  Diagnosis Date  . Anemia 1970  . Anxiety   .  Arthritis   . Asthma    child  . Bronchitis    "years ago"  . Bronchitis    chronic hx  . Chronic back pain    hx of  . Depression   . Fibromyalgia   . GERD (gastroesophageal reflux disease)    occ  . Headache(784.0)    migraines  . Heart palpitations    occ if get anxious- no tests  . History of kidney stones   . Hypothyroidism   . Irritable bowel syndrome (IBS)   . Seizures (HCC)    x 2 during pregnancy per pt was not diagnosed with eclampsia    Past Surgical History:  Procedure Laterality Date  . ABDOMINAL HYSTERECTOMY  1992  . BACK SURGERY     x2  . CHOLECYSTECTOMY  2009 or 2010  . HARDWARE REMOVAL  12/29/2012   Procedure: HARDWARE REMOVAL;  Surgeon: Tia Alert, MD;  Location: [facility];  Service: Neurosurgery;  Laterality: N/A;  Lumbar hardware extraction  . kindey stone removal    . KNEE ARTHROSCOPY     right knee  . SPINAL CORD STIMULATOR INSERTION N/A 01/25/2015   Procedure: LUMBAR SPINAL CORD STIMULATOR INSERTION;  Surgeon: Gwynne Edinger, MD;  Location: [facility];  Service: Neurosurgery;  Laterality: N/A;  . TUBAL LIGATION     Family History:  Family History  Problem Relation Age of Onset  . Depression Mother   . Heart attack Mother   . Suicidality Father   . Heart disease Father   . Anesthesia problems Neg Hx    Family Psychiatric  History: See H&P  Social History:  History  Alcohol Use  . 0.6 oz/week  . 1 Glasses of wine per week     History  Drug Use  . Types: Marijuana    Social History   Social History  . Marital status: Divorced    Spouse name: N/A  . Number of children: N/A  . Years of education: N/A   Social History Main Topics  . Smoking status: Current Some Day Smoker    Packs/day: 0.50    Years: 38.00    Types: Cigarettes  . Smokeless tobacco: Never Used     Comment: Pt reports using an electric cigarette ~ 2 times a day  . Alcohol use 0.6 oz/week    1 Glasses of wine per week  . Drug use: Yes    Types: Marijuana   . Sexual activity: Not Currently    Birth control/ protection: Surgical   Other Topics Concern  . None   Social History Narrative  . None   Additional Social History:    Pain Medications: See home med list Prescriptions: See home med list Over the Counter: See home med list History of alcohol / drug use?: No history of alcohol / drug abuse Name of Substance 1: THC 1 - Age of First Use: 14 1 - Amount (size/oz): "1/2 to one joint" 1 - Frequency: daily for pain 1 - Duration: ongoing since 1999 1 - Last Use / Amount: 05/03/17  Sleep: Fair  Appetite:  Good  Current Medications: Current Facility-Administered Medications  Medication Dose Route Frequency Provider Last Rate Last Dose  . acetaminophen (TYLENOL) tablet 650 mg  650 mg Oral Q6H PRN Kerry Hough, PA-C   650 mg at 05/08/17 1151  . albuterol (PROVENTIL HFA;VENTOLIN HFA) 108 (90 Base) MCG/ACT inhaler 1-2 puff  1-2 puff Inhalation Q6H PRN Kerry Hough, PA-C      . alum & mag hydroxide-simeth (MAALOX/MYLANTA) 200-200-20 MG/5ML suspension 30 mL  30 mL Oral Q4H PRN Donell Sievert E, PA-C   30 mL at 05/12/17 0837  . carbamazepine (TEGRETOL) tablet 200 mg  200 mg Oral QHS Donell Sievert E, PA-C   200 mg at 05/16/17 2202  . cholecalciferol (VITAMIN D) tablet 1,000 Units  1,000 Units Oral Daily Beau Fanny, FNP   1,000 Units at 05/17/17 0759  . diazepam (VALIUM) tablet 5 mg  5 mg Oral BID PRN Cobos, Rockey Situ, MD   5 mg at 05/16/17 1942  . docusate sodium (COLACE) capsule 100 mg  100 mg Oral Daily Oneta Rack, NP   100 mg at 05/17/17 0759  . gabapentin (NEURONTIN) capsule 800 mg  800 mg Oral TID Beau Fanny, FNP   800 mg at 05/17/17 1254  . ibuprofen (ADVIL,MOTRIN) tablet 600 mg  600 mg Oral Q6H PRN Oneta Rack, NP   600 mg at 05/17/17 0606  . levothyroxine (SYNTHROID, LEVOTHROID) tablet 25 mcg  25 mcg Oral QAC breakfast Kerry Hough, PA-C   25 mcg at 05/17/17 0606  . lidocaine (LIDODERM) 5 % 1 patch  1  patch Transdermal Q24H Kerry Hough, PA-C   1 patch at 05/17/17 0759  . magnesium hydroxide (MILK OF MAGNESIA) suspension 30 mL  30 mL Oral Daily PRN Donell Sievert E, PA-C   30 mL at 05/14/17 1740  . morphine (MS CONTIN) 12 hr tablet 15 mg  15 mg Oral Q12H Nwoko, Agnes I, NP   15 mg at 05/17/17 0804  . polyethylene glycol (MIRALAX /  GLYCOLAX) packet 17 g  17 g Oral Daily PRN Kerry Hough, PA-C   17 g at 05/05/17 2135  . prazosin (MINIPRESS) capsule 2 mg  2 mg Oral QHS Kerry Hough, PA-C   2 mg at 05/16/17 2202  . venlafaxine XR (EFFEXOR-XR) 24 hr capsule 150 mg  150 mg Oral Q breakfast Kerry Hough, PA-C   150 mg at 05/17/17 1610   Lab Results:  No results found for this or any previous visit (from the past 48 hour(s)).  Blood Alcohol level:  Lab Results  Component Value Date   ETH <5 05/04/2017   ETH <5 03/23/2017   Metabolic Disorder Labs: Lab Results  Component Value Date   HGBA1C 5.2 03/24/2017   MPG 103 03/24/2017   No results found for: PROLACTIN Lab Results  Component Value Date   CHOL 173 03/24/2017   TRIG 139 03/24/2017   HDL 53 03/24/2017   CHOLHDL 3.3 03/24/2017   VLDL 28 03/24/2017   LDLCALC 92 03/24/2017    Physical Findings: AIMS: Facial and Oral Movements Muscles of Facial Expression: None, normal Lips and Perioral Area: None, normal Jaw: None, normal Tongue: None, normal,Extremity Movements Upper (arms, wrists, hands, fingers): None, normal Lower (legs, knees, ankles, toes): None, normal, Trunk Movements Neck, shoulders, hips: None, normal, Overall Severity Severity of abnormal movements (highest score from questions above): None, normal Incapacitation due to abnormal movements: None, normal Patient's awareness of abnormal movements (rate only patient's report): No Awareness, Dental Status Current problems with teeth and/or dentures?: No Does patient usually wear dentures?: No  CIWA:    COWS:  COWS Total Score:  0  Musculoskeletal: Strength & Muscle Tone: within normal limits Gait & Station: normal Patient leans: N/A  Psychiatric Specialty Exam: Physical Exam  Nursing note and vitals reviewed. Constitutional: She is oriented to person, place, and time.  Cardiovascular: Normal rate.   Neurological: She is alert and oriented to person, place, and time.  Skin: Skin is warm.  Psychiatric: She has a normal mood and affect. Her behavior is normal.  : Nurse's noted & Vital signs reviewed.  Review of Systems  Genitourinary:       Vaginal discharge odor  Psychiatric/Behavioral: Positive for depression ("Improving"), substance abuse and suicidal ideas (Fleeting thougths, denies any plans or intent). Negative for hallucinations and memory loss. The patient is nervous/anxious and has insomnia ("Improved").   All other systems reviewed and are negative. reports episode of muscle spasms earlier today, currently resolved    Blood pressure (!) 92/56, pulse 73, temperature 98.3 F (36.8 C), temperature source Oral, resp. rate 16, height 5\' 4"  (1.626 m), weight 62.6 kg (138 lb), SpO2 99 %.Body mass index is 23.69 kg/m.  General Appearance: casual  Eye Contact: minimal   Speech:   normal rate, clear, coherent  Volume:   increased  Mood: reports an improvement in depression, somatic complaints about pain  Affect: irritable, reactive, smiles at times appropriately   Thought Process: Linear, logical, ruminative about pain management  Orientation: alert, attentive , oriented x 3    Thought Content: continues to focused on pain management and pelvic pain  Suicidal Thoughts:passive   Homicidal Thoughts:   denies   Memory:   recent and remote grossly intact   Judgement:  fair- improving   Insight:  fair- improving   Psychomotor Activity:  Decreased   Concentration:  Concentration: good  and Attention Span: good   Recall:  good   Fund of Knowledge:  good  Language:  good   Akathisia:  no   Handed:  Right   AIMS (if indicated):     Assets: resilience, desire for improvement  ADL's:  Intact   Cognition:  WNL   Sleep:  Number of Hours: 8     Assessment -  Bipolar disorder with current episode depressed (HCC) with chronic pain, unstable yet improving, managed as below:  On 05/17/2017, I have reviewed medications below and concur with regimen with the following changes in bold:   Treatment Plan Summary: Encourage group and milieu participation to work on coping skills and symptom reduction Continue Tegretol 200 mgrs QDAY for mood disorder  Continue Valium 5 mgrs BID PRN for anxiety as needed  Continue MS Contin 15 mgrs BID for pain  Continue Effexor XR 150 mgrs QAM for depression, anxiety Continue with  Neurontin to 800 mgrs TID for pain and anxiety Continue Minipress 2 mgrs QHS for nightmares  Treatment team working on disposition planning options.  Sanjuana KavaNwoko, Agnes I, NP, PMHNP, FNP-BC 05/17/2017 3:15 PMPatient ID: Kathreen CosierKimberly G Gallus, female   DOB: 04/14/1961, 56 y.o.   MRN: [id number]

## 2017-05-17 NOTE — [facility] Group Notes (Signed)
[facility]   05/17/2017  8:45 AM  Participation Quality: Pt attended. Did not aprticipate in the discussion. Was engaged in a conversation with a peer.  Jonathon JordanLynn B Mackenzey Crownover, MSW, LCSWA 05/17/2017 1:04 PM

## 2017-05-17 NOTE — Progress Notes (Signed)
[facility] Group Notes:  (Nursing/MHT/Case Management/Adjunct)  Date:  05/17/2017  Time:  11:25 PM  Type of Therapy:  Psychoeducational Skills  Participation Level:  Active  Participation Quality:  Appropriate  Affect:  Appropriate  Cognitive:  Appropriate  Insight:  Improving  Engagement in Group:  Developing/Improving  Modes of Intervention:  Education  Summary of Progress/Problems: Patient verbalized that she had a great day and that she had a good visit with her husband. As for the theme of the day, her wellness strategy will be to work on being "less paranoid" and "internalize less".   Hazle CocaGOODMAN, Nzinga Ferran S 05/17/2017, 11:25 PM

## 2017-05-17 NOTE — Progress Notes (Signed)
D: Patient's self inventory sheet: patient has fair to poor sleep, did not receive sleep medication.fair to poor  Appetite, normal energy level, uncertain about concentration. Rated depression 8/10, hopeless 8/10, anxiety 6/10. SI/HI/AVH: Continues to endorse thoughts of suicide, denies HI and AVH. Physical complaints are chronic pain in back, legs, neck and sciatic nerve. Goal is "not to internalize or feel paranoid". Plans to work on "act like a duck and let things roll off, laugh".   A: Medications administered, assessed medication knowledge and education given on medication regimen.  Emotional support and encouragement given patient. R: Reports SI and denies HI , contracts for safety. Safety maintained with 15 minute checks.

## 2017-05-18 DIAGNOSIS — F3130 Bipolar disorder, current episode depressed, mild or moderate severity, unspecified: Principal | ICD-10-CM

## 2017-05-18 DIAGNOSIS — F1990 Other psychoactive substance use, unspecified, uncomplicated: Secondary | ICD-10-CM

## 2017-05-18 DIAGNOSIS — F39 Unspecified mood [affective] disorder: Secondary | ICD-10-CM
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
## 2017-05-18 NOTE — Progress Notes (Signed)
D: Pt presents with flat affect. Pt verbalized that she's anxious about having to discharge. Pt appears med seeking and attention seeking this morning. Pt verbalized wanting to take Soma, a medication that was d/c'd. Pt spoke with Velna HatchetSheila May, NP., in regards to medication changes. Pt then requested to take Valium. While standing at the nurses station, pt began shaking and verbalized to staff that she was having a seizure. Pt was noted to be shaking her hands and upper torso. When Beaveraroline, RN., offered pt Valium po, pt stopped shaking long enough to take med. Pt was noted to be A&O x4 during episode. Pt was placed in a wheelchair by Gaspar ColaSheila May, NP., and Rayfield Citizenaroline, RN. Pt then requested to go lie down in her room. Pt then verbalized to writer how she wanted to be alone because the other pts have been saying that she's faking seizures to get medications. Pt v/s B/P 152/8, P- 89, O2 100% and resp 20. NP aware of the results.  Medications reviewed with pt. Medications administered as ordered per MD. Verbal support provided. Pt encouraged to attend groups. 15 minute checks performed for safety.  R: Pt compliant with tx.

## 2017-05-18 NOTE — Progress Notes (Signed)
D: Pt was in the dayroom upon initial approach.  Pt presents with anxious, depressed affect and mood.  She reports her day was "rough."  Pt reports goal is "not to have a night terror."  Pt denies SI initially.  Later in shift, she approached writer tearfully and reported being upset because her peers "say I'm faking seizures."  At that time, she stated "I can't say yes or no" when asked if she has SI.  She verbally contracts for safety.  She denies HI, denies hallucinations, reports chronic back pain of 7/10.  Pt continues to seek attention from staff and peers.  She remains intrusive, discussing with staff how she is concerned about visitors her peers have and illnesses of her peers.  Pt attended evening group.    A: Introduced self to pt.  Actively listened to pt and offered support and encouragement. Medications administered per order.  PRN medication administered for severe anxiety and pain.  Positive coping skills encouraged and reinforced.  Pt reports positive coping skills as "coloring" and writing in her journal.  Q15 minute safety checks maintained.  R: Pt is safe on the unit.  Pt is compliant with medications.  Pt verbally contracts for safety.  Will continue to monitor and assess.

## 2017-05-18 NOTE — Progress Notes (Signed)
[facility] MD Progress Note  05/18/2017 4:25 PM   Katelyn Galloway   MRN:  [id number]   Subjective:  Patient was at nurses station, "I need my Valium."  Objective:  Patient was at nurses station, appeared anxious.  Patient was looking for her nurse.  She states that she felt very nervous.  Patient then proceeded to lower herself to floor.  She was anxious and shaking.  She states that I get nervous and I need my Valium. "  Patient did not lose consciousness.  Vital signs taken  and stable.    Principal Problem: Bipolar disorder with current episode depressed (HCC)  Diagnosis:   Patient Active Problem List   Diagnosis Date Noted  . Bipolar disorder with current episode depressed (HCC) [F31.30] 05/05/2017  . 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 Asthma [J45.909] 03/24/2017  . Tobacco use disorder [F17.200] 03/24/2017  . Cannabis abuse [F12.10] 03/23/2017  . Hypothyroidism [E03.9] 05/15/2015  . GAD (generalized anxiety disorder) [F41.1] 07/10/2010  . Chronic bilateral low back pain [M54.5, G89.29] 07/09/2009   Total Time spent with patient: 25 minutes  Past Psychiatric History: Bipolar disorder, depressed, Substance use disorder.  Past Medical History:  Past Medical History:  Diagnosis Date  . Anemia 1970  . Anxiety   . Arthritis   . Asthma    child  . Bronchitis    "years ago"  . Bronchitis    chronic hx  . Chronic back pain    hx of  . Depression   . Fibromyalgia   . GERD (gastroesophageal reflux disease)    occ  . Headache(784.0)    migraines  . Heart palpitations    occ if get anxious- no tests  . History of kidney stones   . Hypothyroidism   . Irritable bowel syndrome (IBS)   . Seizures (HCC)    x 2 during pregnancy per pt was not diagnosed with eclampsia    Past Surgical History:  Procedure Laterality Date  . ABDOMINAL HYSTERECTOMY  1992  . BACK SURGERY     x2  . CHOLECYSTECTOMY  2009 or 2010  . HARDWARE REMOVAL  12/29/2012    Procedure: HARDWARE REMOVAL;  Surgeon: Tia Alert, MD;  Location: [facility];  Service: Neurosurgery;  Laterality: N/A;  Lumbar hardware extraction  . kindey stone removal    . KNEE ARTHROSCOPY     right knee  . SPINAL CORD STIMULATOR INSERTION N/A 01/25/2015   Procedure: LUMBAR SPINAL CORD STIMULATOR INSERTION;  Surgeon: Gwynne Edinger, MD;  Location: [facility];  Service: Neurosurgery;  Laterality: N/A;  . TUBAL LIGATION     Family History:  Family History  Problem Relation Age of Onset  . Depression Mother   . Heart attack Mother   . Suicidality Father   . Heart disease Father   . Anesthesia problems Neg Hx    Family Psychiatric  History: See H&P  Social History:  History  Alcohol Use  . 0.6 oz/week  . 1 Glasses of wine per week     History  Drug Use  . Types: Marijuana    Social History   Social History  . Marital status: Divorced    Spouse name: N/A  . Number of children: N/A  . Years of education: N/A   Social History Main Topics  . Smoking status: Current Some Day Smoker    Packs/day: 0.50    Years: 38.00    Types: Cigarettes  . Smokeless  tobacco: Never Used     Comment: Pt reports using an electric cigarette ~ 2 times a day  . Alcohol use 0.6 oz/week    1 Glasses of wine per week  . Drug use: Yes    Types: Marijuana  . Sexual activity: Not Currently    Birth control/ protection: Surgical   Other Topics Concern  . None   Social History Narrative  . None   Additional Social History:    Pain Medications: See home med list Prescriptions: See home med list Over the Counter: See home med list History of alcohol / drug use?: No history of alcohol / drug abuse Name of Substance 1: THC 1 - Age of First Use: 14 1 - Amount (size/oz): "1/2 to one joint" 1 - Frequency: daily for pain 1 - Duration: ongoing since 1999 1 - Last Use / Amount: 05/03/17  Sleep: Fair  Appetite:  Good  Current Medications: Current Facility-Administered Medications   Medication Dose Route Frequency Provider Last Rate Last Dose  . acetaminophen (TYLENOL) tablet 650 mg  650 mg Oral Q6H PRN Kerry HoughSimon, Spencer E, PA-C   650 mg at 05/08/17 1151  . albuterol (PROVENTIL HFA;VENTOLIN HFA) 108 (90 Base) MCG/ACT inhaler 1-2 puff  1-2 puff Inhalation Q6H PRN Kerry HoughSimon, Spencer E, PA-C      . alum & mag hydroxide-simeth (MAALOX/MYLANTA) 200-200-20 MG/5ML suspension 30 mL  30 mL Oral Q4H PRN Donell SievertSimon, Spencer E, PA-C   30 mL at 05/12/17 0837  . carbamazepine (TEGRETOL) tablet 200 mg  200 mg Oral QHS Donell SievertSimon, Spencer E, PA-C   200 mg at 05/17/17 2236  . cholecalciferol (VITAMIN D) tablet 1,000 Units  1,000 Units Oral Daily Beau FannyWithrow, John C, FNP   1,000 Units at 05/18/17 0809  . diazepam (VALIUM) tablet 5 mg  5 mg Oral BID PRN Cobos, Rockey SituFernando A, MD   5 mg at 05/18/17 1151  . docusate sodium (COLACE) capsule 100 mg  100 mg Oral Daily Oneta RackLewis, Tanika N, NP   100 mg at 05/18/17 0809  . gabapentin (NEURONTIN) capsule 800 mg  800 mg Oral TID Beau FannyWithrow, John C, FNP   800 mg at 05/18/17 1210  . ibuprofen (ADVIL,MOTRIN) tablet 600 mg  600 mg Oral Q6H PRN Oneta RackLewis, Tanika N, NP   600 mg at 05/17/17 1711  . levothyroxine (SYNTHROID, LEVOTHROID) tablet 25 mcg  25 mcg Oral QAC breakfast Kerry HoughSimon, Spencer E, PA-C   25 mcg at 05/18/17 [id number]  . lidocaine (LIDODERM) 5 % 1 patch  1 patch Transdermal Q24H Kerry HoughSimon, Spencer E, PA-C   1 patch at 05/18/17 0809  . magnesium hydroxide (MILK OF MAGNESIA) suspension 30 mL  30 mL Oral Daily PRN Kerry HoughSimon, Spencer E, PA-C   30 mL at 05/17/17 1712  . morphine (MS CONTIN) 12 hr tablet 15 mg  15 mg Oral Q12H Nwoko, Agnes I, NP   15 mg at 05/18/17 0809  . polyethylene glycol (MIRALAX / GLYCOLAX) packet 17 g  17 g Oral Daily PRN Kerry HoughSimon, Spencer E, PA-C   17 g at 05/05/17 2135  . prazosin (MINIPRESS) capsule 2 mg  2 mg Oral QHS Donell SievertSimon, Spencer E, PA-C   2 mg at 05/17/17 2236  . venlafaxine XR (EFFEXOR-XR) 24 hr capsule 150 mg  150 mg Oral Q breakfast Kerry HoughSimon, Spencer E, PA-C   150 mg at  05/18/17 [id number]   Lab Results:  No results found for this or any previous visit (from the past 48 hour(s)).  Blood Alcohol level:  Lab Results  Component Value Date   ETH <5 05/04/2017   ETH <5 03/23/2017   Metabolic Disorder Labs: Lab Results  Component Value Date   HGBA1C 5.2 03/24/2017   MPG 103 03/24/2017   No results found for: PROLACTIN Lab Results  Component Value Date   CHOL 173 03/24/2017   TRIG 139 03/24/2017   HDL 53 03/24/2017   CHOLHDL 3.3 03/24/2017   VLDL 28 03/24/2017   LDLCALC 92 03/24/2017    Physical Findings: AIMS: Facial and Oral Movements Muscles of Facial Expression: None, normal Lips and Perioral Area: None, normal Jaw: None, normal Tongue: None, normal,Extremity Movements Upper (arms, wrists, hands, fingers): None, normal Lower (legs, knees, ankles, toes): None, normal, Trunk Movements Neck, shoulders, hips: None, normal, Overall Severity Severity of abnormal movements (highest score from questions above): None, normal Incapacitation due to abnormal movements: None, normal Patient's awareness of abnormal movements (rate only patient's report): No Awareness, Dental Status Current problems with teeth and/or dentures?: No Does patient usually wear dentures?: No  CIWA:    COWS:  COWS Total Score: 0  Musculoskeletal: Strength & Muscle Tone: within normal limits Gait & Station: normal Patient leans: N/A  Psychiatric Specialty Exam: Physical Exam  Nursing note and vitals reviewed. Constitutional: She is oriented to person, place, and time.  Cardiovascular: Normal rate.   Neurological: She is alert and oriented to person, place, and time.  Skin: Skin is warm.  Psychiatric: She has a normal mood and affect. Her behavior is normal.  : Nurse's noted & Vital signs reviewed.  Review of Systems  Genitourinary:       Vaginal discharge odor  Psychiatric/Behavioral: Positive for depression ("Improving"), substance abuse and suicidal ideas (Fleeting  thougths, denies any plans or intent). Negative for hallucinations and memory loss. The patient is nervous/anxious and has insomnia ("Improved").   All other systems reviewed and are negative. reports episode of muscle spasms earlier today, currently resolved    Blood pressure 109/70, pulse 74, temperature 97.9 F (36.6 C), temperature source Oral, resp. rate 16, height 5\' 4"  (1.626 m), weight 62.6 kg (138 lb), SpO2 99 %.Body mass index is 23.69 kg/m.  General Appearance: casual  Eye Contact: minimal   Speech:   normal rate, clear, coherent  Volume:   increased  Mood: reports an improvement in depression, somatic complaints about pain  Affect: irritable, reactive, smiles at times appropriately, anxious  Thought Process: Linear, logical, ruminative about pain management  Orientation: alert, attentive , oriented x 3    Thought Content: continues to focused on pain management and pelvic pain  Suicidal Thoughts:passive   Homicidal Thoughts:   denies   Memory:   recent and remote grossly intact   Judgement:  fair- improving   Insight:  fair- improving   Psychomotor Activity:  Decreased   Concentration:  Concentration: good  and Attention Span: good   Recall:  good   Fund of Knowledge:  good   Language:  good   Akathisia:  no   Handed:  Right  AIMS (if indicated):     Assets: resilience, desire for improvement  ADL's:  Intact   Cognition:  WNL   Sleep:  Number of Hours: 8     Assessment -  Bipolar disorder with current episode depressed (HCC) with chronic pain, unstable yet improving, managed as below:  On 05/18/2017, treatment plan and medications reviewed below and concur with regimen with the following changes in bold:   Treatment Plan Summary: Encourage group and milieu  participation to work on Pharmacologist and symptom reduction Continue Tegretol 200 mgrs QDAY for mood disorder  Continue Valium 5 mgrs BID PRN for anxiety as needed  Continue MS Contin 15 mgrs BID for pain   Continue Effexor XR 150 mgrs QAM for depression, anxiety Continue with  Neurontin to 800 mgrs TID for pain and anxiety Continue Minipress 2 mgrs QHS for nightmares  Treatment team working on disposition planning options.  Katelyn Hatchet May Margarita Bobrowski, NP-BC 05/18/2017 4:25 PM

## 2017-05-18 NOTE — [facility] Group Notes (Signed)
[facility] LCSW Group Therapy  05/18/2017 1:15pm  Type of Therapy: Group Therapy   Topic: Overcoming Obstacles  Participation Level: Active  Participation Quality: Appropriate   Affect: Depressed   Cognitive: Appropriate and Oriented  Insight: Developing/Improving and Improving  Engagement in Therapy: Improving  Modes of Intervention: Discussion, Exploration, Problem-solving and Support  Description of Group:  In this group patients will be encouraged to explore what they see as obstacles to their own wellness and recovery. They will be guided to discuss their thoughts, feelings, and behaviors related to these obstacles. The group will process together ways to cope with barriers, with attention given to specific choices patients can make. Each patient will be challenged to identify changes they are motivated to make in order to overcome their obstacles. This group will be process-oriented, with patients participating in exploration of their own experiences as well as giving and receiving support and challenge from other group members.  Summary of Patient Progress:  Pt states that her biggest obstacle is "getting affairs in order". When probed further about this pt states that she needs to get her affairs in order before she completes suicide. Pt was very lethargic throughout the group and had a hood over her head for the duration of group. Pt states that her physical pain is too much to deal with since a lot of her medications have been discontinued.   Therapeutic Modalities:  Cognitive Behavioral Therapy Solution Focused Therapy Motivational Interviewing Relapse Prevention Therapy  Jonathon JordanLynn B Quintana Canelo, MSW, Theresia MajorsLCSWA [phone]

## 2017-05-18 NOTE — Progress Notes (Signed)
Recreation Therapy Notes  Animal-Assisted Activity (AAA) Program Checklist/Progress Notes Patient Eligibility Criteria Checklist & Daily Group note for Rec TxIntervention  Date: 05/18/2017 Time: 2:55pm Location: 400 hall dayroom   AAA/T Program Assumption of Risk Form signed by Patient/ or Parent Legal Guardian Yes  Patient is free of allergies or sever asthma Yes  Patient reports no fear of animals Yes  Patient reports no history of cruelty to animals Yes  Patient understands his/her participation is voluntary Yes  Behavioral Response: Did not attend.    Katelyn Fullerachel Jayda White, Recreational Therapy Intern        Katelyn FullerRachel Sueann Galloway 05/18/2017 4:01 PM

## 2017-05-18 NOTE — Progress Notes (Signed)
D: Pt was in the dayroom upon initial approach.  Pt presents with anxious affect and mood.  She describes her day as "awesome" and reports a goal of "I don't want to have a seizure, continue to be upbeat and not internalize."  Pt denies SI/HI, denies hallucinations, reports chronic generalized pain of 7/10.  Pt has been visible in milieu interacting with peers.  She is attention-seeking and intrusive at times.  Pt attended evening group.    A: Introduced self to pt.  Actively listened to pt and offered support and encouragement.  Encouraged pt to focus on her treatment.  Medications administered per order.  PRN medication administered for severe anxiety.  Q15 minute safety checks maintained.  R: Pt is safe on the unit.  Pt is compliant with medications.  Pt verbally contracts for safety.  Will continue to monitor and assess.

## 2017-05-18 NOTE — Progress Notes (Signed)
Adult Psychoeducational Group Note  Date:  05/18/2017 Time:  9:03 PM  Group Topic/Focus:  Wrap-Up Group:   The focus of this group is to help patients review their daily goal of treatment and discuss progress on daily workbooks.  Participation Level:  Active  Participation Quality:  Appropriate and Attentive  Affect:  Appropriate  Cognitive:  Appropriate  Insight: Appropriate  Engagement in Group:  Engaged  Modes of Intervention:  Discussion  Additional Comments:  Pt stated her goal for today was to not internalize, pt stated she wasn't really able to do that. Pt stated she wanted to change her goal to learn how to not have these "muscle seizures."Pt identified 2 coping skills, deep breathing and muscle relaxation techniques.  Caswell CorwinOwen, Tashunda Vandezande C 05/18/2017, 9:03 PM

## 2017-05-19 MED ORDER — BACLOFEN 10 MG PO TABS
10.0000 mg | ORAL_TABLET | Freq: Two times a day (BID) | ORAL | Status: DC | PRN
  Administered 2017-05-21 – 2017-05-22 (×2): 10 mg via ORAL
  Filled 2017-05-19: qty 6
  Filled 2017-05-19 (×2): qty 1

## 2017-05-19 NOTE — [facility] Group Notes (Signed)
[facility] LCSW Group Therapy 05/19/2017 1:15 PM  Type of Therapy: Group Therapy- Emotion Regulation  Participation Level: Active   Participation Quality:  Appropriate  Affect: Appropriate  Cognitive: Alert and Oriented   Insight:  Developing/Improving  Engagement in Therapy: Developing/Improving and Engaged   Modes of Intervention: Clarification, Confrontation, Discussion, Education, Exploration, Limit-setting, Orientation, Problem-solving, Rapport Building, Dance movement psychotherapisteality Testing, Socialization and Support  Summary of Progress/Problems: The topic for group today was emotional regulation. This group focused on both positive and negative emotion identification and allowed group members to process ways to identify feelings, regulate negative emotions, and find healthy ways to manage internal/external emotions. Group members were asked to reflect on a time when their reaction to an emotion led to a negative outcome and explored how alternative responses using emotion regulation would have benefited them. Group members were also asked to discuss a time when emotion regulation was utilized when a negative emotion was experienced. Pt spoke about needing to end her relationship with her longtime partner because it is not good for her mental health. Pt states that she knows she'll be happier without him but she is nervous because she doesn't want to hurt him. Pt became tearful at certain points during group but was future oriented and overall positive.   Katelyn JordanLynn Galloway Katelyn Galloway, MSW, LCSWA 05/19/2017 4:35 PM

## 2017-05-19 NOTE — Progress Notes (Signed)
DAR NOTE: Patient presents with anxious affect and depressed mood. Pt has been labile and intrusive a times and make others worked up. Denies pain, auditory and visual hallucinations, reported passive SI and was able contract for safety. Rates depression at 7, hopelessness at 7, and anxiety at 6.  Maintained on routine safety checks.  Medications given as prescribed.  Support and encouragement offered as needed.  Attended group and participated.  States goal for today is" listen to [employer], Child psychotherapistsocial worker and talk to friends."  Patient observed socializing with peers in the dayroom.
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
## 2017-05-19 NOTE — Progress Notes (Signed)
Adult Psychoeducational Group Note  Date:  05/19/2017 Time:  9:26 PM  Group Topic/Focus:  Wrap-Up Group:   The focus of this group is to help patients review their daily goal of treatment and discuss progress on daily workbooks.  Participation Level:  Active  Participation Quality:  Appropriate and Attentive  Affect:  Appropriate  Cognitive:  Appropriate  Insight: Appropriate  Engagement in Group:  Engaged  Modes of Intervention:  Discussion  Additional Comments:  Pt stated her goal was to just get through the day without crying. Pt stated one positive thing is that she has made the decision on how to move forward with her partner and life. Pt says she does not know how to go about her decision, but it is a work in progress.  Caswell CorwinOwen, Zarinah Oviatt C 05/19/2017, 9:26 PM

## 2017-05-19 NOTE — Progress Notes (Signed)
Recreation Therapy Notes  Date: 05/19/17 Time: 0930 Location: [address] Dayroom  Group Topic: Stress Management  Goal Area(s) Addresses:  Patient will verbalize importance of using healthy stress management.  Patient will identify positive emotions associated with healthy stress management.   Intervention: Stress Management  Activity :  Body Scan Meditation.  LRT introduced the stress management technique of meditation.  LRT played a meditation to allow patients to take inventory of the sensations they are feeling in their body.  Patients were to follow along as the meditation was played to fully engage in the technique.  Education:  Stress Management, Discharge Planning.   Education Outcome: Acknowledges edcuation/In group clarification offered/Needs additional education  Clinical Observations/Feedback: Pt did not attend group.   Nicholad Kautzman, LRT/CTRS         [name] A 05/19/2017 11:34 AM 

## 2017-05-19 NOTE — Progress Notes (Signed)
Pt is on unit and participates in group.  Pt is asking for "all my meds".  Pt nervous and sts she is tired.  Pt is slightly irritable.  Pt denies SI, HI and AVH.  Pt contracts for safety.  Pt describes some pain but wants to "get off the morphine"  Pt requesting sleep meds and wants to go to bed early. Pt given PRN medications.  Pt returns to room for night.  Pt is currently asleep.  Pt remains safe on unit.

## 2017-05-20 MED ORDER — MORPHINE SULFATE ER 15 MG PO TBCR
15.0000 mg | EXTENDED_RELEASE_TABLET | ORAL | Status: DC
  Administered 2017-05-21: 15 mg via ORAL
  Filled 2017-05-20: qty 1

## 2017-05-20 NOTE — Progress Notes (Signed)
[facility]  05/20/2017 10:21 AM Katelyn Galloway  MRN:  [id number] Subjective:  Katelyn Galloway reports that she continues to be quite depressed, does not feel safe to return home. She would like to be tapered off of morphine, and be "clean". She continues to struggle with tearfulness, mood lability.  Principal Problem: Bipolar disorder with current episode depressed (HCC) Diagnosis:   Patient Active Problem List   Diagnosis Date Noted  . Bipolar disorder with current episode depressed (HCC) [F31.30] 05/05/2017  . 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 Asthma [J45.909] 03/24/2017  . Tobacco use disorder [F17.200] 03/24/2017  . Cannabis abuse [F12.10] 03/23/2017  . Hypothyroidism [E03.9] 05/15/2015  . GAD (generalized anxiety disorder) [F41.1] 07/10/2010  . Chronic bilateral low back pain [M54.5, G89.29] 07/09/2009   Total Time spent with patient: 15 minutes  Past Psychiatric History: See intake H&P for full details. Reviewed, with no updates at this time.   Past Medical History:  Past Medical History:  Diagnosis Date  . Anemia 1970  . Anxiety   . Arthritis   . Asthma    child  . Bronchitis    "years ago"  . Bronchitis    chronic hx  . Chronic back pain    hx of  . Depression   . Fibromyalgia   . GERD (gastroesophageal reflux disease)    occ  . Headache(784.0)    migraines  . Heart palpitations    occ if get anxious- no tests  . History of kidney stones   . Hypothyroidism   . Irritable bowel syndrome (IBS)   . Seizures (HCC)    x 2 during pregnancy per pt was not diagnosed with eclampsia    Past Surgical History:  Procedure Laterality Date  . ABDOMINAL HYSTERECTOMY  1992  . BACK SURGERY     x2  . CHOLECYSTECTOMY  2009 or 2010  . HARDWARE REMOVAL  12/29/2012   Procedure: HARDWARE REMOVAL;  Surgeon: Tia Alertavid S Jones, MD;  Location: [facility];  Service: Neurosurgery;  Laterality: N/A;  Lumbar hardware extraction  . kindey  stone removal    . KNEE ARTHROSCOPY     right knee  . SPINAL CORD STIMULATOR INSERTION N/A 01/25/2015   Procedure: LUMBAR SPINAL CORD STIMULATOR INSERTION;  Surgeon: Gwynne EdingerPaul C Harkins, MD;  Location: [facility];  Service: Neurosurgery;  Laterality: N/A;  . TUBAL LIGATION     Family History:  Family History  Problem Relation Age of Onset  . Depression Mother   . Heart attack Mother   . Suicidality Father   . Heart disease Father   . Anesthesia problems Neg Hx    Family Psychiatric  History: See intake H&P for full details. Reviewed, with no updates at this time.  Social History:  History  Alcohol Use  . 0.6 oz/week  . 1 Glasses of wine per week     History  Drug Use  . Types: Marijuana    Social History   Social History  . Marital status: Divorced    Spouse name: N/A  . Number of children: N/A  . Years of education: N/A   Social History Main Topics  . Smoking status: Current Some Day Smoker    Packs/day: 0.50    Years: 38.00    Types: Cigarettes  . Smokeless tobacco: Never Used     Comment: Pt reports using an electric cigarette ~ 2 times a day  . Alcohol use 0.6 oz/week  1 Glasses of wine per week  . Drug use: Yes    Types: Marijuana  . Sexual activity: Not Currently    Birth control/ protection: Surgical   Other Topics Concern  . None   Social History Narrative  . None   Additional Social History:    Pain Medications: See home med list Prescriptions: See home med list Over the Counter: See home med list History of alcohol / drug use?: No history of alcohol / drug abuse Name of Substance 1: THC 1 - Age of First Use: 14 1 - Amount (size/oz): "1/2 to one joint" 1 - Frequency: daily for pain 1 - Duration: ongoing since 1999 1 - Last Use / Amount: 05/03/17                  Sleep: Fair  Appetite:  Fair  Current Medications: Current Facility-Administered Medications  Medication Dose Route Frequency Provider Last Rate Last Dose  .  acetaminophen (TYLENOL) tablet 650 mg  650 mg Oral Q6H PRN Kerry Hough, PA-C   650 mg at 05/08/17 1151  . albuterol (PROVENTIL HFA;VENTOLIN HFA) 108 (90 Base) MCG/ACT inhaler 1-2 puff  1-2 puff Inhalation Q6H PRN Kerry Hough, PA-C      . alum & mag hydroxide-simeth (MAALOX/MYLANTA) 200-200-20 MG/5ML suspension 30 mL  30 mL Oral Q4H PRN Donell Sievert E, PA-C   30 mL at 05/12/17 0837  . baclofen (LIORESAL) tablet 10 mg  10 mg Oral Q12H PRN Withrow, John C, FNP      . carbamazepine (TEGRETOL) tablet 200 mg  200 mg Oral QHS Donell Sievert E, PA-C   200 mg at 05/19/17 2218  . cholecalciferol (VITAMIN D) tablet 1,000 Units  1,000 Units Oral Daily Beau Fanny, FNP   1,000 Units at 05/20/17 0825  . diazepam (VALIUM) tablet 5 mg  5 mg Oral BID PRN Cobos, Rockey Situ, MD   5 mg at 05/19/17 2030  . docusate sodium (COLACE) capsule 100 mg  100 mg Oral Daily Oneta Rack, NP   100 mg at 05/20/17 0825  . gabapentin (NEURONTIN) capsule 800 mg  800 mg Oral TID Beau Fanny, FNP   800 mg at 05/20/17 0825  . ibuprofen (ADVIL,MOTRIN) tablet 600 mg  600 mg Oral Q6H PRN Oneta Rack, NP   600 mg at 05/18/17 2221  . levothyroxine (SYNTHROID, LEVOTHROID) tablet 25 mcg  25 mcg Oral QAC breakfast Kerry Hough, PA-C   25 mcg at 05/20/17 1610  . lidocaine (LIDODERM) 5 % 1 patch  1 patch Transdermal Q24H Kerry Hough, PA-C   1 patch at 05/20/17 0827  . magnesium hydroxide (MILK OF MAGNESIA) suspension 30 mL  30 mL Oral Daily PRN Kerry Hough, PA-C   30 mL at 05/19/17 0842  . morphine (MS CONTIN) 12 hr tablet 15 mg  15 mg Oral Q12H Nwoko, Agnes I, NP   15 mg at 05/20/17 0825  . polyethylene glycol (MIRALAX / GLYCOLAX) packet 17 g  17 g Oral Daily PRN Kerry Hough, PA-C   17 g at 05/05/17 2135  . prazosin (MINIPRESS) capsule 2 mg  2 mg Oral QHS Kerry Hough, PA-C   2 mg at 05/19/17 2217  . venlafaxine XR (EFFEXOR-XR) 24 hr capsule 150 mg  150 mg Oral Q breakfast Kerry Hough, PA-C    150 mg at 05/20/17 9604    Lab Results: No results found for this or any previous visit (  from the past 48 hour(s)).  Blood Alcohol level:  Lab Results  Component Value Date   ETH <5 05/04/2017   ETH <5 03/23/2017    Metabolic Disorder Labs: Lab Results  Component Value Date   HGBA1C 5.2 03/24/2017   MPG 103 03/24/2017   No results found for: PROLACTIN Lab Results  Component Value Date   CHOL 173 03/24/2017   TRIG 139 03/24/2017   HDL 53 03/24/2017   CHOLHDL 3.3 03/24/2017   VLDL 28 03/24/2017   LDLCALC 92 03/24/2017    Physical Findings: AIMS: Facial and Oral Movements Muscles of Facial Expression: None, normal Lips and Perioral Area: None, normal Jaw: None, normal Tongue: None, normal,Extremity Movements Upper (arms, wrists, hands, fingers): None, normal Lower (legs, knees, ankles, toes): None, normal, Trunk Movements Neck, shoulders, hips: None, normal, Overall Severity Severity of abnormal movements (highest score from questions above): None, normal Incapacitation due to abnormal movements: None, normal Patient's awareness of abnormal movements (rate only patient's report): No Awareness, Dental Status Current problems with teeth and/or dentures?: No Does patient usually wear dentures?: No  CIWA:    COWS:  COWS Total Score: 0  Musculoskeletal: Strength & Muscle Tone: within normal limits Gait & Station: normal Patient leans: N/A  Psychiatric Specialty Exam: Physical Exam  ROS  Blood pressure 106/61, pulse 79, temperature 97.9 F (36.6 C), temperature source Oral, resp. rate 18, height 5\' 4"  (1.626 m), weight 62.6 kg (138 lb), SpO2 99 %.Body mass index is 23.69 kg/m.  General Appearance: Casual and Fairly Groomed  Eye Contact:  Fair  Speech:  Garbled and Normal Rate  Volume:  Decreased  Mood:  Anxious, Depressed and Dysphoric  Affect:  Tearful  Thought Process:  Coherent  Orientation:  Full (Time, Place, and Person)  Thought Content:  Logical   Suicidal Thoughts:  No  Homicidal Thoughts:  No  Memory:  Immediate;   Fair  Judgement:  Fair  Insight:  Shallow  Psychomotor Activity:  Normal  Concentration:  Concentration: Fair  Recall:  Negative  Fund of Knowledge:  Good  Language:  Good  Akathisia:  Negative  Handed:  Right  AIMS (if indicated):     Assets:  Communication Skills Desire for Improvement  ADL's:  Intact  Cognition:  WNL  Sleep:  Number of Hours: 5.5    Treatment Plan Summary: Daily contact with patient to assess and evaluate symptoms and progress in treatment, Medication management and Plan Taper morphine per patient's request  Given that morphine MS Contin cannot be tapered by dose, we will taper by time, extending time between doses to 16 hours, then 20 hours, then 24 hours Continue Effexor, Valium, prazosin at the current doses while we Taper morphine Continue to engage in daily individual and group therapies Disposition planning with social work, patient does not feel safe to return to her current living situation  Burnard Leigh, MD 05/20/2017, 10:21 AM

## 2017-05-20 NOTE — Progress Notes (Signed)
  [facility] :  Will you be returning to the same living situation after discharge:  Yes,  pt returning home. At discharge, do you have transportation home?: Yes,  pt has access to transportation. Do you have the ability to pay for your medications: Yes,  pt has insurance.  Release of information consent forms completed and in the chart;  Patient's signature needed at discharge.  Patient to Follow up at: Follow-up Information    Milagros EvenerKaur, Rupinder, MD Follow up on 05/24/2017.   Specialty:  Psychiatry Why:  Medication management appointment at 10:30am with Dr. Evelene CroonKaur. Therapy appointment  with Britta MccreedyBarbara same day (6/4) at 11am. Contact information: [address] [address] [place] [address] [phone]           Next level [facility] care provider [facility]no  Safety Planning and Suicide Prevention discussed: Yes,  with pt.  Have you used any form of tobacco in the last 30 days? (Cigarettes, Smokeless Tobacco, Cigars, and/or Pipes): Yes  Has patient been referred to the Quitline?: Patient refused referral  Patient has been referred for addiction treatment: Yes  Jonathon JordanLynn B Anya Murphey, MSW, LCSWA 05/20/2017, 11:19 AM

## 2017-05-20 NOTE — [facility] Group Notes (Signed)
[facility] LCSW Group Therapy 05/20/2017 1:15pm  Type of Therapy: Group Therapy- [facility]  Participation Level: Pt invited. Did not attend.   Jonathon JordanLynn B Jacolyn Joaquin, MSW, LCSWA 05/20/2017 4:03 PM

## 2017-05-20 NOTE — Progress Notes (Signed)
Pt did not attend wrap up karaoke group. Pt remained in her room. 05/20/17  9:57 PM

## 2017-05-20 NOTE — Progress Notes (Signed)
Pt in room isolating and reading.  Pt refuses to go to dayroom or to [place].  Pt is tearful and complains about "my bastard husband who is too lazy to do anything for me".  Pt is wanting the remote for her tens unit and husband wont look for it.  Pt alternates between crying and being angry.  Pt sts that her only friend here was her roommate and she d/cd today and pt does not want another roommate. Pt denies SI, HI and AVH.  Pt contracts for safety and is med compliant. Pt offered encouragement and support. Pt remains safe on unit.

## 2017-05-20 NOTE — Progress Notes (Signed)
Adult Psychoeducational Group Note  Date:  05/20/2017 Time: 10:00 Group Topic/Focus:  Making Healthy Choices:   The focus of this group is to help patients identify negative/unhealthy choices they were using prior to admission and identify positive/healthier coping strategies to replace them upon discharge.  Participation Level:  Minimal  Participation Quality:  Resistant  Affect:  Angry, Anxious and Irritable  Cognitive:  Lacking  Insight: Lacking  Engagement in Group:  Lacking  Modes of Intervention:  Discussion, Education and Exploration    [name] 05/20/2017, 3:34 PM
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
## 2017-05-21 MED ORDER — IBUPROFEN 600 MG PO TABS: 600.0000 mg | ORAL_TABLET | Freq: Four times a day (QID) | ORAL | 0 refills | Status: DC | PRN

## 2017-05-21 MED ORDER — VITAMIN D3 25 MCG (1000 UNIT) PO TABS: 1000.0000 [IU] | ORAL_TABLET | Freq: Every day | ORAL | 0 refills | Status: DC

## 2017-05-21 MED ORDER — DIAZEPAM 5 MG PO TABS
5.0000 mg | ORAL_TABLET | Freq: Every day | ORAL | Status: AC
  Administered 2017-05-22 – 2017-05-23 (×2): 5 mg via ORAL
  Filled 2017-05-21 (×2): qty 1

## 2017-05-21 MED ORDER — MORPHINE SULFATE ER 15 MG PO TBCR: 15.0000 mg | EXTENDED_RELEASE_TABLET | ORAL | 0 refills | Status: DC

## 2017-05-21 MED ORDER — POLYETHYLENE GLYCOL 3350 17 G PO PACK: 17.0000 g | PACK | Freq: Every day | ORAL | 0 refills | Status: DC | PRN

## 2017-05-21 MED ORDER — LEVOTHYROXINE SODIUM 25 MCG PO TABS: 25.0000 ug | ORAL_TABLET | Freq: Every day | ORAL | 0 refills | Status: DC

## 2017-05-21 MED ORDER — VENLAFAXINE HCL ER 150 MG PO CP24: 150.0000 mg | ORAL_CAPSULE | Freq: Every day | ORAL | 0 refills | Status: DC

## 2017-05-21 MED ORDER — VENLAFAXINE HCL ER 75 MG PO CP24
225.0000 mg | ORAL_CAPSULE | Freq: Every day | ORAL | Status: DC
  Administered 2017-05-22 – 2017-05-23 (×2): 225 mg via ORAL
  Filled 2017-05-21: qty 3
  Filled 2017-05-21 (×3): qty 21

## 2017-05-21 MED ORDER — ALBUTEROL SULFATE HFA 108 (90 BASE) MCG/ACT IN AERS: INHALATION_SPRAY | RESPIRATORY_TRACT | Status: DC

## 2017-05-21 MED ORDER — GABAPENTIN 400 MG PO CAPS: 800.0000 mg | ORAL_CAPSULE | Freq: Three times a day (TID) | ORAL | 0 refills | Status: DC

## 2017-05-21 MED ORDER — LIDOCAINE 5 % EX PTCH: 1.0000 | MEDICATED_PATCH | CUTANEOUS | Status: DC

## 2017-05-21 MED ORDER — GABAPENTIN 300 MG PO CAPS
900.0000 mg | ORAL_CAPSULE | Freq: Three times a day (TID) | ORAL | Status: DC
  Administered 2017-05-21 – 2017-05-23 (×6): 900 mg via ORAL
  Filled 2017-05-21 (×5): qty 63
  Filled 2017-05-21: qty 3
  Filled 2017-05-21: qty 63
  Filled 2017-05-21: qty 3
  Filled 2017-05-21 (×2): qty 63
  Filled 2017-05-21: qty 3
  Filled 2017-05-21: qty 63

## 2017-05-21 MED ORDER — DOCUSATE SODIUM 100 MG PO CAPS: 100.0000 mg | ORAL_CAPSULE | Freq: Every day | ORAL | 0 refills | Status: DC

## 2017-05-21 MED ORDER — BACLOFEN 10 MG PO TABS: 10.0000 mg | ORAL_TABLET | Freq: Two times a day (BID) | ORAL | 0 refills | Status: DC | PRN

## 2017-05-21 MED ORDER — GABAPENTIN 800 MG PO TABS
800.0000 mg | ORAL_TABLET | Freq: Three times a day (TID) | ORAL | Status: DC
  Filled 2017-05-21 (×3): qty 21

## 2017-05-21 MED ORDER — MORPHINE SULFATE ER 15 MG PO TBCR
15.0000 mg | EXTENDED_RELEASE_TABLET | Freq: Every day | ORAL | Status: DC
  Administered 2017-05-22 – 2017-05-23 (×2): 15 mg via ORAL
  Filled 2017-05-21 (×2): qty 1

## 2017-05-21 MED ORDER — PRAZOSIN HCL 2 MG PO CAPS: 2.0000 mg | ORAL_CAPSULE | Freq: Every day | ORAL | 0 refills | Status: DC

## 2017-05-21 MED ORDER — DIAZEPAM 5 MG PO TABS: 5.0000 mg | ORAL_TABLET | Freq: Two times a day (BID) | ORAL | 0 refills | Status: DC | PRN

## 2017-05-21 MED ORDER — LIDOCAINE 5 % EX PTCH: 1.0000 | MEDICATED_PATCH | CUTANEOUS | 0 refills | Status: DC

## 2017-05-21 MED ORDER — CARBAMAZEPINE 200 MG PO TABS: 200.0000 mg | ORAL_TABLET | Freq: Every day | ORAL | 0 refills | Status: DC

## 2017-05-21 NOTE — Progress Notes (Signed)
Recreation Therapy Notes  Date:  05/21/17 Time: 0930 Location: [address] Dayroom  Group Topic: Stress Management  Goal Area(s) Addresses:  Patient will verbalize importance of using healthy stress management.  Patient will identify positive emotions associated with healthy stress management.   Behavioral Response: Engaged  Intervention: Stress Management  Activity :  Guided Imagery.  LRT introduced the stress management technique of guided imagery.  LRT read Galloway script to allow patients to engage in the activity.  Patients were to follow along with as LRT read the script to participate in the activity.  Education:  Stress Management, Discharge Planning.   Education Outcome: Acknowledges edcuation/In group clarification offered/Needs additional education  Clinical Observations/Feedback: Pt attended group.   Katelyn Galloway, LRT/CTRS         Katelyn Galloway 05/21/2017 11:21 AM 

## 2017-05-21 NOTE — [facility] Group Notes (Signed)
[facility] LCSW Group Therapy 05/21/2017 1:15pm  Type of Therapy: Group Therapy- Feelings Around Relapse and Recovery  Participation Level: Active   Participation Quality:  Appropriate  Affect:  Appropriate  Cognitive: Alert and Oriented   Insight:  Developing   Engagement in Therapy: Developing/Improving and Engaged   Modes of Intervention: Clarification, Confrontation, Discussion, Education, Exploration, Limit-setting, Orientation, Problem-solving, Rapport Building, Reality Testing, Socialization and Support  Summary of Progress/Problems: The topic for today was feelings about relapse. The group discussed what relapse prevention is to them and identified triggers that they are on the path to relapse. Members also processed their feeling towards relapse and were able to relate to common experiences. Group also discussed coping skills that can be used for relapse prevention.     Therapeutic Modalities:   Cognitive Behavioral Therapy Solution-Focused Therapy Assertiveness Training Relapse Prevention Therapy    Brahim Dolman B Eliav Mechling, MSW, LCSWA [phone] 05/21/2017 4:04 PM  

## 2017-05-21 NOTE — Progress Notes (Signed)
  [facility] :  Will you be returning to the same living situation after discharge:  Yes,  pt returning home. At discharge, do you have transportation home?: Yes,  pt has access to transportation. Do you have the ability to pay for your medications: Yes,  prescriptions and samples provided.  Release of information consent forms completed and in the chart;  Patient's signature needed at discharge.  Patient to Follow up at: Follow-up Information    Milagros EvenerKaur, Rupinder, MD Follow up on 05/24/2017.   Specialty:  Psychiatry Why:  Medication management appointment at 10:30am with Dr. Evelene CroonKaur. Therapy appointment  with Britta MccreedyBarbara same day (6/4) at 11am. Contact information: [address] [address] [place] [id number] [phone]           Next level of care provider has [facility] Link:no  Safety Planning and Suicide Prevention discussed: Yes,  with pt.  Have you used any form of tobacco in the last 30 days? (Cigarettes, Smokeless Tobacco, Cigars, and/or Pipes): Yes  Has patient been referred to the Quitline?: Patient refused referral  Patient has been referred for addiction treatment: Yes  Jonathon JordanLynn B Jariya Reichow, MSW, LCSWA 05/21/2017, 9:38 AM

## 2017-05-21 NOTE — Progress Notes (Signed)
Patient attended group and said that her day was a 4. She was glad that her physician was had taken her off morphine.

## 2017-05-21 NOTE — Tx Team (Signed)
Interdisciplinary Treatment and Diagnostic Plan Update  05/21/2017 Time of Session: 9:30am Katelyn Galloway MRN: [id number]  Principal Diagnosis: Bipolar disorder with current episode depressed (HCC)  Secondary Diagnoses: Principal Problem:   Bipolar disorder with current episode depressed (HCC) Active Problems:   Chronic bilateral low back pain   GAD (generalized anxiety disorder)   Current Medications:  Current Facility-Administered Medications  Medication Dose Route Frequency Provider Last Rate Last Dose  . acetaminophen (TYLENOL) tablet 650 mg  650 mg Oral Q6H PRN Kerry Hough, PA-C   650 mg at 05/08/17 1151  . albuterol (PROVENTIL HFA;VENTOLIN HFA) 108 (90 Base) MCG/ACT inhaler 1-2 puff  1-2 puff Inhalation Q6H PRN Kerry Hough, PA-C      . alum & mag hydroxide-simeth (MAALOX/MYLANTA) 200-200-20 MG/5ML suspension 30 mL  30 mL Oral Q4H PRN Donell Sievert E, PA-C   30 mL at 05/12/17 0837  . baclofen (LIORESAL) tablet 10 mg  10 mg Oral Q12H PRN Withrow, John C, FNP      . carbamazepine (TEGRETOL) tablet 200 mg  200 mg Oral QHS Donell Sievert E, PA-C   200 mg at 05/20/17 2210  . cholecalciferol (VITAMIN D) tablet 1,000 Units  1,000 Units Oral Daily Beau Fanny, FNP   1,000 Units at 05/21/17 4098  . diazepam (VALIUM) tablet 5 mg  5 mg Oral BID PRN Cobos, Rockey Situ, MD   5 mg at 05/21/17 0012  . docusate sodium (COLACE) capsule 100 mg  100 mg Oral Daily Oneta Rack, NP   100 mg at 05/21/17 0806  . gabapentin (NEURONTIN) capsule 800 mg  800 mg Oral TID Beau Fanny, FNP   800 mg at 05/21/17 0806  . ibuprofen (ADVIL,MOTRIN) tablet 600 mg  600 mg Oral Q6H PRN Oneta Rack, NP   600 mg at 05/21/17 0810  . levothyroxine (SYNTHROID, LEVOTHROID) tablet 25 mcg  25 mcg Oral QAC breakfast Kerry Hough, PA-C   25 mcg at 05/21/17 1191  . lidocaine (LIDODERM) 5 % 1 patch  1 patch Transdermal Q24H Kerry Hough, PA-C   1 patch at 05/21/17 4782  . magnesium hydroxide (MILK  OF MAGNESIA) suspension 30 mL  30 mL Oral Daily PRN Kerry Hough, PA-C   30 mL at 05/19/17 0842  . morphine (MS CONTIN) 12 hr tablet 15 mg  15 mg Oral Q16H Burnard Leigh, MD   15 mg at 05/21/17 0012  . polyethylene glycol (MIRALAX / GLYCOLAX) packet 17 g  17 g Oral Daily PRN Kerry Hough, PA-C   17 g at 05/05/17 2135  . prazosin (MINIPRESS) capsule 2 mg  2 mg Oral QHS Donell Sievert E, PA-C   2 mg at 05/20/17 2210  . venlafaxine XR (EFFEXOR-XR) 24 hr capsule 150 mg  150 mg Oral Q breakfast Kerry Hough, PA-C   150 mg at 05/21/17 9562    PTA Medications: Prescriptions Prior to Admission  Medication Sig Dispense Refill Last Dose  . albuterol (PROVENTIL HFA;VENTOLIN HFA) 108 (90 Base) MCG/ACT inhaler INHALE TWO PUFFS BY MOUTH EVERY 6 HOURS AS NEEDED FOR WHEEZING OR FOR SHORTNESS OF BREATH   Past Month at Unknown time  . carbamazepine (TEGRETOL) 200 MG tablet Take 200 mg by mouth at bedtime.    Past Week at Unknown time  . carisoprodol (SOMA) 350 MG tablet Take 0.5 tablets (175 mg total) by mouth 2 (two) times daily. spasm (Patient taking differently: Take 350 mg by mouth 2 (two)  times daily. spasm) 1 tablet 0 05/04/2017 at Unknown time  . Cholecalciferol (VITAMIN D3) 5000 units CAPS Take by mouth.   05/04/2017 at Unknown time  . diazepam (VALIUM) 5 MG tablet Take 1 tablet (5 mg total) by mouth every 12 (twelve) hours as needed for anxiety. (Patient taking differently: Take 10 mg by mouth every 12 (twelve) hours as needed for anxiety. ) 1 tablet 0 05/04/2017 at Unknown time  . gabapentin (NEURONTIN) 600 MG tablet Take 600 mg by mouth 3 (three) times daily.    05/04/2017 at Unknown time  . levothyroxine (SYNTHROID, LEVOTHROID) 25 MCG tablet Take 25 mcg by mouth daily before breakfast.    05/04/2017 at Unknown time  . lidocaine (LIDODERM) 5 % Place 1 patch onto the skin daily. Remove & Discard patch within 12 hours or as directed by MD 30 patch 0 Past Week at Unknown time  . lidocaine  (LIDODERM) 5 % Place onto the skin.     . meloxicam (MOBIC) 15 MG tablet Take 15 mg by mouth daily.    05/04/2017 at Unknown time  . Morphine Sulfate ER 15 MG TBEA Take 15 mg by mouth 2 (two) times daily as needed. Pain   05/04/2017 at Unknown time  . prazosin (MINIPRESS) 1 MG capsule Take 2 mg by mouth at bedtime.    Past Week at Unknown time  . prazosin (MINIPRESS) 1 MG capsule Take by mouth.     . venlafaxine XR (EFFEXOR-XR) 150 MG 24 hr capsule Take 1 capsule (150 mg total) by mouth daily with breakfast. 30 capsule 0 05/04/2017 at Unknown time  . venlafaxine XR (EFFEXOR-XR) 150 MG 24 hr capsule Take by mouth.     . Polyvinyl Alcohol-Povidone PF 1.4-0.6 % SOLN Apply to eye.       Treatment Modalities: Medication Management, Group therapy, Case management,  1 to 1 session with clinician, Psychoeducation, Recreational therapy.  Patient Stressors: Financial difficulties Health problems Marital or family conflict  Patient Strengths: Average or above average intelligence Capable of independent living General fund of knowledge Motivation for treatment/growth  Physician Treatment Plan for Primary Diagnosis: Bipolar disorder with current episode depressed (HCC) Long Term Goal(s): Improvement in symptoms so as ready for discharge  Short Term Goals: Ability to identify changes in lifestyle to reduce recurrence of condition will improve Ability to verbalize feelings will improve Ability to disclose and discuss suicidal ideas Ability to identify and develop effective coping behaviors will improve Compliance with prescribed medications will improve Ability to identify triggers associated with substance abuse/mental health issues will improve  Medication Management: Evaluate patient's response, side effects, and tolerance of medication regimen.  Therapeutic Interventions: 1 to 1 sessions, Unit Group sessions and Medication administration.  Evaluation of Outcomes: Adequate for  Discharge  Physician Treatment Plan for Secondary Diagnosis: Principal Problem:   Bipolar disorder with current episode depressed (HCC) Active Problems:   Chronic bilateral low back pain   GAD (generalized anxiety disorder)   Long Term Goal(s): Improvement in symptoms so as ready for discharge  Short Term Goals: Ability to identify changes in lifestyle to reduce recurrence of condition will improve Ability to verbalize feelings will improve Ability to disclose and discuss suicidal ideas Ability to identify and develop effective coping behaviors will improve Compliance with prescribed medications will improve Ability to identify triggers associated with substance abuse/mental health issues will improve  Medication Management: Evaluate patient's response, side effects, and tolerance of medication regimen.  Therapeutic Interventions: 1 to 1 sessions, Unit Group  sessions and Medication administration.  Evaluation of Outcomes: Adequate for Discharge   RN Treatment Plan for Primary Diagnosis: Bipolar disorder with current episode depressed (HCC) Long Term Goal(s): Knowledge of disease and therapeutic regimen to maintain health will improve  Short Term Goals: Ability to verbalize feelings will improve, Ability to disclose and discuss suicidal ideas and Ability to identify and develop effective coping behaviors will improve  Medication Management: RN will administer medications as ordered by provider, will assess and evaluate patient's response and provide education to patient for prescribed medication. RN will report any adverse and/or side effects to prescribing provider.  Therapeutic Interventions: 1 on 1 counseling sessions, Psychoeducation, Medication administration, Evaluate responses to treatment, Monitor vital signs and CBGs as ordered, Perform/monitor CIWA, COWS, AIMS and Fall Risk screenings as ordered, Perform wound care treatments as ordered.  Evaluation of Outcomes: Adequate  for Discharge   LCSW Treatment Plan for Primary Diagnosis: Bipolar disorder with current episode depressed (HCC) Long Term Goal(s): Safe transition to appropriate next level of care at discharge, Engage patient in therapeutic group addressing interpersonal concerns.  Short Term Goals: Engage patient in aftercare planning with referrals and resources, Identify triggers associated with mental health/substance abuse issues and Increase skills for wellness and recovery  Therapeutic Interventions: Assess for all discharge needs, 1 to 1 time with Social worker, Explore available resources and support systems, Assess for adequacy in community support network, Educate family and significant other(s) on suicide prevention, Complete Psychosocial Assessment, Interpersonal group therapy.  Evaluation of Outcomes: Adequate for Discharge on Monday, as she still requires some modifications to discharge plans and disposition  Progress in Treatment: Attending groups: Yes  Participating in groups:Yes  Taking medication as prescribed: Yes, MD continues to assess for medication changes as needed Toleration medication: Yes, no side effects reported at this time Family/Significant other contact made: No, CSW attempted 2x to contact neighbor Patient understands diagnosis: Developing insight Discussing patient identified problems/goals with staff: Yes Medical problems stabilized or resolved: Yes Denies suicidal/homicidal ideation: Endorsing passive SI. However, this is chronic for pt and can be managed on an outpatient basis. Issues/concerns per patient self-inventory: None Other: N/A  New problem(s) identified: None identified at this time.   New Short Term/Long Term Goal(s): None identified at this time.   Discharge Plan or Barriers: Pt will return home and follow-up with outpatient services with [facility]  Reason for Continuation of Hospitalization: None identified at this  time.  Estimated Length of Stay: 0 days  Attendees: Patient: 05/05/2017  9:40 AM  Physician: Dr. Rene Kocher 05/05/2017  9:40 AM  Nursing: Boyd Kerbs RN; [place], RN 05/05/2017  9:40 AM  RN Care Manager:  05/05/2017  9:40 AM  Social Worker: Donnelly Stager, LCSWA;  05/05/2017  9:40 AM  Recreational Therapist:  05/05/2017  9:40 AM  Other: 05/05/2017  9:40 AM  Other:  05/05/2017  9:40 AM  Other: 05/05/2017  9:40 AM    Scribe for Treatment Team: Jonathon Jordan, MSW, LCSWA 05/21/17 9:40 AM

## 2017-05-21 NOTE — Progress Notes (Addendum)
[facility] MD Progress Note  05/21/2017 2:18 PM Katelyn Galloway  MRN:  [id number] Subjective:  Notably, we had been anticipating discharge, however this is not possible presently given active SI at the anxiety of discharging without safe housing plan   Katelyn Galloway has tolerated morphine taper without issue.  She wishes to continue tapering. Denies any SI in hospital, but reports that thoughts of leaving make her want to kill herself.  We agreed that she should take the weekend to work on coping strategies and she is going to reach out to her brother and her niece.  She feels that they may let her stay with them, which would reduce her anxiety and fear about leaving.    She continues to have restless sleep, agrees to increase in prazosin.  Principal Problem: Bipolar disorder with current episode depressed (HCC) Diagnosis:   Patient Active Problem List   Diagnosis Date Noted  . Bipolar disorder with current episode depressed (HCC) [F31.30] 05/05/2017  . 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 Asthma [J45.909] 03/24/2017  . Tobacco use disorder [F17.200] 03/24/2017  . Cannabis abuse [F12.10] 03/23/2017  . Hypothyroidism [E03.9] 05/15/2015  . GAD (generalized anxiety disorder) [F41.1] 07/10/2010  . Chronic bilateral low back pain [M54.5, G89.29] 07/09/2009   Total Time spent with patient: 15 minutes  Past Psychiatric History: See intake H&P for full details. Reviewed, with no updates at this time.   Past Medical History:  Past Medical History:  Diagnosis Date  . Anemia 1970  . Anxiety   . Arthritis   . Asthma    child  . Bronchitis    "years ago"  . Bronchitis    chronic hx  . Chronic back pain    hx of  . Depression   . Fibromyalgia   . GERD (gastroesophageal reflux disease)    occ  . Headache(784.0)    migraines  . Heart palpitations    occ if get anxious- no tests  . History of kidney stones   . Hypothyroidism   . Irritable bowel  syndrome (IBS)   . Seizures (HCC)    x 2 during pregnancy per pt was not diagnosed with eclampsia    Past Surgical History:  Procedure Laterality Date  . ABDOMINAL HYSTERECTOMY  1992  . BACK SURGERY     x2  . CHOLECYSTECTOMY  2009 or 2010  . HARDWARE REMOVAL  12/29/2012   Procedure: HARDWARE REMOVAL;  Surgeon: Tia Alert, MD;  Location: [facility];  Service: Neurosurgery;  Laterality: N/A;  Lumbar hardware extraction  . kindey stone removal    . KNEE ARTHROSCOPY     right knee  . SPINAL CORD STIMULATOR INSERTION N/A 01/25/2015   Procedure: LUMBAR SPINAL CORD STIMULATOR INSERTION;  Surgeon: Gwynne Edinger, MD;  Location: [facility];  Service: Neurosurgery;  Laterality: N/A;  . TUBAL LIGATION     Family History:  Family History  Problem Relation Age of Onset  . Depression Mother   . Heart attack Mother   . Suicidality Father   . Heart disease Father   . Anesthesia problems Neg Hx    Family Psychiatric  History: See intake H&P for full details. Reviewed, with no updates at this time.  Social History:  History  Alcohol Use  . 0.6 oz/week  . 1 Glasses of wine per week     History  Drug Use  . Types: Marijuana    Social History   Social History  .  Marital status: Divorced    Spouse name: N/A  . Number of children: N/A  . Years of education: N/A   Social History Main Topics  . Smoking status: Current Some Day Smoker    Packs/day: 0.50    Years: 38.00    Types: Cigarettes  . Smokeless tobacco: Never Used     Comment: Pt reports using an electric cigarette ~ 2 times a day  . Alcohol use 0.6 oz/week    1 Glasses of wine per week  . Drug use: Yes    Types: Marijuana  . Sexual activity: Not Currently    Birth control/ protection: Surgical   Other Topics Concern  . None   Social History Narrative  . None   Additional Social History:    Pain Medications: See home med list Prescriptions: See home med list Over the Counter: See home med list History of  alcohol / drug use?: No history of alcohol / drug abuse Name of Substance 1: THC 1 - Age of First Use: 14 1 - Amount (size/oz): "1/2 to one joint" 1 - Frequency: daily for pain 1 - Duration: ongoing since 1999 1 - Last Use / Amount: 05/03/17                  Sleep: Fair  Appetite:  Fair  Current Medications: Current Facility-Administered Medications  Medication Dose Route Frequency Provider Last Rate Last Dose  . acetaminophen (TYLENOL) tablet 650 mg  650 mg Oral Q6H PRN Kerry Hough, PA-C   650 mg at 05/08/17 1151  . albuterol (PROVENTIL HFA;VENTOLIN HFA) 108 (90 Base) MCG/ACT inhaler 1-2 puff  1-2 puff Inhalation Q6H PRN Kerry Hough, PA-C      . alum & mag hydroxide-simeth (MAALOX/MYLANTA) 200-200-20 MG/5ML suspension 30 mL  30 mL Oral Q4H PRN Donell Sievert E, PA-C   30 mL at 05/12/17 0837  . baclofen (LIORESAL) tablet 10 mg  10 mg Oral Q12H PRN Withrow, John C, FNP      . carbamazepine (TEGRETOL) tablet 200 mg  200 mg Oral QHS Donell Sievert E, PA-C   200 mg at 05/20/17 2210  . cholecalciferol (VITAMIN D) tablet 1,000 Units  1,000 Units Oral Daily Beau Fanny, FNP   1,000 Units at 05/21/17 4132  . diazepam (VALIUM) tablet 5 mg  5 mg Oral BID PRN Cobos, Rockey Situ, MD   5 mg at 05/21/17 1146  . docusate sodium (COLACE) capsule 100 mg  100 mg Oral Daily Oneta Rack, NP   100 mg at 05/21/17 0806  . gabapentin (NEURONTIN) capsule 800 mg  800 mg Oral TID Beau Fanny, FNP   800 mg at 05/21/17 1145  . ibuprofen (ADVIL,MOTRIN) tablet 600 mg  600 mg Oral Q6H PRN Oneta Rack, NP   600 mg at 05/21/17 0810  . levothyroxine (SYNTHROID, LEVOTHROID) tablet 25 mcg  25 mcg Oral QAC breakfast Kerry Hough, PA-C   25 mcg at 05/21/17 4401  . lidocaine (LIDODERM) 5 % 1 patch  1 patch Transdermal Q24H Kerry Hough, PA-C   1 patch at 05/21/17 0272  . magnesium hydroxide (MILK OF MAGNESIA) suspension 30 mL  30 mL Oral Daily PRN Kerry Hough, PA-C   30 mL at 05/19/17  0842  . morphine (MS CONTIN) 12 hr tablet 15 mg  15 mg Oral Q16H Burnard Leigh, MD   15 mg at 05/21/17 0012  . polyethylene glycol (MIRALAX / GLYCOLAX) packet 17 g  17 g Oral Daily PRN Kerry HoughSimon, Spencer E, PA-C   17 g at 05/05/17 2135  . prazosin (MINIPRESS) capsule 2 mg  2 mg Oral QHS Donell SievertSimon, Spencer E, PA-C   2 mg at 05/20/17 2210  . venlafaxine XR (EFFEXOR-XR) 24 hr capsule 150 mg  150 mg Oral Q breakfast Kerry HoughSimon, Spencer E, PA-C   150 mg at 05/21/17 [id number]    Lab Results: No results found for this or any previous visit (from the past 48 hour(s)).  Blood Alcohol level:  Lab Results  Component Value Date   ETH <5 05/04/2017   ETH <5 03/23/2017    Metabolic Disorder Labs: Lab Results  Component Value Date   HGBA1C 5.2 03/24/2017   MPG 103 03/24/2017   No results found for: PROLACTIN Lab Results  Component Value Date   CHOL 173 03/24/2017   TRIG 139 03/24/2017   HDL 53 03/24/2017   CHOLHDL 3.3 03/24/2017   VLDL 28 03/24/2017   LDLCALC 92 03/24/2017    Physical Findings: AIMS: Facial and Oral Movements Muscles of Facial Expression: None, normal Lips and Perioral Area: None, normal Jaw: None, normal Tongue: None, normal,Extremity Movements Upper (arms, wrists, hands, fingers): None, normal Lower (legs, knees, ankles, toes): None, normal, Trunk Movements Neck, shoulders, hips: None, normal, Overall Severity Severity of abnormal movements (highest score from questions above): None, normal Incapacitation due to abnormal movements: None, normal Patient's awareness of abnormal movements (rate only patient's report): No Awareness, Dental Status Current problems with teeth and/or dentures?: No Does patient usually wear dentures?: No  CIWA:    COWS:  COWS Total Score: 0  Musculoskeletal: Strength & Muscle Tone: within normal limits Gait & Station: normal Patient leans: N/A  Psychiatric Specialty Exam: Physical Exam  ROS  Blood pressure 105/71, pulse 71, temperature  97.9 F (36.6 C), temperature source Oral, resp. rate 18, height 5\' 4"  (1.626 m), weight 62.6 kg (138 lb), SpO2 99 %.Body mass index is 23.69 kg/m.  General Appearance: Casual and Fairly Groomed  Eye Contact:  Fair  Speech:  Garbled and Normal Rate  Volume:  Decreased  Mood:  Anxious, Depressed and Dysphoric  Affect:  Tearful  Thought Process:  Coherent  Orientation:  Full (Time, Place, and Person)  Thought Content:  Rumination  Suicidal Thoughts:  No  Homicidal Thoughts:  No  Memory:  Immediate;   Fair  Judgement:  Fair  Insight:  Shallow  Psychomotor Activity:  Normal  Concentration:  Concentration: Fair  Recall:  Negative  Fund of Knowledge:  Good  Language:  Good  Akathisia:  Negative  Handed:  Right  AIMS (if indicated):     Assets:  Communication Skills Desire for Improvement  ADL's:  Intact  Cognition:  WNL  Sleep:  Number of Hours: 4.5    Treatment Plan Summary: Katelyn Galloway is a 56 year old female with PTSD and borderline personality.  We are working on discharge planning, anticipate Monday as this will allow her to arrange for appropriate housing with her family and reduce risk for readmission or self harm behaviors.    Daily contact with patient to assess and evaluate symptoms and progress in treatment, Medication management and Plan Taper morphine per patient's request  Taper Morphine MS contin to q 24 hours Increase Effexor to 225 mg daily for pain and depression Increase prazosin to 4 mg nightly Continue Valium at the current doses while we Taper morphine Continue to engage in daily individual and group therapies Disposition planning with  social work, patient does not feel safe to return to her current living situation  Burnard Leigh, MD 05/21/2017, 2:18 PM

## 2017-05-21 NOTE — Progress Notes (Signed)
Katelyn Galloway has had a " yo-yo"-like day. She started the day off ok and then got extremely upset, became agitated, crying and threatening suicide " If you discharge me, I'm going to go home, get my affairs in order and kill myself" when her name on the unit board was flagged for discharge. After crying and staying balled up in her bed in a fetal position for 3 hours, she spoke with Katelyn Galloway, it was decided that she will stay in the hospital through the weekend, which will give her time to make a strong discharge plan. After this was settled and she was able to calm down , she got very upset, again, when she found out her previous q 16 hr prn morphine had been changed by Katelyn Galloway to QD . Katelyn Galloway spoke with her then with this writer present,  and explained to her that she had not been on valium prior to this hospitalization and that she would be weaned off valium this weekend. . A She completed her daily assessment this am and on it she wrote she rated her depression, hopelessness and anxiety " 9/9/8", respectively and she stated she had experienced SI today Artist( writer spoke with her about this statement immediately upon reading it and pt was easily agreeable to contract to not hurt self today while in hospital). Pt has stayed back on unit for both lunch and dinner, requesting she be brought back 2 salads, [place] dressing and bottled water ( this was done by staff). Pt took MOM for c/o  Constipation and stated " that's why I'm eating the salad..I'm constipated". R Pt has demonstrated no slef injurious behaviors today, although she has made verbal threats. She cont to contract with this Clinical research associatewriter for International aid/development workersafety and allows writer to Hydrologistprocess withher, to focus on her plan and goal and return tot he dayroom, when she chooses to not stay in her bed.

## 2017-05-22 MED ORDER — LIDOCAINE 5 % EX PTCH
2.0000 | MEDICATED_PATCH | CUTANEOUS | Status: DC
  Administered 2017-05-22 – 2017-05-23 (×2): 2 via TRANSDERMAL
  Filled 2017-05-22 (×4): qty 2

## 2017-05-22 NOTE — [facility] Group Notes (Signed)
Identifying Needs  Date:  05/22/2017  Time:  1300  Type of Therapy:  Nurse Education / idnetifying Needs: The group focuses on teaching patients how to identify their needs and then how to develop the skills needed to get them met.  Participation Level:  Active  Participation Quality:  Appropriate  Affect:  Appropriate  Cognitive:  Alert  Insight:  Appropriate  Engagement in Group:  Engaged  Modes of Intervention:  Education  Summary of Progress/Problems:  Katelyn Galloway 05/22/2017, 5:09 PM

## 2017-05-22 NOTE — [facility] Group Notes (Signed)
Adult Therapy Group Note (Clinical Social Work)  Date:  05/22/2017  Time:  10:00-11:00AM  Group Topic/Focus:  HEALTHY COPING SKILLS  Today's group focused on identifying healthy coping skills already in use by each patient, as well as healthy coping skills they would like to learn.  There was much sharing, support, psychoeducation, and encouragement provided.  Participation Level:  Active  Participation Quality:  Attentive, Monopolizing and Supportive  Affect:  Blunted  Cognitive:  Appropriate  Insight: Improving  Engagement in Group:  Engaged  Modes of Intervention:  Discussion, Support and Processing  Additional Comments:  The patient shared that healthy coping already used is breathing and yoga, which she stated she employs together.  Something she wants to work on is learning more "grounding" techniques as well as to learn "how to be like a duck, letting things roll off my back instead of internalizing them."  She was quite intrusive and always had something to say or a question to ask about everybody's shared items.  Carloyn JaegerMareida J Grossman-Orr 05/22/2017, 1:28 PM
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
## 2017-05-22 NOTE — Progress Notes (Addendum)
Patient attended group and said that her day was a 6. Her coping skills for today were reading and socializing.

## 2017-05-22 NOTE — Progress Notes (Signed)
Pt has met with physician earlier this am and she confirms will be discharged later today. Pt hs completed her daily assessment and on this she writes she  deneis acitve SI today and she rates her depression, hopelessness and anxiety " 5/1/6", respectively. All dc instructions are reivewed with pt by this Probation officer and pt voiced verbal understanding and willingness to comply. She is given this paperwork when her locker is unlocked and those items are returned to her per protocol. She was escorted to bldg entrance and dc'd home per poc.

## 2017-05-22 NOTE — Progress Notes (Signed)
D Katelyn BradfordKimberly is doing much better today modulating her behavior, as well as staying focused on her goals and her plans. Initially, she was very verbal this morning, about her displeasure in having to get a roommate " in the middle of the night". She was crying and agitated, no tears observed by this Clinical research associatewriter. A She completed her daily assessment this morning and on this she wrote she has experienced thoughts of self harm today but she quickly says " that's something I always think about"... and she makes a Engineer, manufacturingverbal contract with this Clinical research associatewriter, agreeing to not hurt herself while here..in the hospital and will come to this writer if she feels unable to keep this contract. She rated her depression, hopelessness and anxiety " 7/5/6", respectively and Clinical research associatewriter talked with her about these feelings still being very " high" when she goes home ( or leaves the hospital on Monday).  She cont to stumble with handling her intense feelings and being  able to separate feeling from reality. She admits to this writer " I'm not real good at that...its why I have so many problems". R Safety in place. Cont to process with pt, encourage CBT and mindfullness and offer positive reinforcement for healthy choices made.

## 2017-05-22 NOTE — Progress Notes (Signed)
Pt woke up visibly distraught , complaining that she had a roommate, pt stated she had continuous nightmares all night long.

## 2017-05-22 NOTE — Progress Notes (Signed)
[facility] MD Progress Note  05/22/2017 2:05 PM Katelyn Galloway  MRN:  [id number] Subjective: I had a bad day yesterday. I was really emotional. Thought they were going to discharge me but we came up with a plan. Im going to go home on Monday. I dont feel suicidal today it was just yesterday. I didn't talk to any one about discharging but they had my name marked and flagged so I thought I was going home. Its not me that I want to kill its the illness. I couldn't sleep last night plus they gave me a roommate. Im going to [employer] a letter to let him know how he is to me.   Per nursing:Kim has had a " yo-yo"-like day. She started the day off ok and then got extremely upset, became agitated, crying and threatening suicide " If you discharge me, I'm going to go home, get my affairs in order and kill myself" when her name on the unit board was flagged for discharge. After crying and staying balled up in her bed in a fetal position for 3 hours, she spoke with Dr. Rene Kocher, it was decided that she will stay in the hospital through the weekend, which will give her time to make a strong discharge plan. After this was settled and she was able to calm down , she got very upset, again, when she found out her previous q 16 hr prn morphine had been changed by Dr. Rene Kocher to QD . Dr. Lucianne Muss spoke with her then with this writer present,  and explained to her that she had not been on valium prior to this hospitalization and that she would be weaned off valium this weekend. . A She completed her daily assessment this am and on it she wrote she rated her depression, hopelessness and anxiety " 9/9/8", respectively and she stated she had experienced SI today Artist spoke with her about this statement immediately upon reading it and pt was easily agreeable to contract to not hurt self today while in hospital). Pt has stayed back on unit for both lunch and dinner, requesting she be brought back 2 salads, thousand island dressing and  bottled water ( this was done by staff). Pt took MOM for c/o  Constipation and stated " that's why I'm eating the salad..I'm constipated". R Pt has demonstrated no slef injurious behaviors today, although she has made verbal threats. She cont to contract with this Clinical research associate for International aid/development worker to Hydrologist, to focus on her plan and goal and return tot he dayroom, when she chooses to not stay in her bed.  Objective:    Katelyn Galloway has tolerated morphine taper without issue.  She is denying and suicidal ideation, homicidal ideation, and psychosis at this time. She is unable to identify what has changed since yesterday to today. Writer reviewed her actions with her that lead up to extending her stay and other options she has available. She reports tapering off her medications, and per chart review this was the patients decision to taper down despite multiple requests form psychiatrist to wait until she goes home.   She reports disturbance in sleep and eating at this time. SHe is rating her depression 8/10 and anxiety 6/10, on a likert scale of 0-10 with 10 being the worse. She is up and ambulating around the unit, and engaging well with her peers at this time.  Her goal today is to work on discharge planning and writing a letter  to [employer].    Principal Problem: Bipolar disorder with current episode depressed (HCC) Diagnosis:   Patient Active Problem List   Diagnosis Date Noted  . Bipolar disorder with current episode depressed (HCC) [F31.30] 05/05/2017  . 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 Asthma [J45.909] 03/24/2017  . Tobacco use disorder [F17.200] 03/24/2017  . Cannabis abuse [F12.10] 03/23/2017  . Hypothyroidism [E03.9] 05/15/2015  . GAD (generalized anxiety disorder) [F41.1] 07/10/2010  . Chronic bilateral low back pain [M54.5, G89.29] 07/09/2009   Total Time spent with patient: 15 minutes  Past Psychiatric History: See intake H&P for full  details. Reviewed, with no updates at this time.   Past Medical History:  Past Medical History:  Diagnosis Date  . Anemia 1970  . Anxiety   . Arthritis   . Asthma    child  . Bronchitis    "years ago"  . Bronchitis    chronic hx  . Chronic back pain    hx of  . Depression   . Fibromyalgia   . GERD (gastroesophageal reflux disease)    occ  . Headache(784.0)    migraines  . Heart palpitations    occ if get anxious- no tests  . History of kidney stones   . Hypothyroidism   . Irritable bowel syndrome (IBS)   . Seizures (HCC)    x 2 during pregnancy per pt was not diagnosed with eclampsia    Past Surgical History:  Procedure Laterality Date  . ABDOMINAL HYSTERECTOMY  1992  . BACK SURGERY     x2  . CHOLECYSTECTOMY  2009 or 2010  . HARDWARE REMOVAL  12/29/2012   Procedure: HARDWARE REMOVAL;  Surgeon: Tia Alertavid S Jones, MD;  Location: [facility];  Service: Neurosurgery;  Laterality: N/A;  Lumbar hardware extraction  . kindey stone removal    . KNEE ARTHROSCOPY     right knee  . SPINAL CORD STIMULATOR INSERTION N/A 01/25/2015   Procedure: LUMBAR SPINAL CORD STIMULATOR INSERTION;  Surgeon: Gwynne EdingerPaul C Harkins, MD;  Location: [facility];  Service: Neurosurgery;  Laterality: N/A;  . TUBAL LIGATION     Family History:  Family History  Problem Relation Age of Onset  . Depression Mother   . Heart attack Mother   . Suicidality Father   . Heart disease Father   . Anesthesia problems Neg Hx    Family Psychiatric  History: See intake H&P for full details. Reviewed, with no updates at this time.  Social History:  History  Alcohol Use  . 0.6 oz/week  . 1 Glasses of wine per week     History  Drug Use  . Types: Marijuana    Social History   Social History  . Marital status: Divorced    Spouse name: N/A  . Number of children: N/A  . Years of education: N/A   Social History Main Topics  . Smoking status: Current Some Day Smoker    Packs/day: 0.50    Years: 38.00     Types: Cigarettes  . Smokeless tobacco: Never Used     Comment: Pt reports using an electric cigarette ~ 2 times a day  . Alcohol use 0.6 oz/week    1 Glasses of wine per week  . Drug use: Yes    Types: Marijuana  . Sexual activity: Not Currently    Birth control/ protection: Surgical   Other Topics Concern  . None   Social History Narrative  . None   Additional  Social History:    Pain Medications: See home med list Prescriptions: See home med list Over the Counter: See home med list History of alcohol / drug use?: No history of alcohol / drug abuse Name of Substance 1: THC 1 - Age of First Use: 14 1 - Amount (size/oz): "1/2 to one joint" 1 - Frequency: daily for pain 1 - Duration: ongoing since 1999 1 - Last Use / Amount: 05/03/17                  Sleep: Fair  Appetite:  Fair  Current Medications: Current Facility-Administered Medications  Medication Dose Route Frequency Provider Last Rate Last Dose  . acetaminophen (TYLENOL) tablet 650 mg  650 mg Oral Q6H PRN Kerry Hough, PA-C   650 mg at 05/08/17 1151  . albuterol (PROVENTIL HFA;VENTOLIN HFA) 108 (90 Base) MCG/ACT inhaler 1-2 puff  1-2 puff Inhalation Q6H PRN Kerry Hough, PA-C      . alum & mag hydroxide-simeth (MAALOX/MYLANTA) 200-200-20 MG/5ML suspension 30 mL  30 mL Oral Q4H PRN Donell Sievert E, PA-C   30 mL at 05/12/17 0837  . baclofen (LIORESAL) tablet 10 mg  10 mg Oral Q12H PRN Beau Fanny, FNP   10 mg at 05/21/17 1524  . carbamazepine (TEGRETOL) tablet 200 mg  200 mg Oral QHS Donell Sievert E, PA-C   200 mg at 05/21/17 2240  . cholecalciferol (VITAMIN D) tablet 1,000 Units  1,000 Units Oral Daily Beau Fanny, FNP   1,000 Units at 05/22/17 [phone]  . diazepam (VALIUM) tablet 5 mg  5 mg Oral Daily Laveda Abbe, NP   5 mg at 05/22/17 9604  . docusate sodium (COLACE) capsule 100 mg  100 mg Oral Daily Oneta Rack, NP   100 mg at 05/22/17 0836  . gabapentin (NEURONTIN) capsule 900 mg   900 mg Oral TID Burnard Leigh, MD   900 mg at 05/22/17 1205  . ibuprofen (ADVIL,MOTRIN) tablet 600 mg  600 mg Oral Q6H PRN Oneta Rack, NP   600 mg at 05/21/17 1524  . levothyroxine (SYNTHROID, LEVOTHROID) tablet 25 mcg  25 mcg Oral QAC breakfast Kerry Hough, PA-C   25 mcg at 05/22/17 5409  . [START ON 05/23/2017] lidocaine (LIDODERM) 5 % 2 patch  2 patch Transdermal Q24H Cobos, Rockey Situ, MD   2 patch at 05/22/17 [phone]  . magnesium hydroxide (MILK OF MAGNESIA) suspension 30 mL  30 mL Oral Daily PRN Donell Sievert E, PA-C   30 mL at 05/21/17 1719  . morphine (MS CONTIN) 12 hr tablet 15 mg  15 mg Oral Daily Burnard Leigh, MD   15 mg at 05/22/17 0612  . polyethylene glycol (MIRALAX / GLYCOLAX) packet 17 g  17 g Oral Daily PRN Kerry Hough, PA-C   17 g at 05/05/17 2135  . prazosin (MINIPRESS) capsule 2 mg  2 mg Oral QHS Donell Sievert E, PA-C   2 mg at 05/21/17 2240  . venlafaxine XR (EFFEXOR-XR) 24 hr capsule 225 mg  225 mg Oral Q breakfast Burnard Leigh, MD   225 mg at 05/22/17 1478    Lab Results: No results found for this or any previous visit (from the past 48 hour(s)).  Blood Alcohol level:  Lab Results  Component Value Date   [facility] <5 05/04/2017   ETH <5 03/23/2017    Metabolic Disorder Labs: Lab Results  Component Value Date   HGBA1C 5.2 03/24/2017  MPG 103 03/24/2017   No results found for: PROLACTIN Lab Results  Component Value Date   CHOL 173 03/24/2017   TRIG 139 03/24/2017   HDL 53 03/24/2017   CHOLHDL 3.3 03/24/2017   VLDL 28 03/24/2017   LDLCALC 92 03/24/2017    Physical Findings: AIMS: Facial and Oral Movements Muscles of Facial Expression: None, normal Lips and Perioral Area: None, normal Jaw: None, normal Tongue: None, normal,Extremity Movements Upper (arms, wrists, hands, fingers): None, normal Lower (legs, knees, ankles, toes): None, normal, Trunk Movements Neck, shoulders, hips: None, normal, Overall Severity Severity  of abnormal movements (highest score from questions above): None, normal Incapacitation due to abnormal movements: None, normal Patient's awareness of abnormal movements (rate only patient's report): No Awareness, Dental Status Current problems with teeth and/or dentures?: No Does patient usually wear dentures?: No  CIWA:    COWS:  COWS Total Score: 0  Musculoskeletal: Strength & Muscle Tone: within normal limits Gait & Station: normal Patient leans: N/A  Psychiatric Specialty Exam: Physical Exam   ROS   Blood pressure 127/82, pulse 92, temperature 97.8 F (36.6 C), temperature source Oral, resp. rate 20, height 5\' 4"  (1.626 m), weight 62.6 kg (138 lb), SpO2 99 %.Body mass index is 23.69 kg/m.  General Appearance: Casual and Fairly Groomed  Eye Contact:  Fair  Speech:  Garbled and Normal Rate  Volume:  Decreased  Mood:  Anxious, Depressed and Dysphoric  Affect:  Tearful  Thought Process:  Coherent  Orientation:  Full (Time, Place, and Person)  Thought Content:  Rumination  Suicidal Thoughts:  No  Homicidal Thoughts:  No  Memory:  Immediate;   Fair  Judgement:  Fair  Insight:  Shallow  Psychomotor Activity:  Normal  Concentration:  Concentration: Fair  Recall:  Negative  Fund of Knowledge:  Good  Language:  Good  Akathisia:  Negative  Handed:  Right  AIMS (if indicated):     Assets:  Communication Skills Desire for Improvement  ADL's:  Intact  Cognition:  WNL  Sleep:  Number of Hours: 5    Treatment Plan Summary: DAEJAH KLEBBA is a 56 year old female with PTSD and borderline personality.  We are working on discharge planning, anticipate Monday as this will allow her to arrange for appropriate housing with her family and reduce risk for readmission or self harm behaviors.    Daily contact with patient to assess and evaluate symptoms and progress in treatment, Medication management and Plan Taper morphine per patient's request  Taper Morphine MS contin to q 24  hours Increase Effexor to 225 mg daily for pain and depression Increase prazosin to 4 mg nightly Continue Valium at the current doses while we Taper morphine Continue to engage in daily individual and group therapies Disposition planning with social work, patient does not feel safe to return to her current living situation  Truman Hayward, FNP 05/22/2017, 2:05 PM

## 2017-05-22 NOTE — Progress Notes (Signed)
Patient ID: Katelyn CosierKimberly G Galloway, female   DOB: 03/27/1961, 56 y.o.   MRN: [id number]  Pt currently presents with a labile affect and improved behavior. Pt is tearful when talking to writer and then seen smiling while with peers. Pt reports to writer that their goal is to "figure out what I am going to do when I go home." Pt states "I am worried about how "he" is going to feel. States that she has identified her neighbor across the street as her support person at discharge. Pt reports good sleep with current medication regimen.   Pt provided with medications per providers orders. Pt's labs and vitals were monitored throughout the night. Pt given a 1:1 about emotional and mental status. Pt supported and encouraged to express concerns and questions. Pt educated on medications and suicide safety precautions.   Pt's safety ensured with 15 minute and environmental checks. Pt currently denies SI/HI and A/V hallucinations. Pt verbally agrees to seek staff if SI/HI or A/VH occurs and to consult with staff before acting on any harmful thoughts. Will continue POC.

## 2017-05-22 NOTE — Plan of Care (Signed)
Problem: Coping: Goal: Ability to cope will improve Outcome: Progressing Pt has not acted out this evening and was seen interacting with peers or talking on the phone.

## 2017-05-23 ENCOUNTER — Encounter (HOSPITAL_COMMUNITY): Payer: Self-pay | Admitting: Psychiatry

## 2017-05-23 MED ORDER — VENLAFAXINE HCL ER 75 MG PO CP24: 225.0000 mg | ORAL_CAPSULE | Freq: Every day | ORAL | 0 refills | Status: DC

## 2017-05-23 MED ORDER — GABAPENTIN 300 MG PO CAPS: 900.0000 mg | ORAL_CAPSULE | Freq: Three times a day (TID) | ORAL | 0 refills | Status: DC

## 2017-05-23 MED ORDER — LIDOCAINE 5 % EX PTCH: 2.0000 | MEDICATED_PATCH | CUTANEOUS | 0 refills | Status: DC

## 2017-05-23 NOTE — Progress Notes (Signed)
  [facility] Discharge Plan :  Will you be returning to the same living situation after discharge:  Yes,  home with husband At discharge, do you have transportation home?: Yes,  husband to pick up Do you have the ability to pay for your medications: No.  Has a small amount of money, no insurance, referred to agencies to help  Release of information consent forms completed and in the chart;  Patient's signature needed at discharge.  Patient to Follow up at: Follow-up Information    Milagros EvenerKaur, Rupinder, MD Follow up on 05/24/2017.   Specialty:  Psychiatry Why:  Medication management appointment at 10:30am with Dr. Evelene CroonKaur. Therapy appointment  with Britta MccreedyBarbara same day (6/4) at 11am. Contact information: [address] [address] [place] [address] [phone]        [facility] [place]. Call.   Why:  Please call to arrange your orientation to the [facility] services and the [facility].  At this organization, you will be able to access support groups for yourself and your loved ones.  No record of your hospital stay will be sent to [facility]. Contact information: [address] [place]  [id number] [phone]       [facility] Follow up.   Specialty:  Behavioral Health Why:  Research this agency to decide if you would like to go here for services.  Walk-In Clinic is open Monday-Friday 8:00am-3:00pm for a full assessment and recommendations regarding medication management, group therapy and/or individual therapy. Contact information: [address] [place] [address] [phone]        [facility] Follow up.   Specialty:  Professional Counselor Why:  Research this agency to decide if you would like to go here for services.  Call to schedule a full assessment for recommendations regarding medication management, group therapy and/or individual therapy. Contact information: [facility] [address] [place] [address] [phone]           Next level of care provider has access to [facility] Link:no  Safety Planning and Suicide Prevention discussed: Yes,  with patient  Have you used any form of tobacco in the last 30 days? (Cigarettes, Smokeless Tobacco, Cigars, and/or Pipes): Yes  Has patient been referred to the Quitline?: Patient refused referral  Patient has been referred for addiction treatment: Yes  [name] 05/23/2017, 10:49 AM
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
## 2017-05-23 NOTE — [facility] Group Notes (Signed)
[facility] Group Notes: (Clinical Social Work)   05/23/2017      Type of Therapy:  Group Therapy   Participation Level:  Did Not Attend - discharged just before group   Ambrose MantleMareida Grossman-Orr, LCSW 05/23/2017, 4:34 PM

## 2017-05-23 NOTE — [facility] Suicide Risk Assessment (Signed)
[facility]   Principal Problem: Bipolar disorder with current episode depressed [facility](HCC) Discharge Diagnoses:  Patient Active Problem List   Diagnosis Date Noted  . Bipolar disorder with current episode depressed (HCC) [F31.30] 05/05/2017  . 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 Asthma [J45.909] 03/24/2017  . Tobacco use disorder [F17.200] 03/24/2017  . Cannabis abuse [F12.10] 03/23/2017  . Hypothyroidism [E03.9] 05/15/2015  . GAD (generalized anxiety disorder) [F41.1] 07/10/2010  . Chronic bilateral low back pain [M54.5, G89.29] 07/09/2009    Total Time spent with patient: 30 minutes  Musculoskeletal: Strength & Muscle Tone: within normal limits Gait & Station: normal Patient leans: N/A  Psychiatric Specialty Exam: Review of Systems  Psychiatric/Behavioral: Negative for depression and suicidal ideas. The patient is nervous/anxious (improving).   All other systems reviewed and are negative.   Blood pressure 112/77, pulse 82, temperature 98 F (36.7 C), temperature source Oral, resp. rate 18, height 5\' 4"  (1.626 m), weight 62.6 kg (138 lb), SpO2 99 %.Body mass index is 23.69 kg/m.  General Appearance: Casual  Eye Contact::  Fair  Speech:  Clear and Coherent409  Volume:  Normal  Mood:  Euthymic  Affect:  Appropriate  Thought Process:  Goal Directed and Descriptions of Associations: Intact  Orientation:  Full (Time, Place, and Person)  Thought Content:  Logical  Suicidal Thoughts:  No  Homicidal Thoughts:  No  Memory:  Immediate;   Fair Recent;   Fair Remote;   Fair  Judgement:  Fair  Insight:  Fair  Psychomotor Activity:  Normal  Concentration:  Fair  Recall:  FiservFair  Fund of Knowledge:Fair  Language: Fair  Akathisia:  No  Handed:  Right  AIMS (if indicated):     Assets:  Communication Skills Desire for Improvement  Sleep:  Number of Hours: 4.75  Cognition: WNL  ADL's:  Intact   Mental Status Per Nursing  Assessment::   On Admission:  Self-harm thoughts  Demographic Factors:  Caucasian  Loss Factors: NA  Historical Factors: Impulsivity  Risk Reduction Factors:   Living with another person, especially a relative, Positive social support and Positive therapeutic relationship  Continued Clinical Symptoms:  Previous Psychiatric Diagnoses and Treatments  Cognitive Features That Contribute To Risk:  Polarized thinking    Suicide Risk:  Minimal: No identifiable suicidal ideation.  Patients presenting with no risk factors but with morbid ruminations; may be classified as minimal risk based on the severity of the depressive symptoms  Follow-up Information    Milagros EvenerKaur, Rupinder, MD Follow up on 05/24/2017.   Specialty:  Psychiatry Why:  Medication management appointment at 10:30am with Dr. Evelene CroonKaur. Therapy appointment  with Britta MccreedyBarbara same day (6/4) at 11am. Contact information: [address] [address] [place] [id number] [phone]           Plan Of Care/Follow-up recommendations: Was [facility] RN [facility] patient demanding discharge and needed a family meeting right now. Her SO was waiting in the lobby since she called him in. Patient per chart was scheduled to be discharged on Monday. Pt however per RN demanding discharge now. [name] her SO of >20 yrs , was called in to the meeting , [facility] Berneice Heinrichina Tate , Scientist, physiologicalatty RN as well as CSW Engineer, technical salesMarieda and writer were present in the meeting. Patient started the meeting stating she was no happy with some staff here , and about possible HIPA violations. AC took notes of what her concerns were  and promised to address them. Pt was seen as arguing with her SO , blaming him of his actions , threatening to leave him , split up their property and so on. However , towards the end of the session, pt calmed down, reported she wanted to be discharged , with her SO .  Discussed family therapy on an outpatient basis for both of them as well  as discussed crisis plan. Pt to call 911 or walk in to nearest ED or [facility] if in a crisis.  Activity:  no restrictions Diet:  regular Tests:  as needed Other:  follow up with aftercare  Arlone Lenhardt, MD 05/23/2017, 10:03 AM

## 2017-05-23 NOTE — Progress Notes (Signed)
Adult Services Patient-Family Contact/Session  Attendees:  Katelyn Galloway, patient, CSW  Goal(s):  Discuss aftercare therapy options  Safety Concerns:  None expressed  Narrative:  Spent 1 hour with Katelyn Galloway and Katelyn Galloway in what was in large part a therapy session.  Provided several specific suggestions for communication improvements.  Encouraged Katelyn Galloway to focus on self instead of Galloway.  Provided information about what to expect from aftercare providers.   Barrier(s):  Katelyn Galloway is labile, impulsive, angry and accusational with Galloway.  Interventions:  Therapy, Resource provision, Deescalation, Encouragement  Recommendation(s):  Follow up with individual therapy for both, pull Galloway into last 15 minutes of Katelyn Galloway's individual sessions from time to time.  Work on communicating with "I" messages.  Continue breathing exercises.  Ask for DBT therapy when establishing new provider.  Follow-up Required:  No  Explanation:  N/A  [name] 05/23/2017, 10:51 AM

## 2017-05-23 NOTE — Progress Notes (Addendum)
Pt Engineer, water arrange meeting " this morning.Marland Kitchenas  soon as possible. I want to meet with the dr. I want to go home today . I am done with this place. The way I feel I've been treated here is unacceptable. I can go home and feel mistreated." RN notified MD, [facility], MSW that pt requested meeting and all parties met with pt (approx 30 min later) in her room on [address], with pt's SO present. . Pt stated in this meeting: " I'm tired of the inconsistencies. I'm ready to go home..I don't want to be here any longer" . Pt reported to [facility] that she was ' very upset about what happended yesterday.Marland KitchenMarland KitchenMarland KitchenI  saw that tech's board/ I read it and when he realized that I had read what he wrote, he got an attitude and he was rude.Hcut me off. " Pt and her SO then spent following 10-15 min arguing about their current relationship, who was at fault, who said what, who was going to do what. Pt witnessed by this writer telling Dr. Shea Evans she was " no longer suicidal" and that she was " ready to be discharged". Pt completed daily assessment and on this she wrote she deneid SI today and she rated her depression, hopelessness and anxiety ^/5/8", respectively. Writer completed dc instructions with pt, revieweing pt's AVS, SRA, transition and SSP. Pt stated verbal understanding and said " I can't wait to get home". RN gave pt prescritptions for her medicaitons, reviewed dosages and reason for taking and pt verbalized understanding.  All belongings are returned to pt and pt is escorted to bldg entrance where she was escorted to drivway , where SO picked her up.

## 2017-05-23 NOTE — Progress Notes (Signed)
Patient ID: Katelyn Galloway, female   DOB: 06/20/1961, 56 y.o.   MRN: [id number]  Pt got into a verbal altercation with another patient in the dayroom. Pt comes to nurses station yelling "I want to go home." When writer explained discharge process to patient, pt states "get me the doctor right now, I want to leave." Pt given a 1:1 on mental and emotional status. Revealed that patient is upset that her apology was not accepted by said patient after she called her a bit**. Pt states to writer "Just give me my medicines so I can get it over with." Pt seen going back into dayroom and sitting down to watch television.

## 2017-09-20 ENCOUNTER — Ambulatory Visit (INDEPENDENT_AMBULATORY_CARE_PROVIDER_SITE_OTHER): Payer: Medicare Other | Admitting: Family Medicine

## 2017-09-20 ENCOUNTER — Encounter: Payer: Self-pay | Admitting: Gastroenterology

## 2017-09-20 ENCOUNTER — Encounter: Payer: Self-pay | Admitting: Family Medicine

## 2017-09-20 VITALS — BP 112/80 | HR 104 | Temp 98.1°F | Ht 64.0 in | Wt 140.2 lb

## 2017-09-20 DIAGNOSIS — F515 Nightmare disorder: Secondary | ICD-10-CM | POA: Diagnosis not present

## 2017-09-20 DIAGNOSIS — F411 Generalized anxiety disorder: Secondary | ICD-10-CM | POA: Diagnosis not present

## 2017-09-20 DIAGNOSIS — G8929 Other chronic pain: Secondary | ICD-10-CM

## 2017-09-20 DIAGNOSIS — M542 Cervicalgia: Secondary | ICD-10-CM

## 2017-09-20 DIAGNOSIS — R1310 Dysphagia, unspecified: Secondary | ICD-10-CM

## 2017-09-20 MED ORDER — GABAPENTIN 600 MG PO TABS: 1800.0000 mg | ORAL_TABLET | Freq: Three times a day (TID) | ORAL | 2 refills | Status: DC

## 2017-09-20 MED ORDER — PRAZOSIN HCL 2 MG PO CAPS: 2.0000 mg | ORAL_CAPSULE | Freq: Every day | ORAL | 0 refills | Status: DC

## 2017-09-20 NOTE — Patient Instructions (Addendum)
Please see Shirlee Limerick for appointment with GI  Please schedule an appointment for a complete physical in 4-6 weeks  Please bring your xray films in for review and I will notify you once I have looked at them and we will formulate a plan
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
## 2017-10-08 ENCOUNTER — Other Ambulatory Visit: Payer: Self-pay | Admitting: Emergency Medicine

## 2017-10-08 MED ORDER — BACLOFEN 10 MG PO TABS: 10.0000 mg | ORAL_TABLET | Freq: Two times a day (BID) | ORAL | 0 refills | Status: DC | PRN

## 2017-10-08 MED ORDER — LEVOTHYROXINE SODIUM 25 MCG PO TABS: 25.0000 ug | ORAL_TABLET | Freq: Every day | ORAL | 0 refills | Status: DC

## 2017-10-14 ENCOUNTER — Other Ambulatory Visit: Payer: Self-pay | Admitting: Family Medicine

## 2017-10-14 ENCOUNTER — Other Ambulatory Visit (INDEPENDENT_AMBULATORY_CARE_PROVIDER_SITE_OTHER): Payer: Medicare Other

## 2017-10-14 DIAGNOSIS — E039 Hypothyroidism, unspecified: Secondary | ICD-10-CM | POA: Diagnosis not present

## 2017-10-14 DIAGNOSIS — Z8249 Family history of ischemic heart disease and other diseases of the circulatory system: Secondary | ICD-10-CM

## 2017-10-14 LAB — COMPREHENSIVE METABOLIC PANEL
ALBUMIN: 4.4 g/dL (ref 3.5–5.2)
ALK PHOS: 87 U/L (ref 39–117)
ALT: 14 U/L (ref 0–35)
AST: 18 U/L (ref 0–37)
BILIRUBIN TOTAL: 0.3 mg/dL (ref 0.2–1.2)
BUN: 10 mg/dL (ref 6–23)
CALCIUM: 9.5 mg/dL (ref 8.4–10.5)
CHLORIDE: 104 meq/L (ref 96–112)
CO2: 30 mEq/L (ref 19–32)
CREATININE: 0.89 mg/dL (ref 0.40–1.20)
GFR: 69.81 mL/min (ref >60.00)
Glucose, Bld: 113 mg/dL — ABNORMAL HIGH (ref 70–99)
Potassium: 4 mEq/L (ref 3.5–5.1)
SODIUM: 140 meq/L (ref 135–145)
TOTAL PROTEIN: 7.1 g/dL (ref 6.0–8.3)

## 2017-10-14 LAB — LIPID PANEL
CHOLESTEROL: 212 mg/dL — AB (ref 0–200)
HDL: 65.9 mg/dL (ref >39.00)
LDL CALC: 112 mg/dL — AB (ref 0–99)
NonHDL: 145.95
TRIGLYCERIDES: 171 mg/dL — AB (ref 0.0–149.0)
Total CHOL/HDL Ratio: 3
VLDL: 34.2 mg/dL (ref 0.0–40.0)

## 2017-10-14 LAB — TSH: TSH: 3.31 u[IU]/mL (ref 0.35–4.50)

## 2017-10-18 ENCOUNTER — Encounter: Payer: Self-pay | Admitting: Family Medicine

## 2017-10-18 ENCOUNTER — Ambulatory Visit (INDEPENDENT_AMBULATORY_CARE_PROVIDER_SITE_OTHER): Payer: Medicare Other | Admitting: Family Medicine

## 2017-10-18 VITALS — BP 126/80 | HR 109 | Temp 98.8°F | Ht 64.25 in | Wt 143.5 lb

## 2017-10-18 DIAGNOSIS — M797 Fibromyalgia: Secondary | ICD-10-CM | POA: Diagnosis not present

## 2017-10-18 DIAGNOSIS — Z23 Encounter for immunization: Secondary | ICD-10-CM | POA: Diagnosis not present

## 2017-10-18 DIAGNOSIS — M542 Cervicalgia: Secondary | ICD-10-CM | POA: Diagnosis not present

## 2017-10-18 DIAGNOSIS — G8929 Other chronic pain: Secondary | ICD-10-CM | POA: Diagnosis not present

## 2017-10-18 MED ORDER — MELOXICAM 7.5 MG PO TABS: 7.5000 mg | ORAL_TABLET | Freq: Every day | ORAL | 1 refills | Status: DC | PRN

## 2017-10-18 NOTE — Patient Instructions (Signed)
Good to see you today, please follow up in 3 months   Keeping You Healthy  Get These Tests  Blood Pressure- Have your blood pressure checked by your healthcare provider at least once a year.  Normal blood pressure is 120/80.  Weight- Have your body mass index (BMI) calculated to screen for obesity.  BMI is a measure of body fat based on height and weight.  You can calculate your own BMI at https://www.west-esparza.com/www.nhlbisupport.com/bmi/  Cholesterol- Have your cholesterol checked every year.  Diabetes- Have your blood sugar checked every year if you have high blood pressure, high cholesterol, a family history of diabetes or if you are overweight.  Pap Test - Have a pap test every 1 to 5 years if you have been sexually active.  If you are older than 65 and recent pap tests have been normal you may not need additional pap tests.  In addition, if you have had a hysterectomy  for benign disease additional pap tests are not necessary.  Mammogram-Yearly mammograms are essential for early detection of breast cancer  Screening for Colon Cancer- Colonoscopy starting at age 55. Screening may begin sooner depending on your family history and other health conditions.  Follow up colonoscopy as directed by your Gastroenterologist.  Screening for Osteoporosis- Screening begins at age 55 with bone density scanning, sooner if you are at higher risk for developing Osteoporosis.  Get these medicines  Calcium with Vitamin D- Your body requires 1200-1500 mg of Calcium a day and [phone] IU of Vitamin D a day.  You can only absorb 500 mg of Calcium at a time therefore Calcium must be taken in 2 or 3 separate doses throughout the day.  Hormones- Hormone therapy has been associated with increased risk for certain cancers and heart disease.  Talk to your healthcare provider about if you need relief from menopausal symptoms.  Aspirin- Ask your healthcare provider about taking Aspirin to prevent Heart Disease and Stroke.  Get these  Immuniztions  Flu shot- Every fall  Pneumonia shot- Once after the age of 55; if you are younger ask your healthcare provider if you need a pneumonia shot.  Tetanus- Every ten years.  Zostavax- Once after the age of 760 to prevent shingles.  Take these steps  Don't smoke- Your healthcare provider can help you quit. For tips on how to quit, ask your healthcare provider or go to www.smokefree.gov or call 1-800 QUIT-NOW.  Be physically active- Exercise 5 days a week for a minimum of 30 minutes.  If you are not already physically active, start slow and gradually work up to 30 minutes of moderate physical activity.  Try walking, dancing, bike riding, swimming, etc.  Eat a healthy diet- Eat a variety of healthy foods such as fruits, vegetables, whole grains, low fat milk, low fat cheeses, yogurt, lean meats, chicken, fish, eggs, dried beans, tofu, etc.  For more information go to www.thenutritionsource.org  Dental visit- Brush and floss teeth twice daily; visit your dentist twice a year.  Eye exam- Visit your Optometrist or Ophthalmologist yearly.  Drink alcohol in moderation- Limit alcohol intake to one drink or less a day.  Never drink and drive.  Depression- Your emotional health is as important as your physical health.  If you're feeling down or losing interest in things you normally enjoy, please talk to your healthcare provider.  Seat Belts- can save your life; always wear one  Smoke/Carbon Monoxide detectors- These detectors need to be installed on the appropriate level of  your home.  Replace batteries at least once a year.  Violence- If anyone is threatening or hurting you, please tell your healthcare provider.  Living Will/ Health care power of attorney- Discuss with your healthcare provider and family.

## 2017-10-18 NOTE — Progress Notes (Signed)
Subjective:    Patient ID: Katelyn Galloway, female    DOB: 11/19/1961, 56 y.o.   MRN: [id number]  HPI This is a 56 yo female who presents today for follow up of GAD, chronic low back pain. She was seen as new patient 09/20/17.  Has had some relief of her pain with chiropractic adjustment. Feels like she is in a better place. Psychiatrist added low dose Elavil and she is sleeping better. She is working on things with her long term boyfriend, trying to improve communication. No libido for several years.   Last CPE- several years Mammo- last 2017 Pap- hysterectomy Colonoscopy- at age 56, no polyps, 10 year recall Tdap- 2009 Flu- declines Eye-out of date Dental- over due Exercise- walks  Sleep- sleeps for a couple of hours and then awakens.    Past Medical History:  Diagnosis Date  . Anemia 1970  . Anxiety   . Arthritis   . Asthma    child  . Bronchitis    "years ago"  . Bronchitis    chronic hx  . Chronic back pain    hx of  . Depression   . Fibromyalgia   . GERD (gastroesophageal reflux disease)    occ  . Headache(784.0)    migraines  . Heart palpitations    occ if get anxious- no tests  . History of kidney stones   . Hypothyroidism   . Irritable bowel syndrome (IBS)   . Seizures (HCC)    x 2 during pregnancy per pt was not diagnosed with eclampsia   Past Surgical History:  Procedure Laterality Date  . ABDOMINAL HYSTERECTOMY  1992  . BACK SURGERY     x2  . CHOLECYSTECTOMY  2009 or 2010  . HARDWARE REMOVAL  12/29/2012   Procedure: HARDWARE REMOVAL;  Surgeon: Tia Alertavid S Jones, MD;  Location: [facility];  Service: Neurosurgery;  Laterality: N/A;  Lumbar hardware extraction  . kindey stone removal    . KNEE ARTHROSCOPY     right knee  . SPINAL CORD STIMULATOR INSERTION N/A 01/25/2015   Procedure: LUMBAR SPINAL CORD STIMULATOR INSERTION;  Surgeon: Gwynne EdingerPaul C Harkins, MD;  Location: [facility];  Service: Neurosurgery;  Laterality: N/A;  . TUBAL LIGATION     Family  History  Problem Relation Age of Onset  . Depression Mother   . Heart attack Mother   . Suicidality Father   . Heart disease Father   . Anesthesia problems Neg Hx    Social History  Substance Use Topics  . Smoking status: Current Some Day Smoker    Packs/day: 0.50    Years: 38.00    Types: Cigarettes  . Smokeless tobacco: Never Used     Comment: Pt reports using an electric cigarette ~ 2 times a day  . Alcohol use 0.6 oz/week    1 Glasses of wine per week      Review of Systems  Constitutional: Positive for fatigue. Negative for fever and unexpected weight change.  Respiratory: Negative for cough and shortness of breath.   Cardiovascular: Negative for chest pain, palpitations and leg swelling.  Gastrointestinal: Negative for abdominal pain, constipation and diarrhea.  Genitourinary: Negative for dysuria and frequency.  Musculoskeletal: Positive for back pain and neck pain.  Skin: Negative.   Neurological: Positive for headaches (occasional).  Psychiatric/Behavioral: Positive for dysphoric mood and sleep disturbance. The patient is nervous/anxious.        Objective:   Physical Exam Physical Exam  Constitutional: She  is oriented to person, place, and time. She appears well-developed and well-nourished. No distress.  HENT:  Head: Normocephalic and atraumatic.  Right Ear: External ear normal.  Left Ear: External ear normal.  Nose: Nose normal.  Mouth/Throat: Oropharynx is clear and moist. No oropharyngeal exudate.  Eyes: Conjunctivae are normal. Pupils are equal, round, and reactive to light.  Neck: Normal range of motion. Neck supple. No JVD present. No thyromegaly present.  Cardiovascular: Normal rate, regular rhythm, normal heart sounds and intact distal pulses.   Pulmonary/Chest: Effort normal and breath sounds normal. Right breast exhibits no inverted nipple, no mass, no nipple discharge, no skin change and no tenderness. Left breast exhibits no inverted nipple, no  mass, no nipple discharge, no skin change and no tenderness. Breasts are symmetrical.  Abdominal: Soft. Bowel sounds are normal. She exhibits no distension and no mass. There is no tenderness. There is no rebound and no guarding.  Musculoskeletal:Cervical and thoracic tenderness. Good ROM.  Lymphadenopathy:    She has no cervical adenopathy.  Neurological: She is alert and oriented to person, place, and time. She has normal reflexes.  Skin: Skin is warm and dry. She is not diaphoretic.  Psychiatric: She has a normal mood and affect. Her behavior is normal. Judgment and thought content normal.  Vitals reviewed.  BP 126/80   Pulse (!) 109   Temp 98.8 F (37.1 C) (Oral)   Ht 5' 4.25" (1.632 m)   Wt 143 lb 8 oz (65.1 kg)   BMI 24.44 kg/m  Wt Readings from Last 3 Encounters:  10/18/17 143 lb 8 oz (65.1 kg)  09/20/17 140 lb 4 oz (63.6 kg)  05/04/17 130 lb (59 kg)   Depression screen PHQ 2/9 10/18/2017  Decreased Interest 0  Down, Depressed, Hopeless 0  PHQ - 2 Score 0       Assessment & Plan:  1. Fibromyalgia - continue current meds, will add NSAID, encouraged regular exercise/stretching as tolerated - meloxicam (MOBIC) 7.5 MG tablet; Take 1-2 tablets (7.5-15 mg total) by mouth daily as needed for pain.  Dispense: 60 tablet; Refill: 1  2. Chronic neck pain - meloxicam (MOBIC) 7.5 MG tablet; Take 1-2 tablets (7.5-15 mg total) by mouth daily as needed for pain.  Dispense: 60 tablet; Refill: 1  3. Need for influenza vaccination - Flu Vaccine QUAD 6+ mos PF IM (Fluarix Quad PF)  - reviewed labs drawn 10/14/17 with patient  - follow up in 3 months  Katelyn Ree, FNP-BC  [place] [facility] at [facility], [facility]  10/19/2017 5:10 PM

## 2017-11-04 ENCOUNTER — Encounter: Payer: Self-pay | Admitting: *Deleted

## 2017-11-15 ENCOUNTER — Telehealth: Payer: Self-pay

## 2017-11-15 NOTE — Telephone Encounter (Signed)
Copied from CRM [id number]. Topic: Inquiry >> Nov 15, 2017  3:18 PM Yvonna Alanisobinson, Andra M wrote: Reason for CRM: Patient called requesting Olean Reeeborah Gessner to give her a call as soon as she can. Patient wants to discuss the following three medications that she is taking with Olean Reeeborah Gessner: Baclofen (LIORESAL) 10 MG tablet, gabapentin (NEURONTIN) 600 MG tablet, and meloxicam (MOBIC) 7.5 MG tablet. Patient wants Gavin PoundDeborah ONLY to call her.

## 2017-11-15 NOTE — Telephone Encounter (Signed)
Please advise 

## 2017-11-15 NOTE — Telephone Encounter (Signed)
Pop up came on screen Advisory: My chart acct restricted; [facility] [phone].  Pt last seen 10/18/17.

## 2017-11-15 NOTE — Telephone Encounter (Signed)
Called and spoke to patient who reports increased unsteadiness and several falls that she says started after her newest prescription of prazosin. The dose was not changed, it was still 2 mg, but she reports that the pill color changed. Verified with patient and it was still the 2 mg, ? Different manufacturer? She was instructed to stop and see if symptoms decrease, if not, needs follow up in office.  She was also asking about doses of baclofen and meloxicam, whether or not they could be increased. Discussed using meloxicam 7.5 mg as prescribed which is 1-2 po q 24 hours. Doesn't feel like baclofen is working, she is taking 10 mg every 12 hours, discussed increasing to 20 mg, but if it doesn't help at that strength, not likely to help at higher or more frequent doses.

## 2017-11-15 NOTE — Telephone Encounter (Signed)
Attempted to call patient.  No answer.  Will try again later. 

## 2017-11-16 ENCOUNTER — Ambulatory Visit (INDEPENDENT_AMBULATORY_CARE_PROVIDER_SITE_OTHER): Payer: Medicare Other | Admitting: Gastroenterology

## 2017-11-16 ENCOUNTER — Encounter (INDEPENDENT_AMBULATORY_CARE_PROVIDER_SITE_OTHER): Payer: Self-pay

## 2017-11-16 ENCOUNTER — Encounter: Payer: Self-pay | Admitting: Gastroenterology

## 2017-11-16 VITALS — BP 110/64 | HR 80 | Ht 64.25 in | Wt 145.2 lb

## 2017-11-16 DIAGNOSIS — K581 Irritable bowel syndrome with constipation: Secondary | ICD-10-CM

## 2017-11-16 DIAGNOSIS — R6881 Early satiety: Secondary | ICD-10-CM | POA: Diagnosis not present

## 2017-11-16 DIAGNOSIS — R1310 Dysphagia, unspecified: Secondary | ICD-10-CM | POA: Diagnosis not present

## 2017-11-16 NOTE — Progress Notes (Signed)
HPI :  56 y/o female with a history of chronic back pain, fibromyalgia, history of seizures associated with pregnancy, referred here by Olean Reeeborah Gessner FNP for new patient visit for symptoms of dysphagia.  Patient reports over the past 2-3 months she feels a "constriction" in her upper esophagus that impairs her ability to swallow. She endorses dysphagia to solids and pills, located around the sternal notch, which happens intermittently. She endorses a "pocket" in which she feels some of her ingested food content lodges in her chest. She has had some occasional regurgitation and occasional water brash, but denies any significant pyrosis. She endorses some sporadic nausea but no vomiting. She complains of early satiety which is been going on for a few months, has not had any associated weight loss, in fact has been gaining weight.. She previously had severe constipation while on chronic narcotics, she stopped morphine this past March and states her bowels have a much better. She has occasional lower abdominal cramping that relieved with a bowel movement, but for the most part has been fairly regular with using Colace daily. She denies any blood in her stools. She has a spinal cord stimulator place. She has not had a seizure for several years. She is using Mobic daily but denies any other NSAID use or aspirin use. No prior upper endoscopy. She denies any family history of esophageal or colon cancer. She has long-standing tobacco use.   Colonoscopy done 03/17/2013 - normal other than small hemorrhoids    Past Medical History:  Diagnosis Date  . Anemia 1970  . Anxiety   . Arthritis   . Asthma    child  . Bipolar disorder (HCC)   . Bronchitis    "years ago"  . Bronchitis    chronic hx  . Chronic back pain    hx of  . Depression   . Fibromyalgia   . GERD (gastroesophageal reflux disease)    occ  . Headache(784.0)    migraines  . Heart palpitations    occ if get anxious- no tests  .  History of kidney stones   . Hypothyroidism   . Irritable bowel syndrome (IBS)   . Seizures (HCC)    x 2 during pregnancy per pt was not diagnosed with eclampsia     Past Surgical History:  Procedure Laterality Date  . ABDOMINAL HYSTERECTOMY  1992  . BACK SURGERY     x2  . CHOLECYSTECTOMY  2009 or 2010  . HARDWARE REMOVAL  12/29/2012   Procedure: HARDWARE REMOVAL;  Surgeon: Tia Alertavid S Jones, MD;  Location: [facility];  Service: Neurosurgery;  Laterality: N/A;  Lumbar hardware extraction  . kindey stone removal    . KNEE ARTHROSCOPY     right knee  . SPINAL CORD STIMULATOR INSERTION N/A 01/25/2015   Procedure: LUMBAR SPINAL CORD STIMULATOR INSERTION;  Surgeon: Gwynne EdingerPaul C Harkins, MD;  Location: [facility];  Service: Neurosurgery;  Laterality: N/A;  . TUBAL LIGATION     Family History  Problem Relation Age of Onset  . Depression Mother   . Heart attack Mother   . Suicidality Father   . Heart disease Father   . Alcohol abuse Father   . Irritable bowel syndrome Father   . Anesthesia problems Neg Hx   . Colon cancer Neg Hx   . Esophageal cancer Neg Hx    Social History   Tobacco Use  . Smoking status: Current Some Day Smoker    Packs/day: 0.50  Years: 38.00    Pack years: 19.00    Types: Cigarettes  . Smokeless tobacco: Never Used  Substance Use Topics  . Alcohol use: No    Frequency: Never  . Drug use: Yes    Types: Marijuana   Current Outpatient Medications  Medication Sig Dispense Refill  . albuterol (PROVENTIL HFA;VENTOLIN HFA) 108 (90 Base) MCG/ACT inhaler INHALE TWO PUFFS BY MOUTH EVERY 6 HOURS AS NEEDED FOR WHEEZING OR FOR SHORTNESS OF BREATH    . amitriptyline (ELAVIL) 25 MG tablet Take 25 mg by mouth.    . baclofen (LIORESAL) 10 MG tablet Take 1 tablet (10 mg total) by mouth every 12 (twelve) hours as needed for muscle spasms (and back pain). 15 each 0  . carbamazepine (TEGRETOL) 200 MG tablet Take 1 tablet (200 mg total) by mouth at bedtime. For Pain/mood  stabilization 30 tablet 0  . diazepam (VALIUM) 5 MG tablet Take 1 tablet (5 mg total) by mouth 2 (two) times daily as needed (severe anxiety/pt is on valium per [place] controlled substance database). 10 tablet 0  . docusate sodium (COLACE) 100 MG capsule Take 1 capsule (100 mg total) by mouth daily. (May purchase from over the counter): For constipation 1 capsule 0  . gabapentin (NEURONTIN) 600 MG tablet Take 3 tablets (1,800 mg total) by mouth 3 (three) times daily. 90 tablet 2  . levothyroxine (SYNTHROID, LEVOTHROID) 25 MCG tablet Take 1 tablet (25 mcg total) by mouth daily before breakfast. For low thyroid hormone 30 tablet 0  . meloxicam (MOBIC) 7.5 MG tablet Take 1-2 tablets (7.5-15 mg total) by mouth daily as needed for pain. 60 tablet 1  . polyethylene glycol (MIRALAX / GLYCOLAX) packet Take 17 g by mouth daily as needed for moderate constipation or severe constipation. (May purchase from over the counter): For constipation 14 each 0  . venlafaxine XR (EFFEXOR-XR) 75 MG 24 hr capsule Take 3 capsules (225 mg total) by mouth daily with breakfast. 45 capsule 0  . cholecalciferol (VITAMIN D) 1000 units tablet Take 1 tablet (1,000 Units total) by mouth daily. For bone health (Patient not taking: Reported on 11/16/2017) 30 tablet 0  . prazosin (MINIPRESS) 2 MG capsule Take 1 capsule (2 mg total) by mouth at bedtime. For nightmares (Patient not taking: Reported on 11/16/2017) 30 capsule 0   No current facility-administered medications for this visit.    Allergies  Allergen Reactions  . Iodine Hives  . Latex Rash  . Tape Rash    Itching, paper tape     Review of Systems: All systems reviewed and negative except where noted in HPI.   Lab Results  Component Value Date   WBC 9.4 05/10/2017   HGB 13.8 05/10/2017   HCT 40.5 05/10/2017   MCV 91.6 05/10/2017   PLT 265 05/10/2017    Lab Results  Component Value Date   CREATININE 0.89 10/14/2017   BUN 10 10/14/2017   NA 140 10/14/2017   K  4.0 10/14/2017   CL 104 10/14/2017   CO2 30 10/14/2017    Lab Results  Component Value Date   ALT 14 10/14/2017   AST 18 10/14/2017   ALKPHOS 87 10/14/2017   BILITOT 0.3 10/14/2017      Physical Exam: BP 110/64   Pulse 80   Ht 5' 4.25" (1.632 m)   Wt 145 lb 3.2 oz (65.9 kg)   BMI 24.73 kg/m  Constitutional: Pleasant,well-developed, female in no acute distress. HEENT: Normocephalic and atraumatic. Conjunctivae are normal.  No scleral icterus. Neck supple.  Cardiovascular: Normal rate, regular rhythm.  Pulmonary/chest: Effort normal and breath sounds normal. No wheezing, rales or rhonchi. Abdominal: Soft, nondistended, nontender. There are no masses palpable. No hepatomegaly. Extremities: no edema Lymphadenopathy: No cervical adenopathy noted. Neurological: Alert and oriented to person place and time. Skin: Skin is warm and dry. No rashes noted. Psychiatric: Normal mood and affect. Behavior is normal.   ASSESSMENT AND PLAN: 56 year old female with medical history as outlined above, here for new patient visit to assess the following issues:  Dysphagia / early satiety - I discussed differential with patient for both of these symptoms. She doesn't have much reflux symptoms currently although I offered her a trial of PPI to see if this helps her symptoms, she declined at this time. Otherwise recommending an upper endoscopy to further evaluate and potentially treat with dilation. Following discussion of the risks and benefits of EGD with dilation she wanted to proceed. We'll plan on ruling out H. pylori with biopsies even if normal given her symptoms  IBS C - previously driven by narcotics, now doing much better being off narcotics. She will continue Colace as needed. Can follow-up as needed for this issue. Colonoscopy up-to-date.  Ileene PatrickSteven Treyden Hakim, MD [facility] Pager [phone]  CC: Emi BelfastGessner, Deborah B, FNP

## 2017-11-16 NOTE — Patient Instructions (Addendum)
If you are age 55 or older, your body mass index should be between 23-30. Your Body mass index is 24.73 kg/m. If this is out of the aforementioned range listed, please consider follow up with your Primary Care Provider.  If you are age 55 or younger, your body mass index should be between 19-25. Your Body mass index is 24.73 kg/m. If this is out of the aformentioned range listed, please consider follow up with your Primary Care Provider.   You have been scheduled for an endoscopy. Please follow written instructions given to you at your visit today. If you use inhalers (even only as needed), please bring them with you on the day of your procedure. Your physician has requested that you go to www.startemmi.com and enter the access code given to you at your visit today. This web site gives a general overview about your procedure. However, you should still follow specific instructions given to you by our office regarding your preparation for the procedure.   Thank you.

## 2017-11-17 ENCOUNTER — Other Ambulatory Visit: Payer: Self-pay

## 2017-11-17 ENCOUNTER — Encounter: Payer: Self-pay | Admitting: Gastroenterology

## 2017-11-17 ENCOUNTER — Ambulatory Visit (AMBULATORY_SURGERY_CENTER): Payer: Medicare Other | Admitting: Gastroenterology

## 2017-11-17 VITALS — BP 97/58 | HR 68 | Temp 97.8°F | Resp 15 | Ht 64.5 in | Wt 145.0 lb

## 2017-11-17 DIAGNOSIS — R569 Unspecified convulsions: Secondary | ICD-10-CM | POA: Diagnosis not present

## 2017-11-17 DIAGNOSIS — K2990 Gastroduodenitis, unspecified, without bleeding: Secondary | ICD-10-CM

## 2017-11-17 DIAGNOSIS — M797 Fibromyalgia: Secondary | ICD-10-CM | POA: Diagnosis not present

## 2017-11-17 DIAGNOSIS — K2950 Unspecified chronic gastritis without bleeding: Secondary | ICD-10-CM | POA: Diagnosis not present

## 2017-11-17 DIAGNOSIS — R1310 Dysphagia, unspecified: Secondary | ICD-10-CM | POA: Diagnosis not present

## 2017-11-17 DIAGNOSIS — F319 Bipolar disorder, unspecified: Secondary | ICD-10-CM | POA: Diagnosis not present

## 2017-11-17 DIAGNOSIS — K2970 Gastritis, unspecified, without bleeding: Secondary | ICD-10-CM | POA: Diagnosis not present

## 2017-11-17 MED ORDER — OMEPRAZOLE 40 MG PO CPDR: 40.0000 mg | DELAYED_RELEASE_CAPSULE | Freq: Two times a day (BID) | ORAL | 3 refills | Status: DC

## 2017-11-17 MED ORDER — SODIUM CHLORIDE 0.9 % IV SOLN: 500.0000 mL | INTRAVENOUS | Status: DC

## 2017-11-17 NOTE — Progress Notes (Signed)
Called to room to assist during endoscopic procedure.  Patient ID and intended procedure confirmed with present staff. Received instructions for my participation in the procedure from the performing physician.  

## 2017-11-17 NOTE — Op Note (Signed)
[facility] Patient Name: Katelyn LeschesKimberly Catano Procedure Date: 11/17/2017 9:49 AM MRN: [id number] Endoscopist: Viviann SpareSteven P. Armbruster MD, MD Age: 56 Referring MD:  Date of Birth: 02/04/1961 Gender: Female Account #: [id number] Procedure:                Upper GI endoscopy Indications:              Dysphagia, Early satiety Medicines:                Monitored Anesthesia Care Procedure:                Pre-Anesthesia Assessment:                           - Prior to the procedure, a History and Physical                            was performed, and patient medications and                            allergies were reviewed. The patient's tolerance of                            previous anesthesia was also reviewed. The risks                            and benefits of the procedure and the sedation                            options and risks were discussed with the patient.                            All questions were answered, and informed consent                            was obtained. Prior Anticoagulants: The patient has                            taken no previous anticoagulant or antiplatelet                            agents. ASA Grade Assessment: II - A patient with                            mild systemic disease. After reviewing the risks                            and benefits, the patient was deemed in                            satisfactory condition to undergo the procedure.                           After obtaining informed consent, the endoscope was  passed under direct vision. Throughout the                            procedure, the patient's blood pressure, pulse, and                            oxygen saturations were monitored continuously. The                            Model GIF-[id number]) scope was introduced                            through the mouth, and advanced to the second part                            of duodenum. The  upper GI endoscopy was                            accomplished without difficulty. The patient                            tolerated the procedure well. Scope In: Scope Out: Findings:                 Esophagogastric landmarks were identified: the                            Z-line was found at 35 cm, the gastroesophageal                            junction was found at 35 cm and the upper extent of                            the gastric folds was found at 39 cm from the                            incisors.                           A 4 cm hiatal hernia was present.                           Diffuse mucosal changes characterized by sloughing                            of the mucosa consistent with esophagitis dissecans                            superficialis were found in the middle third of the                            esophagus. Biopsies were taken with a cold forceps  for histology.                           The exam of the esophagus was otherwise normal. No                            obvious stenosis appreciated.                           Patchy mild inflammation characterized by adherent                            blood and erythema was found in the gastric antrum.                            Biopsies were taken with a cold forceps for                            Helicobacter pylori testing.                           The exam of the stomach was otherwise normal.                           The duodenal bulb and second portion of the                            duodenum were normal. Complications:            No immediate complications. Estimated blood loss:                            Minimal. Estimated Blood Loss:     Estimated blood loss was minimal. Impression:               - Esophagogastric landmarks identified.                           - 4 cm hiatal hernia.                           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 Biopsied. I                            suspect this may be the cause of the patient's                            dysphagia.                           - Gastritis. Biopsied.                           - Normal duodenal bulb and second portion of  the                            duodenum. Recommendation:           - Patient has a contact number available for                            emergencies. The signs and symptoms of potential                            delayed complications were discussed with the                            patient. Return to normal activities tomorrow.                            Written discharge instructions were provided to the                            patient.                           - Resume previous diet.                           - Continue present medications.                           - Start omeprazole 40mg  twice daily for one month,                            then decrease to once daily. Await course on                            therapy, I am hopeful this will resolve symptoms                           - Await pathology results. Viviann Spare P. Armbruster MD, MD 11/17/2017 10:22:05 AM This report has been signed electronically.

## 2017-11-17 NOTE — Progress Notes (Signed)
Report to PACU, RN, vss, BBS= Clear.  

## 2017-11-17 NOTE — Patient Instructions (Signed)
**   Handouts given on gastritis, hiatal hernia and GERD. ** Start taking Omeprazole 40mg  twice daily for one month, then decrease to once daily.   YOU HAD AN ENDOSCOPIC PROCEDURE TODAY AT THE [facility]:   Refer to the procedure report that was given to you for any specific questions about what was found during the examination.  If the procedure report does not answer your questions, please call your gastroenterologist to clarify.  If you requested that your care partner not be given the details of your procedure findings, then the procedure report has been included in a sealed envelope for you to review at your convenience later.  YOU SHOULD EXPECT: Some feelings of bloating in the abdomen. Passage of more gas than usual.  Walking can help get rid of the air that was put into your GI tract during the procedure and reduce the bloating. If you had a lower endoscopy (such as a colonoscopy or flexible sigmoidoscopy) you may notice spotting of blood in your stool or on the toilet paper. If you underwent a bowel prep for your procedure, you may not have a normal bowel movement for a few days.  Please Note:  You might notice some irritation and congestion in your nose or some drainage.  This is from the oxygen used during your procedure.  There is no need for concern and it should clear up in a day or so.  SYMPTOMS TO REPORT IMMEDIATELY:   Following upper endoscopy (EGD)  Vomiting of blood or coffee ground material  New chest pain or pain under the shoulder blades  Painful or persistently difficult swallowing  New shortness of breath  Fever of 100F or higher  Black, tarry-looking stools  For urgent or emergent issues, a gastroenterologist can be reached at any hour by calling [phone].   DIET:  We do recommend a small meal at first, but then you may proceed to your regular diet.  Drink plenty of fluids but you should avoid alcoholic beverages for 24 hours.  ACTIVITY:  You should  plan to take it easy for the rest of today and you should NOT DRIVE or use heavy machinery until tomorrow (because of the sedation medicines used during the test).    FOLLOW UP: Our staff will call the number listed on your records the next business day following your procedure to check on you and address any questions or concerns that you may have regarding the information given to you following your procedure. If we do not reach you, we will leave a message.  However, if you are feeling well and you are not experiencing any problems, there is no need to return our call.  We will assume that you have returned to your regular daily activities without incident.  If any biopsies were taken you will be contacted by phone or by letter within the next 1-3 weeks.  Please call us at [phone] if you have not heard about the biopsies in 3 weeks.    SIGNATURES/CONFIDENTIALITY: You and/or your care partner have signed paperwork which will be entered into your electronic medical record.  These signatures attest to the fact that that the information above on your After Visit Summary has been reviewed and is understood.  Full responsibility of the confidentiality of this discharge information lies with you and/or your care-partner.

## 2017-11-18 ENCOUNTER — Telehealth: Payer: Self-pay

## 2017-11-18 NOTE — Telephone Encounter (Signed)
  Follow up Call-  Call Soniyah Mcglory number 11/17/2017  Post procedure Call Briceida Rasberry phone  # [phone]  Permission to leave phone message Yes  Some recent data might be hidden     Patient questions:  Do you have a fever, pain , or abdominal swelling? No. Pain Score  0 *  Have you tolerated food without any problems? Yes.    Have you been able to return to your normal activities? Yes.    Do you have any questions about your discharge instructions: Diet   No. Medications  No. Follow up visit  No.  Do you have questions or concerns about your Care? No.  Actions: * If pain score is 4 or above: No action needed, pain <4.

## 2017-11-19 ENCOUNTER — Telehealth: Payer: Self-pay | Admitting: Family Medicine

## 2017-11-19 NOTE — Telephone Encounter (Signed)
Telephone note only.

## 2017-11-19 NOTE — Telephone Encounter (Signed)
Called and spoke to patient regarding xray disc. I reviewed images with Dr. Patsy Lageropland who said there were arthritic changes. Relayed this to patient.

## 2017-11-23 ENCOUNTER — Encounter: Payer: Self-pay | Admitting: Gastroenterology

## 2017-11-26 ENCOUNTER — Telehealth: Payer: Self-pay | Admitting: Family Medicine

## 2017-11-26 NOTE — Telephone Encounter (Signed)
Copied from CRM [id number]. Topic: Quick Communication - See Telephone Encounter >> Nov 26, 2017 11:55 AM Cipriano BunkerLambe, Annette S wrote: CRM for notification. See Telephone encounter for:  Patient called pharmacy is needing refill prescription Prozasin. Fax may be coming 11/26/17.

## 2017-12-01 ENCOUNTER — Telehealth: Payer: Self-pay

## 2017-12-01 ENCOUNTER — Telehealth: Payer: Self-pay | Admitting: Family Medicine

## 2017-12-01 MED ORDER — LEVOTHYROXINE SODIUM 25 MCG PO TABS: 25.0000 ug | ORAL_TABLET | Freq: Every day | ORAL | 0 refills | Status: DC

## 2017-12-01 NOTE — Telephone Encounter (Signed)
Copied from CRM #[id number]. Topic: Quick Communication - Rx Refill/Question >> Dec 01, 2017 11:02 AM Katelyn Galloway, Katelyn Galloway wrote: Has the patient contacted their pharmacy? No.   (Agent: If no, request that the patient contact the pharmacy for the refill.)   levothyroxine (SYNTHROID, LEVOTHROID) 25 MCG tablet   Preferred Pharmacy (with phone number or street name[facility] [place] [place], [place] - [address] [phone] (Phone)    Agent: Please be advised that RX refills may take up to 3 business days. We ask that you follow-up with your pharmacy.

## 2017-12-01 NOTE — Telephone Encounter (Signed)
Copied from CRM #[id number]. Topic: Referral - Request >> Dec 01, 2017 11:05 AM Gerrianne ScalePayne, Angela L wrote: Reason for CRM: patient would like for Leone PayorGessner to make her a referral to a Rheumatologist for her arthritis

## 2017-12-01 NOTE — Telephone Encounter (Signed)
Please seen 11/19/17 phone note and pt last seen 10/18/17.

## 2017-12-10 ENCOUNTER — Other Ambulatory Visit: Payer: Self-pay | Admitting: Family Medicine

## 2017-12-10 DIAGNOSIS — M2550 Pain in unspecified joint: Secondary | ICD-10-CM

## 2017-12-10 NOTE — Telephone Encounter (Signed)
Please call patient and tell her I have ordered some preliminary lab work prior to sending rheumatology referral. Needs a lab only visit.

## 2017-12-10 NOTE — Telephone Encounter (Signed)
Called and left voicemail for pt to return call to office. Please advise patient upon her return call.

## 2017-12-16 NOTE — Telephone Encounter (Signed)
Called and spoke with patient. She states that she doesn't currently have insurance until after January 1 and will contact the office after that date and schedule a lab only appointment. Understanding verbalized nothing further needed at this time.

## 2017-12-24 ENCOUNTER — Other Ambulatory Visit (INDEPENDENT_AMBULATORY_CARE_PROVIDER_SITE_OTHER): Payer: Medicare Other

## 2017-12-24 DIAGNOSIS — M2550 Pain in unspecified joint: Secondary | ICD-10-CM | POA: Diagnosis not present

## 2017-12-24 LAB — SEDIMENTATION RATE: SED RATE: 16 mm/h (ref 0–30)

## 2017-12-24 LAB — C-REACTIVE PROTEIN: CRP: 1 mg/dL (ref 0.5–20.0)

## 2017-12-27 LAB — ANA: Anti Nuclear Antibody(ANA): NEGATIVE

## 2017-12-27 LAB — RHEUMATOID FACTOR

## 2017-12-28 ENCOUNTER — Other Ambulatory Visit: Payer: Self-pay | Admitting: Family Medicine

## 2017-12-28 MED ORDER — BACLOFEN 10 MG PO TABS: 10.0000 mg | ORAL_TABLET | Freq: Two times a day (BID) | ORAL | 0 refills | Status: DC | PRN

## 2017-12-28 NOTE — Telephone Encounter (Signed)
Pt is requesting a refill on Liorseal.

## 2017-12-28 NOTE — Telephone Encounter (Signed)
Lioresal last filled # 15 on 10/08/17. Pt last seen 10/18/17. Pt has appt for 3 mth f/u on 01/17/18.Please advise.

## 2017-12-28 NOTE — Telephone Encounter (Signed)
Copied from CRM [id number]. Topic: Quick Communication - Rx Refill/Question >> Dec 28, 2017  3:20 PM Oneal GroutSebastian, Jennifer S wrote: Has the patient contacted their pharmacy? Yes.     (Agent: If no, request that the patient contact the pharmacy for the refill.)   Preferred Pharmacy (with phone number or street name): [facility]   Agent: Please be advised that RX refills may take up to 3 business days. We ask that you follow-up with your pharmacy. Requesting refill on baclofen (LIORESAL) 10 MG tablet

## 2018-01-10 MED ORDER — BACLOFEN 10 MG PO TABS: 10.0000 mg | ORAL_TABLET | Freq: Two times a day (BID) | ORAL | 0 refills | Status: DC | PRN

## 2018-01-10 NOTE — Addendum Note (Signed)
Addended by: Patience MuscaISLEY, Burwell Bethel M on: 01/10/2018 01:40 PM   Modules accepted: Orders

## 2018-01-10 NOTE — Telephone Encounter (Signed)
Pt requesting of Lioresal. Pt states she takes twice a day and prefers a 3 month script. F/u appt scheduled for 01/17/18

## 2018-01-10 NOTE — Telephone Encounter (Signed)
Patient said she is out again. She needs atleast 50 for a month. Would prefer a 3 month script, as she takes it twice a day. Please advise.     [facility]

## 2018-01-17 ENCOUNTER — Other Ambulatory Visit: Payer: Self-pay | Admitting: Family Medicine

## 2018-01-17 ENCOUNTER — Encounter: Payer: Self-pay | Admitting: Family Medicine

## 2018-01-17 ENCOUNTER — Ambulatory Visit (INDEPENDENT_AMBULATORY_CARE_PROVIDER_SITE_OTHER): Payer: Medicare Other | Admitting: Family Medicine

## 2018-01-17 ENCOUNTER — Telehealth: Payer: Self-pay | Admitting: *Deleted

## 2018-01-17 VITALS — BP 110/70 | HR 76 | Temp 98.0°F | Wt 144.5 lb

## 2018-01-17 DIAGNOSIS — G8929 Other chronic pain: Secondary | ICD-10-CM | POA: Diagnosis not present

## 2018-01-17 DIAGNOSIS — F411 Generalized anxiety disorder: Secondary | ICD-10-CM | POA: Diagnosis not present

## 2018-01-17 DIAGNOSIS — M797 Fibromyalgia: Secondary | ICD-10-CM | POA: Diagnosis not present

## 2018-01-17 DIAGNOSIS — K219 Gastro-esophageal reflux disease without esophagitis: Secondary | ICD-10-CM

## 2018-01-17 DIAGNOSIS — E039 Hypothyroidism, unspecified: Secondary | ICD-10-CM | POA: Diagnosis not present

## 2018-01-17 DIAGNOSIS — J4520 Mild intermittent asthma, uncomplicated: Secondary | ICD-10-CM | POA: Diagnosis not present

## 2018-01-17 DIAGNOSIS — M542 Cervicalgia: Secondary | ICD-10-CM | POA: Diagnosis not present

## 2018-01-17 NOTE — Patient Instructions (Addendum)
Try 1 teaspoon of local honey daily  Great to see you, follow up in 6 months

## 2018-01-17 NOTE — Progress Notes (Signed)
Subjective:    Patient ID: Katelyn Galloway, female    DOB: 07/15/1961, 57 y.o.   MRN: [id number]  HPI This is a 57 yo female who presents today for follow up of chronic pain. Has been taking Tru life uric acid supplement and has had improvement, seeing chiropractor who has been helping with her neck pain. 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    Occasionally has difficulty waking up, about 2x/ month. Usually when she is more tired. If she gets into sitting position she can get moving.   Has had some chronic nasal congestion, using saline with some improvement.  Things are going better with her live in boyfriend. She is sleeping a little better.   On online fibromyalgia online support group which she finds helpful.   Past Medical History:  Diagnosis Date  . Anemia 1970  . Anxiety   . Arthritis   . Asthma    child  . Bipolar disorder (HCC)   . Bronchitis    "years ago"  . Bronchitis    chronic hx  . Chronic back pain    hx of  . Depression   . Fibromyalgia   . GERD (gastroesophageal reflux disease)    occ  . Headache(784.0)    migraines  . Heart palpitations    occ if get anxious- no tests  . History of kidney stones   . Hypothyroidism   . Irritable bowel syndrome (IBS)   . Seizures (HCC)    x 2 during pregnancy per pt was not diagnosed with eclampsia   Past Surgical History:  Procedure Laterality Date  . ABDOMINAL HYSTERECTOMY  1992  . BACK SURGERY     x2  . CESAREAN SECTION  1990  . CHOLECYSTECTOMY  2009 or 2010  . HARDWARE REMOVAL  12/29/2012   Procedure: HARDWARE REMOVAL;  Surgeon: Katelyn Alertavid S Jones, MD;  Location: [facility];  Service: Neurosurgery;  Laterality: N/A;  Lumbar hardware extraction  . kindey stone removal    . KNEE ARTHROSCOPY     right knee  . SPINAL CORD STIMULATOR INSERTION N/A 01/25/2015   Procedure: LUMBAR SPINAL CORD STIMULATOR INSERTION;  Surgeon: Katelyn EdingerPaul C Harkins, MD;  Location: [facility];  Service:  Neurosurgery;  Laterality: N/A;  . TUBAL LIGATION     Family History  Problem Relation Age of Onset  . Depression Mother   . Heart attack Mother   . Suicidality Father   . Heart disease Father   . Alcohol abuse Father   . Irritable bowel syndrome Father   . Anesthesia problems Neg Hx   . Colon cancer Neg Hx   . Esophageal cancer Neg Hx    Social History   Tobacco Use  . Smoking status: Current Some Day Smoker    Packs/day: 0.50    Years: 38.00    Pack years: 19.00    Types: Cigarettes  . Smokeless tobacco: Never Used  Substance Use Topics  . Alcohol use: No    Frequency: Never  . Drug use: Yes    Types: Marijuana      Review of Systems Per HPI    Objective:   Physical Exam  Constitutional: She is oriented to person, place, and time. She appears well-developed and well-nourished. No distress.  HENT:  Head: Normocephalic and atraumatic.  Eyes: Conjunctivae are normal.  Cardiovascular: Normal rate.  Pulmonary/Chest: Effort normal.  Neurological: She is alert and oriented to person, place, and time.  Skin: Skin is warm and dry. She is not diaphoretic.  Psychiatric: She has a normal mood and affect. Her behavior is normal. Judgment and thought content normal.  Much brighter affect today.   Vitals reviewed.    BP 110/70   Pulse 76   Temp 98 F (36.7 C) (Oral)   Wt 144 lb 8 oz (65.5 kg)   BMI 24.42 kg/m  Wt Readings from Last 3 Encounters:  01/17/18 144 lb 8 oz (65.5 kg)  11/17/17 145 lb (65.8 kg)  11/16/17 145 lb 3.2 oz (65.9 kg)        Assessment & Plan:  1. Fibromyalgia - Doing better on current regimen of supplements and NSAIDs/muscle relaxer/gabapentin, suspect increased activity also helping - lidocaine (LIDODERM) 5 %; Place 1 patch onto the skin daily. Remove & Discard patch within 12 hours or as directed by MD  Dispense: 30 patch; Refill: 5 - meloxicam (MOBIC) 7.5 MG tablet; Take 1-2 tablets (7.5-15 mg total) by mouth daily as needed for pain.   Dispense: 120 tablet; Refill: 1  2. Chronic neck pain - she is getting improvement with chiropractic care  - lidocaine (LIDODERM) 5 %; Place 1 patch onto the skin daily. Remove & Discard patch within 12 hours or as directed by MD  Dispense: 30 patch; Refill: 5 - meloxicam (MOBIC) 7.5 MG tablet; Take 1-2 tablets (7.5-15 mg total) by mouth daily as needed for pain.  Dispense: 120 tablet; Refill: 1  3. GAD (generalized anxiety disorder) - Doing well on current meds, Dr. Evelene Galloway filling these  4. Mild intermittent asthma without complication - refill albuterol inhaler  5. Hypothyroidism, unspecified type - recheck TSH at next visit - levothyroxine (SYNTHROID, LEVOTHROID) 25 MCG tablet; Take 1 tablet (25 mcg total) by mouth daily before breakfast. For low thyroid hormone  Dispense: 90 tablet; Refill: 1  6. Gastroesophageal reflux disease, esophagitis presence not specified - omeprazole (PRILOSEC) 40 MG capsule; Take 1 capsule (40 mg total) by mouth daily.  Dispense: 90 capsule; Refill: 3   Katelyn Ree, FNP-BC  [facility], [facility]  01/18/2018 7:02 AM

## 2018-01-17 NOTE — Telephone Encounter (Signed)
Copied from CRM [id number]. Topic: General - Other >> Jan 17, 2018  4:30 PM Clack, Princella PellegriniJessica D wrote: Reason for CRM: Pt wanted to call and let Leone PayorGessner know the name of the website they talked about at today's Ov: [email or web address]

## 2018-01-18 ENCOUNTER — Telehealth: Payer: Self-pay | Admitting: *Deleted

## 2018-01-18 ENCOUNTER — Encounter: Payer: Self-pay | Admitting: Family Medicine

## 2018-01-18 MED ORDER — LIDOCAINE 5 % EX PTCH: 1.0000 | MEDICATED_PATCH | CUTANEOUS | 5 refills | Status: DC

## 2018-01-18 MED ORDER — ALBUTEROL SULFATE HFA 108 (90 BASE) MCG/ACT IN AERS: INHALATION_SPRAY | RESPIRATORY_TRACT | 5 refills | Status: DC

## 2018-01-18 MED ORDER — OMEPRAZOLE 40 MG PO CPDR: 40.0000 mg | DELAYED_RELEASE_CAPSULE | Freq: Every day | ORAL | 3 refills | Status: DC

## 2018-01-18 MED ORDER — MELOXICAM 7.5 MG PO TABS: 7.5000 mg | ORAL_TABLET | Freq: Every day | ORAL | 1 refills | Status: DC | PRN

## 2018-01-18 MED ORDER — LEVOTHYROXINE SODIUM 25 MCG PO TABS: 25.0000 ug | ORAL_TABLET | Freq: Every day | ORAL | 1 refills | Status: DC

## 2018-01-18 NOTE — Telephone Encounter (Signed)
-----   Message from Emi Belfasteborah B Gessner, FNP sent at 01/18/2018  7:00 AM EST ----- Please call patient's pharmacy and verify gabapentin dose and frequency and how often she is having it filled.  Thanks,  Ameren CorporationDebbie

## 2018-01-18 NOTE — Telephone Encounter (Signed)
Spoke to pharmacist, Eunice Blaseebbie who states the pt has 2 separate Rx for Gabapentin. The first Rx was written 06/01/17 #180 12R by Dr Milagros Evenerupinder Kaur for 600mg , 2 tabs po TID. She also received a Rx from D. Leone PayorGessner, NP on 09/20/2017 which she filled 10/4 #90 2R, with instruction of 3tabs, TID, which only gave pt 10d supply. Pt has since been filling Dr Carie CaddyKaur's Rx which she picked up on 10/07/17, 11/22/17, 12/23/16  #180. Pharmacist states pts still has 2 [facility] remaining [facility], and 7 refills remaining from Dr Evelene CroonKaur. They are requesting clarification ASAP as pt is due for refill soon.

## 2018-01-19 ENCOUNTER — Other Ambulatory Visit: Payer: Self-pay | Admitting: Family Medicine

## 2018-01-19 DIAGNOSIS — G8929 Other chronic pain: Secondary | ICD-10-CM

## 2018-01-19 DIAGNOSIS — M542 Cervicalgia: Principal | ICD-10-CM

## 2018-01-19 MED ORDER — GABAPENTIN 600 MG PO TABS: 1200.0000 mg | ORAL_TABLET | Freq: Three times a day (TID) | ORAL | 1 refills | Status: DC

## 2018-01-19 NOTE — Telephone Encounter (Signed)
Prescription sent to patient's pharmacy for gabapentin 600 mg, two tabs TID, #180, 1 refill.

## 2018-01-31 NOTE — Telephone Encounter (Signed)
Please sign encounter to close when completed. 

## 2018-02-15 ENCOUNTER — Telehealth: Payer: Self-pay | Admitting: *Deleted

## 2018-02-15 NOTE — Telephone Encounter (Signed)
Form received indicating PA required for lidocaine patch. Completed over the phone with an agent. Response to be received within 72hrs via fax

## 2018-02-18 ENCOUNTER — Other Ambulatory Visit: Payer: Self-pay | Admitting: Family Medicine

## 2018-02-18 ENCOUNTER — Telehealth: Payer: Self-pay | Admitting: Family Medicine

## 2018-02-18 DIAGNOSIS — E039 Hypothyroidism, unspecified: Secondary | ICD-10-CM

## 2018-02-18 MED ORDER — LEVOTHYROXINE SODIUM 25 MCG PO TABS: 25.0000 ug | ORAL_TABLET | Freq: Every day | ORAL | 1 refills | Status: DC

## 2018-02-18 NOTE — Telephone Encounter (Signed)
Copied from CRM [phone]. Topic: Quick Communication - See Telephone Encounter >> Feb 18, 2018  4:02 PM Elliot GaultBell, Tiffany M wrote:  [facility] [place]  Call back number: [phone]  Reason for call:  Pharmacy states manufacture d/c levothyroxine (SYNTHROID, LEVOTHROID) 25 MCG tablet and would like to switch to another manufacture but in need of PCP approval, please advise

## 2018-02-18 NOTE — Telephone Encounter (Signed)
New order sent to patient's pharmacy.  

## 2018-02-21 ENCOUNTER — Telehealth: Payer: Self-pay | Admitting: Family Medicine

## 2018-02-21 NOTE — Telephone Encounter (Signed)
Spoke with Nadine CountsBob, pharmacist at [facility], he will check to see if they have received the new prescription per Larose Kellseborah Gesner.

## 2018-02-21 NOTE — Telephone Encounter (Signed)
Per pharmacist- the medication permissions have been taken care of.

## 2018-02-21 NOTE — Telephone Encounter (Signed)
Copied from CRM [phone]. Topic: Quick Communication - Rx Refill/Question >> Feb 21, 2018  8:54 AM Landry MellowFoltz, Melissa J wrote: Medication: levothyroxine (SYNTHROID, LEVOTHROID) 25 MCG tablet   Has the patient contacted their pharmacy? Yes.  Pharm called    (Agent: If no, request that the patient contact the pharmacy for the refill.)   Preferred Pharmacy (with phone number or street name): [facility] - they are out of current manufacturer - pharm is asking to [facility] manufacturer [facility]  Cb for pharm is - [phone] and callback voicemail to call in new rx is [phone]    Agent: Please be advised that RX refills may take up to 3 business days. We ask that you follow-up with your pharmacy.

## 2018-02-22 ENCOUNTER — Encounter: Payer: Self-pay | Admitting: Family Medicine

## 2018-02-22 NOTE — Telephone Encounter (Signed)
Fax received indicating PA denied. Pt advised she may obtain 4% lidocaine OTC

## 2018-02-24 NOTE — Telephone Encounter (Signed)
Yes, I did the prior auth and it has been denied. The denial fax has been sent for scanning into the pts chart.

## 2018-02-28 ENCOUNTER — Other Ambulatory Visit: Payer: Self-pay | Admitting: Family Medicine

## 2018-02-28 DIAGNOSIS — M542 Cervicalgia: Secondary | ICD-10-CM

## 2018-02-28 DIAGNOSIS — M797 Fibromyalgia: Secondary | ICD-10-CM

## 2018-02-28 DIAGNOSIS — M549 Dorsalgia, unspecified: Secondary | ICD-10-CM

## 2018-02-28 DIAGNOSIS — G8929 Other chronic pain: Secondary | ICD-10-CM

## 2018-03-03 ENCOUNTER — Emergency Department
Admission: EM | Admit: 2018-03-03 | Discharge: 2018-03-03 | Disposition: A | Discharge status: Home / Self Care | Payer: Medicare Other | Attending: Emergency Medicine | Admitting: Emergency Medicine

## 2018-03-03 ENCOUNTER — Other Ambulatory Visit: Payer: Self-pay

## 2018-03-03 ENCOUNTER — Telehealth: Payer: Self-pay | Admitting: *Deleted

## 2018-03-03 DIAGNOSIS — M797 Fibromyalgia: Secondary | ICD-10-CM | POA: Insufficient documentation

## 2018-03-03 DIAGNOSIS — R51 Headache: Secondary | ICD-10-CM | POA: Diagnosis not present

## 2018-03-03 DIAGNOSIS — K146 Glossodynia: Secondary | ICD-10-CM | POA: Diagnosis not present

## 2018-03-03 DIAGNOSIS — Z79899 Other long term (current) drug therapy: Secondary | ICD-10-CM | POA: Insufficient documentation

## 2018-03-03 DIAGNOSIS — F17210 Nicotine dependence, cigarettes, uncomplicated: Secondary | ICD-10-CM | POA: Insufficient documentation

## 2018-03-03 DIAGNOSIS — J45909 Unspecified asthma, uncomplicated: Secondary | ICD-10-CM | POA: Diagnosis not present

## 2018-03-03 DIAGNOSIS — E039 Hypothyroidism, unspecified: Secondary | ICD-10-CM | POA: Insufficient documentation

## 2018-03-03 MED ORDER — CYCLOBENZAPRINE HCL 10 MG PO TABS
10.0000 mg | ORAL_TABLET | Freq: Once | ORAL | Status: AC
  Administered 2018-03-03: 10 mg via ORAL
  Filled 2018-03-03: qty 1

## 2018-03-03 MED ORDER — OXYCODONE-ACETAMINOPHEN 5-325 MG PO TABS
1.0000 | ORAL_TABLET | Freq: Once | ORAL | Status: AC
  Administered 2018-03-03: 1 via ORAL
  Filled 2018-03-03: qty 1

## 2018-03-03 MED ORDER — DIPHENHYDRAMINE HCL 12.5 MG/5ML PO ELIX
25.0000 mg | ORAL_SOLUTION | Freq: Once | ORAL | Status: AC
  Administered 2018-03-03: 25 mg via ORAL
  Filled 2018-03-03: qty 10

## 2018-03-03 MED ORDER — LIDOCAINE VISCOUS 2 % MT SOLN
15.0000 mL | Freq: Once | OROMUCOSAL | Status: AC
  Administered 2018-03-03: 15 mL via OROMUCOSAL
  Filled 2018-03-03: qty 15

## 2018-03-03 MED ORDER — OXYCODONE-ACETAMINOPHEN 5-325 MG PO TABS: 1.0000 | ORAL_TABLET | Freq: Four times a day (QID) | ORAL | 0 refills | Status: DC | PRN

## 2018-03-03 NOTE — ED Notes (Signed)
See triage note   States she is experiencing extreme pain all over body  And also having some tingling to hands   States she does having intermittent tingling to hands  Also has been under a lot of stress lately  Has internal TENS unit

## 2018-03-03 NOTE — Telephone Encounter (Signed)
Spoke with patient. She states she has internal stem-tens unit that was placed internally- and Dr Yves Dillhasnis office said they can not accept patients that have this. Patient states she saw Dr Ollen BowlHarkins before who placed tens unit in for her per patient. I do not see records about this from that office. Called Dr Ollen BowlHarkins office to get patient scheduled with provider again, Irving Burtonmily states that they do not have a record that Dr Ollen BowlHarkins placed this for her but she would look into this and she will call patient to get her scheduled. Per their record LOV for the patient was in August 2018, Irving Burtonmily will call me back to let me know when the appointment is and any other information needed-Anastasiya V Hopkins, RMA

## 2018-03-03 NOTE — ED Triage Notes (Addendum)
Pt states that she has fibromyalgia, and is experiencing pain all over "really bad" today. Headache (began yesterday), pressure to posterior of head. 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 Nothing taken for pain today. Pt alert and oriented X4, active, cooperative, pt in NAD. RR even and unlabored, color WNL.

## 2018-03-03 NOTE — Telephone Encounter (Signed)
Katelyn Galloway and Katelyn Galloway spoke and they will look into why this office denied referrral

## 2018-03-03 NOTE — Discharge Instructions (Signed)
Continue previous medication and use oral spray as needed for mouth pain.  Follow ENT for definitive evaluation and treatment

## 2018-03-03 NOTE — ED Provider Notes (Addendum)
[facility] Emergency Department Provider Note   ____________________________________________   First MD Initiated Contact with Patient 03/03/18 1408     (approximate)  I have reviewed the triage vital signs and the nursing notes.   HISTORY  Chief Complaint Muscle Pain and Headache    HPI Katelyn Galloway is a 57 y.o. female patient presents exacerbation of her fibromyalgia.  Patient also states pressure to the back of her head and a burning sensation to her mouth.  Patient denies headache, vision disturbance, or vertigo.  No pelvis measures taken for complaints today.  Patient rates the pain as 8/10.  Patient described the pain as "generalized aching".  Past Medical History:  Diagnosis Date  . Anemia 1970  . Anxiety   . Arthritis   . Asthma    child  . Bipolar disorder (HCC)   . Bronchitis    "years ago"  . Bronchitis    chronic hx  . Chronic back pain    hx of  . Depression   . Fibromyalgia   . GERD (gastroesophageal reflux disease)    occ  . Headache(784.0)    migraines  . Heart palpitations    occ if get anxious- no tests  . History of kidney stones   . Hypothyroidism   . Irritable bowel syndrome (IBS)   . Seizures (HCC)    x 2 during pregnancy per pt was not diagnosed with eclampsia    Patient Active Problem List   Diagnosis Date Noted  . Bipolar disorder with current episode depressed (HCC) 05/05/2017  . Cluster b traits (borderline and histrionoc) 03/26/2017  . Asthma 03/24/2017  . Tobacco use disorder 03/24/2017  . Cannabis abuse 03/23/2017  . Hypothyroidism 05/15/2015  . GAD (generalized anxiety disorder) 07/10/2010  . Chronic bilateral low back pain 07/09/2009    Past Surgical History:  Procedure Laterality Date  . ABDOMINAL HYSTERECTOMY  1992  . BACK SURGERY     x2  . CESAREAN SECTION  1990  . CHOLECYSTECTOMY  2009 or 2010  . HARDWARE REMOVAL  12/29/2012   Procedure: HARDWARE REMOVAL;  Surgeon: Tia Alertavid S Jones,  MD;  Location: [facility];  Service: Neurosurgery;  Laterality: N/A;  Lumbar hardware extraction  . kindey stone removal    . KNEE ARTHROSCOPY     right knee  . SPINAL CORD STIMULATOR INSERTION N/A 01/25/2015   Procedure: LUMBAR SPINAL CORD STIMULATOR INSERTION;  Surgeon: Gwynne EdingerPaul C Harkins, MD;  Location: [facility];  Service: Neurosurgery;  Laterality: N/A;  . TUBAL LIGATION      Prior to Admission medications   Medication Sig Start Date End Date Taking? Authorizing Provider  albuterol (PROVENTIL HFA;VENTOLIN HFA) 108 (90 Base) MCG/ACT inhaler INHALE TWO PUFFS BY MOUTH EVERY 6 HOURS AS NEEDED FOR WHEEZING OR FOR SHORTNESS OF BREATH 01/18/18   Emi BelfastGessner, Deborah B, FNP  amitriptyline (ELAVIL) 25 MG tablet Take 25 mg by mouth. 09/10/17   [provider]  antiseptic oral rinse (BIOTENE) LIQD 15 mLs by Mouth Rinse route as needed for dry mouth.    [provider]  baclofen (LIORESAL) 10 MG tablet Take 1 tablet (10 mg total) by mouth every 12 (twelve) hours as needed for muscle spasms (and back pain). 01/10/18   Emi BelfastGessner, Deborah B, FNP  BIOTIN PO Take by mouth daily.    [provider]  carbamazepine (TEGRETOL) 200 MG tablet Take 1 tablet (200 mg total) by mouth at bedtime. For Pain/mood stabilization 05/21/17  [name] I, NP  cholecalciferol (VITAMIN D) 1000 units tablet Take 1 tablet (1,000 Units total) by mouth daily. For bone health 05/22/17   [name] I, NP  CHONDROITIN SULFATE PO Take by mouth daily.    [provider]  CRANBERRY PO Take by mouth daily.    [provider]  Cyanocobalamin (VITAMIN B 12 PO) Take by mouth daily.    [provider]  diazepam (VALIUM) 5 MG tablet Take 1 tablet (5 mg total) by mouth 2 (two) times daily as needed (severe anxiety/pt is on valium per [place] controlled substance database). 05/21/17   [name] I, NP  docusate sodium (COLACE) 100 MG capsule Take 1 capsule (100 mg total) by mouth daily. (May purchase from  over the counter): For constipation 05/22/17   [name] I, NP  gabapentin (NEURONTIN) 600 MG tablet Take 2 tablets (1,200 mg total) by mouth 3 (three) times daily. 01/19/18   Emi Belfast, FNP  levothyroxine (SYNTHROID, LEVOTHROID) 25 MCG tablet Take 1 tablet (25 mcg total) by mouth daily before breakfast. For low thyroid hormone 02/18/18   Emi Belfast, FNP  lidocaine (LIDODERM) 5 % Place 1 patch onto the skin daily. Remove & Discard patch within 12 hours or as directed by MD 01/18/18   Emi Belfast, FNP  loratadine (CLARITIN) 10 MG tablet Take 10 mg by mouth daily.    [provider]  meloxicam (MOBIC) 7.5 MG tablet Take 1-2 tablets (7.5-15 mg total) by mouth daily as needed for pain. 01/18/18   Emi Belfast, FNP  omeprazole (PRILOSEC) 40 MG capsule Take 1 capsule (40 mg total) by mouth daily. 01/18/18   Emi Belfast, FNP  OVER THE COUNTER MEDICATION True Life Supplement    [provider]  oxyCODONE-acetaminophen (PERCOCET/ROXICET) 5-325 MG tablet Take 1 tablet by mouth every 6 (six) hours as needed for severe pain. 03/03/18   Joni Reining, PA-C  polyethylene glycol [facility] Ethelene Hal) packet Take 17 g by mouth daily as needed for moderate constipation or severe constipation. (May purchase from over the counter): For constipation Patient not taking: Reported on 01/17/2018 05/21/17   [name] I, NP  prazosin (MINIPRESS) 2 MG capsule Take 1 capsule (2 mg total) by mouth at bedtime. For nightmares Patient not taking: Reported on 01/17/2018 09/20/17   Emi Belfast, FNP  SALINE MIST SPRAY NA Place into the nose daily.    [provider]  TURMERIC PO Take by mouth daily.    [provider]  venlafaxine XR (EFFEXOR-XR) 75 MG 24 hr capsule Take 3 capsules (225 mg total) by mouth daily with breakfast. 05/24/17   Truman Hayward, FNP    Allergies Iodine; Latex; and Tape  Family History  Problem Relation Age of Onset  . Depression  Mother   . Heart attack Mother   . Suicidality Father   . Heart disease Father   . Alcohol abuse Father   . Irritable bowel syndrome Father   . Anesthesia problems Neg Hx   . Colon cancer Neg Hx   . Esophageal cancer Neg Hx     Social History Social History   Tobacco Use  . Smoking status: Current Some Day Smoker    Packs/day: 0.50    Years: 38.00    Pack years: 19.00    Types: Cigarettes  . Smokeless tobacco: Never Used  Substance Use Topics  . Alcohol use: No    Frequency: Never  . Drug  use: Yes    Types: Marijuana    Review of Systems Constitutional: No fever/chills.  Generalized body aches. Eyes: No visual changes. ENT: Mouth pain.   Cardiovascular: Denies chest pain. Respiratory: Denies shortness of breath. Gastrointestinal: No abdominal pain.  No nausea, no vomiting.  No diarrhea.  No constipation. Genitourinary: Negative for dysuria. Musculoskeletal: Fibromyalgia. Skin: Negative for rash. Neurological: Negative for headaches, focal weakness or numbness. Psychiatric:Anxiety, bipolar, depression. Endocrine:Hypothyroidism.  Hematological/Lymphatic: Allergic/Immunilogical: See medication list. ____________________________________________   PHYSICAL EXAM:  VITAL SIGNS: ED Triage Vitals [03/03/18 1345]  Enc Vitals Group     BP 118/73     Pulse Rate 84     Resp 18     Temp 98.4 F (36.9 C)     Temp Source Oral     SpO2 99 %     Weight 149 lb (67.6 kg)     Height 5\' 4"  (1.626 m)     Head Circumference      Peak Flow      Pain Score 8     Pain Loc      Pain Edu?      Excl. in GC?    Constitutional: Alert and oriented. Well appearing and in no acute distress. Neck: No stridor. No cervical spine tenderness to palpation. Hematological/Lymphatic/Immunilogical: No cervical lymphadenopathy. Cardiovascular: Normal rate, regular rhythm. Grossly normal heart sounds.  Good peripheral circulation. Respiratory: Normal respiratory effort.  No retractions.  Lungs CTAB. Musculoskeletal: No obvious cervical or lumbar spine deformity.  Patient has diffuse guarding palpation along the cervical lumbar spine.  No lower extremity tenderness nor edema.  No joint effusions. Neurologic:  Normal speech and language. No gross focal neurologic deficits are appreciated. No gait instability. Skin:  Skin is warm, dry and intact. No rash noted. Psychiatric: Mood and affect are normal. Speech and behavior are normal.  ____________________________________________   LABS (all labs ordered are listed, but only abnormal results are displayed)  Labs Reviewed - No data to display ____________________________________________  EKG   ____________________________________________  RADIOLOGY  ED MD interpretation:    Official radiology report(s): No results found.  ____________________________________________   PROCEDURES  Procedure(s) performed: None  Procedures  Critical Care performed: No  ____________________________________________   INITIAL IMPRESSION / ASSESSMENT AND PLAN / ED COURSE  As part of my medical decision making, I reviewed the following data within the electronic MEDICAL RECORD NUMBER    Mouth pain with no visual lesions.  Musculoskeletal pain secondary to fibromyalgia.  Patient given discharge care instruction advised to follow-up with ENT clinic for definitive evaluation of mouth pain.  Continue previous medications and use mild spray Percocets as directed. After nurse went to discharge the patient she stated she is having lower abdominal pain which might which she believed  Might be an ovarian cyst.  Advised patient to follow-up with her GYN doctor and return back to ED if complaint worsens before she see her GYN doctor.   ____________________________________________   FINAL CLINICAL IMPRESSION(S) / ED DIAGNOSES  Final diagnoses:  Burning mouth syndrome  Fibromyalgia     ED Discharge Orders        Ordered     oxyCODONE-acetaminophen (PERCOCET/ROXICET) 5-325 MG tablet  Every 6 hours PRN     03/03/18 1454       Note:  This document was prepared using Dragon voice recognition software and may include unintentional dictation errors.    Joni Reining, PA-C 03/03/18 1508    Nita Sickle, MD 03/03/18 [phone]  Joni Reining, PA-C 03/03/18 1518    Nita Sickle, MD 03/09/18 [phone]

## 2018-03-03 NOTE — Telephone Encounter (Signed)
LMTCB for someone from Dr Yves Dillhasnis office to get information before calling patient back-Jontue Crumpacker Ander PurpuraV Krishawna Stiefel, RMA

## 2018-03-03 NOTE — Telephone Encounter (Signed)
Copied from CRM [phone]. Topic: Referral - Question >> Mar 03, 2018 12:34 PM Katelyn Galloway, [name] wrote: Reason for CRM:  Patient said that Dr Leone PayorGessner referred her to [facility] and they told her they will not see her bc she has an internal stem. [facility]. Please call patient at [phone] Patient said she doesn't know the name of the office that called her

## 2018-03-07 ENCOUNTER — Encounter: Payer: Self-pay | Admitting: Family Medicine

## 2018-03-07 ENCOUNTER — Other Ambulatory Visit: Payer: Self-pay | Admitting: Family Medicine

## 2018-03-07 DIAGNOSIS — R208 Other disturbances of skin sensation: Secondary | ICD-10-CM

## 2018-03-07 DIAGNOSIS — K1379 Other lesions of oral mucosa: Secondary | ICD-10-CM

## 2018-03-21 ENCOUNTER — Other Ambulatory Visit: Payer: Self-pay | Admitting: Family Medicine

## 2018-03-21 NOTE — Telephone Encounter (Signed)
Last OV and Rx 12/2017 #180. pls advise

## 2018-03-29 ENCOUNTER — Other Ambulatory Visit: Payer: Self-pay

## 2018-03-29 ENCOUNTER — Encounter (HOSPITAL_COMMUNITY): Payer: Self-pay | Admitting: *Deleted

## 2018-03-29 ENCOUNTER — Emergency Department (HOSPITAL_COMMUNITY)
Admission: EM | Admit: 2018-03-29 | Discharge: 2018-03-30 | Disposition: A | Discharge status: Other Acute Inpatient Hospital | Payer: Medicare Other | Attending: Emergency Medicine | Admitting: Emergency Medicine

## 2018-03-29 ENCOUNTER — Emergency Department (HOSPITAL_COMMUNITY): Payer: Medicare Other

## 2018-03-29 DIAGNOSIS — R109 Unspecified abdominal pain: Secondary | ICD-10-CM | POA: Insufficient documentation

## 2018-03-29 DIAGNOSIS — F314 Bipolar disorder, current episode depressed, severe, without psychotic features: Secondary | ICD-10-CM | POA: Insufficient documentation

## 2018-03-29 DIAGNOSIS — F1220 Cannabis dependence, uncomplicated: Secondary | ICD-10-CM | POA: Insufficient documentation

## 2018-03-29 DIAGNOSIS — F17210 Nicotine dependence, cigarettes, uncomplicated: Secondary | ICD-10-CM | POA: Insufficient documentation

## 2018-03-29 DIAGNOSIS — F3181 Bipolar II disorder: Secondary | ICD-10-CM | POA: Diagnosis not present

## 2018-03-29 DIAGNOSIS — J45909 Unspecified asthma, uncomplicated: Secondary | ICD-10-CM | POA: Insufficient documentation

## 2018-03-29 DIAGNOSIS — Z046 Encounter for general psychiatric examination, requested by authority: Secondary | ICD-10-CM | POA: Diagnosis present

## 2018-03-29 DIAGNOSIS — Z79899 Other long term (current) drug therapy: Secondary | ICD-10-CM | POA: Diagnosis not present

## 2018-03-29 DIAGNOSIS — E039 Hypothyroidism, unspecified: Secondary | ICD-10-CM | POA: Insufficient documentation

## 2018-03-29 LAB — URINALYSIS, ROUTINE W REFLEX MICROSCOPIC
BACTERIA UA: NONE SEEN
Bilirubin Urine: NEGATIVE
GLUCOSE, UA: NEGATIVE mg/dL
HGB URINE DIPSTICK: NEGATIVE
Ketones, ur: 5 mg/dL — AB
NITRITE: NEGATIVE
PH: 6 (ref 5.0–8.0)
Protein, ur: 30 mg/dL — AB
Specific Gravity, Urine: 1.013 (ref 1.005–1.030)
Squamous Epithelial / LPF: NONE SEEN

## 2018-03-29 LAB — CBC WITH DIFFERENTIAL/PLATELET
Basophils Absolute: 0 10*3/uL (ref 0.0–0.1)
Basophils Relative: 0 %
EOS PCT: 1 %
Eosinophils Absolute: 0.1 10*3/uL (ref 0.0–0.7)
HEMATOCRIT: 43.4 % (ref 36.0–46.0)
Hemoglobin: 15 g/dL (ref 12.0–15.0)
LYMPHS ABS: 1.4 10*3/uL (ref 0.7–4.0)
LYMPHS PCT: 9 %
MCH: 31.5 pg (ref 26.0–34.0)
MCHC: 34.6 g/dL (ref 30.0–36.0)
MCV: 91.2 fL (ref 78.0–100.0)
MONO ABS: 0.9 10*3/uL (ref 0.1–1.0)
MONOS PCT: 6 %
NEUTROS ABS: 12.6 10*3/uL — AB (ref 1.7–7.7)
Neutrophils Relative %: 84 %
PLATELETS: 311 10*3/uL (ref 150–400)
RBC: 4.76 MIL/uL (ref 3.87–5.11)
RDW: 12.9 % (ref 11.5–15.5)
WBC: 15 10*3/uL — ABNORMAL HIGH (ref 4.0–10.5)

## 2018-03-29 LAB — COMPREHENSIVE METABOLIC PANEL
ALT: 19 U/L (ref 14–54)
AST: 24 U/L (ref 15–41)
Albumin: 4.5 g/dL (ref 3.5–5.0)
Alkaline Phosphatase: 80 U/L (ref 38–126)
Anion gap: 14 (ref 5–15)
BUN: 16 mg/dL (ref 6–20)
CO2: 20 mmol/L — ABNORMAL LOW (ref 22–32)
Calcium: 9.5 mg/dL (ref 8.9–10.3)
Chloride: 104 mmol/L (ref 101–111)
Creatinine, Ser: 0.97 mg/dL (ref 0.44–1.00)
Glucose, Bld: 130 mg/dL — ABNORMAL HIGH (ref 65–99)
POTASSIUM: 3.8 mmol/L (ref 3.5–5.1)
Sodium: 138 mmol/L (ref 135–145)
Total Bilirubin: 0.5 mg/dL (ref 0.3–1.2)
Total Protein: 7.7 g/dL (ref 6.5–8.1)

## 2018-03-29 LAB — RAPID URINE DRUG SCREEN, HOSP PERFORMED
Amphetamines: NOT DETECTED
BENZODIAZEPINES: POSITIVE — AB
Barbiturates: NOT DETECTED
Cocaine: NOT DETECTED
OPIATES: NOT DETECTED
Tetrahydrocannabinol: POSITIVE — AB

## 2018-03-29 LAB — ACETAMINOPHEN LEVEL

## 2018-03-29 LAB — SALICYLATE LEVEL: Salicylate Lvl: <7 mg/dL (ref 2.8–30.0)

## 2018-03-29 LAB — LIPASE, BLOOD: LIPASE: 29 U/L (ref 11–51)

## 2018-03-29 NOTE — ED Notes (Signed)
US at bedside

## 2018-03-29 NOTE — ED Triage Notes (Signed)
Pt bib [employer] and is IVC'd.  According to IVC papers, pt has bi-polar disorder and manic depression.  She has not been taking her medications for the past two weeks and has poor personal hygiene. Pt was "hitting and screaming" at her spouse today.  Pt reported to [employer] that she can't take her medications because she can't afford them and that she was trying to get her spouse and spouses's son out of her house when they have been living there for years.  Pt currently calm and cooperative.

## 2018-03-29 NOTE — ED Notes (Signed)
Bed: WA28 Expected date:  Expected time:  Means of arrival:  Comments: Hold 
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
## 2018-03-29 NOTE — ED Notes (Signed)
[facility] Expected date:  Expected time:  Means of arrival:  Comments: rm 28

## 2018-03-30 ENCOUNTER — Encounter (HOSPITAL_COMMUNITY): Payer: Self-pay | Admitting: *Deleted

## 2018-03-30 ENCOUNTER — Other Ambulatory Visit: Payer: Self-pay

## 2018-03-30 ENCOUNTER — Inpatient Hospital Stay (HOSPITAL_COMMUNITY)
Admission: AD | Admit: 2018-03-30 | Discharge: 2018-04-01 | DRG: 885 | Disposition: A | Discharge status: Home / Self Care | Payer: Medicare Other | Attending: Psychiatry | Admitting: Psychiatry

## 2018-03-30 DIAGNOSIS — M542 Cervicalgia: Secondary | ICD-10-CM

## 2018-03-30 DIAGNOSIS — Z818 Family history of other mental and behavioral disorders: Secondary | ICD-10-CM

## 2018-03-30 DIAGNOSIS — F411 Generalized anxiety disorder: Secondary | ICD-10-CM | POA: Diagnosis present

## 2018-03-30 DIAGNOSIS — F1210 Cannabis abuse, uncomplicated: Secondary | ICD-10-CM | POA: Diagnosis present

## 2018-03-30 DIAGNOSIS — Z79899 Other long term (current) drug therapy: Secondary | ICD-10-CM

## 2018-03-30 DIAGNOSIS — Z811 Family history of alcohol abuse and dependence: Secondary | ICD-10-CM | POA: Diagnosis not present

## 2018-03-30 DIAGNOSIS — F419 Anxiety disorder, unspecified: Secondary | ICD-10-CM | POA: Diagnosis not present

## 2018-03-30 DIAGNOSIS — Z79890 Hormone replacement therapy (postmenopausal): Secondary | ICD-10-CM | POA: Diagnosis not present

## 2018-03-30 DIAGNOSIS — Z9049 Acquired absence of other specified parts of digestive tract: Secondary | ICD-10-CM | POA: Diagnosis not present

## 2018-03-30 DIAGNOSIS — J45909 Unspecified asthma, uncomplicated: Secondary | ICD-10-CM | POA: Diagnosis present

## 2018-03-30 DIAGNOSIS — F3181 Bipolar II disorder: Secondary | ICD-10-CM | POA: Diagnosis present

## 2018-03-30 DIAGNOSIS — F314 Bipolar disorder, current episode depressed, severe, without psychotic features: Secondary | ICD-10-CM | POA: Diagnosis not present

## 2018-03-30 DIAGNOSIS — F17210 Nicotine dependence, cigarettes, uncomplicated: Secondary | ICD-10-CM | POA: Diagnosis present

## 2018-03-30 DIAGNOSIS — E039 Hypothyroidism, unspecified: Secondary | ICD-10-CM | POA: Diagnosis present

## 2018-03-30 DIAGNOSIS — K219 Gastro-esophageal reflux disease without esophagitis: Secondary | ICD-10-CM | POA: Diagnosis present

## 2018-03-30 DIAGNOSIS — M797 Fibromyalgia: Secondary | ICD-10-CM | POA: Diagnosis present

## 2018-03-30 DIAGNOSIS — G4700 Insomnia, unspecified: Secondary | ICD-10-CM | POA: Diagnosis present

## 2018-03-30 DIAGNOSIS — Z630 Problems in relationship with spouse or partner: Secondary | ICD-10-CM | POA: Diagnosis not present

## 2018-03-30 DIAGNOSIS — F6081 Narcissistic personality disorder: Secondary | ICD-10-CM | POA: Diagnosis present

## 2018-03-30 DIAGNOSIS — R450 Nervousness: Secondary | ICD-10-CM | POA: Diagnosis not present

## 2018-03-30 DIAGNOSIS — Z90710 Acquired absence of both cervix and uterus: Secondary | ICD-10-CM

## 2018-03-30 DIAGNOSIS — G8929 Other chronic pain: Secondary | ICD-10-CM

## 2018-03-30 DIAGNOSIS — K589 Irritable bowel syndrome without diarrhea: Secondary | ICD-10-CM | POA: Diagnosis present

## 2018-03-30 DIAGNOSIS — F3163 Bipolar disorder, current episode mixed, severe, without psychotic features: Secondary | ICD-10-CM | POA: Diagnosis not present

## 2018-03-30 DIAGNOSIS — F1290 Cannabis use, unspecified, uncomplicated: Secondary | ICD-10-CM | POA: Diagnosis not present

## 2018-03-30 MED ORDER — VITAMIN D3 25 MCG (1000 UNIT) PO TABS
1000.0000 [IU] | ORAL_TABLET | Freq: Every day | ORAL | Status: DC
  Administered 2018-03-31 – 2018-04-01 (×2): 1000 [IU] via ORAL
  Filled 2018-03-30 (×4): qty 1

## 2018-03-30 MED ORDER — GABAPENTIN 600 MG PO TABS
1200.0000 mg | ORAL_TABLET | Freq: Three times a day (TID) | ORAL | Status: DC
  Administered 2018-03-31 – 2018-04-01 (×4): 1200 mg via ORAL
  Filled 2018-03-30 (×8): qty 2

## 2018-03-30 MED ORDER — ALUM & MAG HYDROXIDE-SIMETH 200-200-20 MG/5ML PO SUSP: 30.0000 mL | ORAL | Status: DC | PRN

## 2018-03-30 MED ORDER — AMITRIPTYLINE HCL 25 MG PO TABS
25.0000 mg | ORAL_TABLET | Freq: Every day | ORAL | Status: DC
  Administered 2018-03-30 – 2018-03-31 (×2): 25 mg via ORAL
  Filled 2018-03-30 (×5): qty 1

## 2018-03-30 MED ORDER — ZIPRASIDONE MESYLATE 20 MG IM SOLR: 20.0000 mg | Freq: Two times a day (BID) | INTRAMUSCULAR | Status: DC | PRN

## 2018-03-30 MED ORDER — LORAZEPAM 1 MG PO TABS
2.0000 mg | ORAL_TABLET | Freq: Once | ORAL | Status: DC
  Filled 2018-03-30: qty 2

## 2018-03-30 MED ORDER — ALBUTEROL SULFATE HFA 108 (90 BASE) MCG/ACT IN AERS: 2.0000 | INHALATION_SPRAY | RESPIRATORY_TRACT | Status: DC | PRN

## 2018-03-30 MED ORDER — HYDROXYZINE HCL 25 MG PO TABS: 25.0000 mg | ORAL_TABLET | Freq: Three times a day (TID) | ORAL | Status: DC | PRN

## 2018-03-30 MED ORDER — VENLAFAXINE HCL ER 75 MG PO CP24
225.0000 mg | ORAL_CAPSULE | Freq: Every day | ORAL | Status: DC
  Administered 2018-03-31 – 2018-04-01 (×2): 225 mg via ORAL
  Filled 2018-03-30: qty 1
  Filled 2018-03-30: qty 2
  Filled 2018-03-30 (×2): qty 1

## 2018-03-30 MED ORDER — LEVOTHYROXINE SODIUM 25 MCG PO TABS
25.0000 ug | ORAL_TABLET | Freq: Every day | ORAL | Status: DC
  Administered 2018-03-31 – 2018-04-01 (×2): 25 ug via ORAL
  Filled 2018-03-30 (×4): qty 1

## 2018-03-30 MED ORDER — TRAZODONE HCL 50 MG PO TABS: 50.0000 mg | ORAL_TABLET | Freq: Every evening | ORAL | Status: DC | PRN

## 2018-03-30 MED ORDER — MAGNESIUM HYDROXIDE 400 MG/5ML PO SUSP: 30.0000 mL | Freq: Every day | ORAL | Status: DC | PRN

## 2018-03-30 MED ORDER — ACETAMINOPHEN 325 MG PO TABS: 650.0000 mg | ORAL_TABLET | Freq: Four times a day (QID) | ORAL | Status: DC | PRN

## 2018-03-30 MED ORDER — CARBAMAZEPINE 200 MG PO TABS
200.0000 mg | ORAL_TABLET | Freq: Every day | ORAL | Status: DC
  Administered 2018-03-30 – 2018-03-31 (×2): 200 mg via ORAL
  Filled 2018-03-30 (×5): qty 1

## 2018-03-30 MED ORDER — DOCUSATE SODIUM 100 MG PO CAPS
100.0000 mg | ORAL_CAPSULE | Freq: Every day | ORAL | Status: DC
Start: 2018-03-31 — End: 2018-04-01
  Administered 2018-03-31 – 2018-04-01 (×2): 100 mg via ORAL
  Filled 2018-03-30 (×4): qty 1

## 2018-03-30 MED ORDER — LORAZEPAM 1 MG PO TABS: 1.0000 mg | ORAL_TABLET | ORAL | Status: DC | PRN

## 2018-03-30 MED ORDER — NICOTINE 21 MG/24HR TD PT24
21.0000 mg | MEDICATED_PATCH | Freq: Every day | TRANSDERMAL | Status: DC
  Administered 2018-03-31 – 2018-04-01 (×2): 21 mg via TRANSDERMAL
  Filled 2018-03-30 (×4): qty 1
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
## 2018-03-30 NOTE — Tx Team (Signed)
Initial Treatment Plan 03/30/2018 8:30 PM Katelyn Galloway [id number]    PATIENT STRESSORS: Financial difficulties Health problems Marital or family conflict   PATIENT STRENGTHS: Ability for insight Average or above average intelligence Capable of independent living Communication skills General fund of knowledge   PATIENT IDENTIFIED PROBLEMS: Relationship issues Depression "Need to talk to a Child psychotherapistsocial worker to try to get him out of my house"                     DISCHARGE CRITERIA:  Ability to meet basic life and health needs Improved stabilization in mood, thinking, and/or behavior Verbal commitment to aftercare and medication compliance  PRELIMINARY DISCHARGE PLAN: Attend aftercare/continuing care group Return to previous living arrangement  PATIENT/FAMILY INVOLVEMENT: This treatment plan has been presented to and reviewed with the patient, Katelyn Galloway, and/or family member, .  The patient and family have been given the opportunity to ask questions and make suggestions.  [name], [name], [place]RN 03/30/2018, 8:30 PM

## 2018-03-30 NOTE — ED Notes (Signed)
Up to the desk, nad.  Pt reports that she is not gluten free, but reports that she is cautious about what she eats because of her fibromyalgia.

## 2018-03-30 NOTE — ED Notes (Signed)
Donell SievertSpencer Simon, PA contacted for medication for patient increase anxiety and irritability. Telephone orders given for Ativan 2 mg PO x 1 dose orders read back and verified.

## 2018-03-30 NOTE — ED Notes (Signed)
Call [employer] at 1900 [facility]

## 2018-03-30 NOTE — ED Notes (Signed)
[employer] here to transport 
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
## 2018-03-30 NOTE — ED Notes (Signed)
Report called to [place] RN. 

## 2018-03-30 NOTE — ED Notes (Signed)
Pt transported to [facility] by [employer] for continuation of specialized care. Pt left in no acute distress. Belongings signed for and given to [employer] officer. Pt left in no acute distress. 

## 2018-03-30 NOTE — ED Notes (Signed)
Patient currently denies SI/HI and AVH at this time. Plan of care discussed. Encouragement and support provided and safety maintain. Q 15 min safety checks in place and video monitoring.

## 2018-03-30 NOTE — ED Notes (Signed)
[employer] contacted for transport 

## 2018-03-30 NOTE — ED Notes (Signed)
[facility] will call back for report 

## 2018-03-30 NOTE — ED Notes (Signed)
Up to the bathroom 

## 2018-03-30 NOTE — ED Notes (Addendum)
Up in hall, pt is aware that she is gong to be admitted to [facility]

## 2018-03-30 NOTE — ED Notes (Signed)
On the phone 

## 2018-03-30 NOTE — BH Assessment (Signed)
[facility]  Per Juanetta BeetsJacqueline Norman, DO, this pt requires psychiatric hospitalization at this time.  Malva LimesLinsey Strader, RN, [facility]; [facility] will be ready to receive pt at 16:00.  Pt presents under IVC initiated by pt's husband, and upheld by Dr Sharma CovertNorman, and [facility].  Pt's nurse, Wille CelesteJanie, has been notified, and agrees to call report to [phone].  Pt is to be transported via [employer].   Doylene Canninghomas Malin Cervini, [place] Behavioral Health Coordinator [phone]
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
## 2018-03-30 NOTE — ED Notes (Signed)
Up tot he bathroom to shower and change scrubs 

## 2018-03-30 NOTE — ED Notes (Signed)
Patient refused to take PO Ativan for increase anxiety and irritability. Writer explained benefits of medication and patient continue to refuse.

## 2018-03-31 DIAGNOSIS — F17210 Nicotine dependence, cigarettes, uncomplicated: Secondary | ICD-10-CM

## 2018-03-31 DIAGNOSIS — F1290 Cannabis use, unspecified, uncomplicated: Secondary | ICD-10-CM

## 2018-03-31 DIAGNOSIS — Z811 Family history of alcohol abuse and dependence: Secondary | ICD-10-CM

## 2018-03-31 DIAGNOSIS — F419 Anxiety disorder, unspecified: Secondary | ICD-10-CM

## 2018-03-31 DIAGNOSIS — R450 Nervousness: Secondary | ICD-10-CM

## 2018-03-31 DIAGNOSIS — Z818 Family history of other mental and behavioral disorders: Secondary | ICD-10-CM

## 2018-03-31 DIAGNOSIS — Z630 Problems in relationship with spouse or partner: Secondary | ICD-10-CM

## 2018-03-31 DIAGNOSIS — F3163 Bipolar disorder, current episode mixed, severe, without psychotic features: Secondary | ICD-10-CM

## 2018-03-31 LAB — URINE CULTURE: Culture: NO GROWTH

## 2018-03-31 MED ORDER — LIDOCAINE 5 % EX PTCH
1.0000 | MEDICATED_PATCH | CUTANEOUS | Status: DC
  Administered 2018-03-31: 1 via TRANSDERMAL
  Filled 2018-03-31 (×2): qty 1

## 2018-03-31 MED ORDER — BACLOFEN 10 MG PO TABS: 10.0000 mg | ORAL_TABLET | Freq: Two times a day (BID) | ORAL | Status: DC | PRN
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
## 2018-03-31 NOTE — Progress Notes (Signed)
The patient mentioned in group this evening that she had a good day since she was able to get to know her peers more today. She also credited her good day to being able to go outside for fresh air. Her goal for tomorrow is to find transportation home from the hospital and to begin working on herself rather than being held back by her boyfriend. She states that she has some support for her fibromyalgia from an online support group.

## 2018-03-31 NOTE — Progress Notes (Signed)
Patient attended wrap-up group and rated her day a 10. Goal is to get back on medications and decrease negative thoughts.

## 2018-03-31 NOTE — [facility] Group Notes (Signed)
[facility] LCSW Group Therapy 03/31/2018 1:15pm  Type of Therapy: Group Therapy- Feelings Around Discharge & Establishing a Supportive Framework  Participation Level:  None  Description of Group:   What is a supportive framework? What does it look like feel like and how do I discern it from and unhealthy non-supportive network? Learn how to cope when supports are not helpful and don't support you. Discuss what to do when your family/friends are not supportive.  Summary of Patient Progress  Cala BradfordKimberly came to group late and left after just a few minutes.  She was upset that the tech asked her to get off the phone and come to group.  She attempted to talk with a nurse who was sitting in the back of the room and after a few minutes was asked by the nurse to go back to her room to calm down.  She was not loud but did appear to be in distress from being asked to end her phone conversation.   Therapeutic Modalities:   Cognitive Behavioral Therapy Person-Centered Therapy Motivational Interviewing   Aram Beechamngel M Bunnie Rehberg, Student-Social Work 03/31/2018 2:39 PM

## 2018-03-31 NOTE — Progress Notes (Signed)
Patient ID: Katelyn CosierKimberly G Commons, female   DOB: 08/01/1961, 57 y.o.   MRN: [id number]   D: Patient is noted to interact well with peers in the milieu, but is hyper verbal and irritable at times with staff. Pt states she wants to move on from her current relationship and sell her land when she gets home. A: Encourage staff/peer interaction, medication compliance, and group participation. Administer medications as ordered, maintain Q 15 minute safety checks. R: Pt compliant with medications and attended group session. Pt denies SI at this time and verbally contracts for safety. No signs/symptoms of distress noted.
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
## 2018-03-31 NOTE — Progress Notes (Signed)
Recreation Therapy Notes  Date: 4.11.19 Time: 10:00 a.m.  Location: [address] Dayroom   Group Topic: Self-Awareness   Goal Area(s) Addresses:  Goal 1.1: To increase self-awareness   - Patient will identify the importance of self-awareness  - Patient will identify at least three things of how others view them  - Patient will identify at least three things of how they view themselves   Behavioral Response: Appropriate   Intervention: Craft   Activity: Patients were instructed to decorate the outside of their bag using the magazines provided to represent how other people see them and put things inside that represent how they really are on the inside. At the end of the session, patients shared their bags with one another.  Education: Chief Executive Officer Education  Clinical Observations/Feedback: Patient attended and participated appropriately during Recreation Therapy group treatment successfully identifying the importance of self awareness. Patient was able to identify at least three things of how others view them. Patient was able to identify at least three things of how they view themselves. Patient presented bags in front of peers. Patient participated during opening and closing discussion. Patient successfully met Goal 1.1 (See Above).   Ranell Patrick, Recreation Therapy Intern   Ranell Patrick 03/31/2018 12:52 PM

## 2018-03-31 NOTE — [facility] Suicide Risk Assessment (Signed)
[facility] INPATIENT:  Family/Significant Other Suicide Prevention Education  Suicide Prevention Education:  Patient Refusal for Family/Significant Other Suicide Prevention Education: The patient Katelyn Galloway has refused to provide written consent for family/significant other to be provided Family/Significant Other Suicide Prevention Education during admission and/or prior to discharge.  Physician notified.  Metro Kungngel M Abrielle Finck 03/31/2018, 10:33 AM

## 2018-03-31 NOTE — [facility] Suicide Risk Assessment (Signed)
[facility]   Nursing information obtained from:    Demographic factors:    Current Mental Status:    Loss Factors:    Historical Factors:    Risk Reduction Factors:     Total Time spent with patient: 1 hour Principal Problem: Bipolar 1 disorder, mixed, severe (HCC) Diagnosis:   Patient Active Problem List   Diagnosis Date Noted  . Bipolar 1 disorder, mixed, severe (HCC) [F31.63] 03/30/2018  . Bipolar disorder with current episode depressed (HCC) [F31.30] 05/05/2017  . 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 Asthma [J45.909] 03/24/2017  . Tobacco use disorder [F17.200] 03/24/2017  . Cannabis abuse [F12.10] 03/23/2017  . Hypothyroidism [E03.9] 05/15/2015  . GAD (generalized anxiety disorder) [F41.1] 07/10/2010  . Chronic bilateral low back pain [M54.5, G89.29] 07/09/2009   Subjective Data: See H&P for details  Continued Clinical Symptoms:  Alcohol Use Disorder Identification Test Final Score (AUDIT): 0 The "Alcohol Use Disorders Identification Test", Guidelines for Use in Primary Care, Second Edition.  World Science writerHealth Organization [facility](WHO). Score between 0-7:  no or low risk or alcohol related problems. Score between 8-15:  moderate risk of alcohol related problems. Score between 16-19:  high risk of alcohol related problems. Score 20 or above:  warrants further diagnostic evaluation for alcohol dependence and treatment.   CLINICAL FACTORS:   Severe Anxiety and/or Agitation Bipolar Disorder:   Mixed State Alcohol/Substance Abuse/Dependencies More than one psychiatric diagnosis Medical Diagnoses and Treatments/Surgeries   Psychiatric Specialty Exam: Physical Exam  Nursing note and vitals reviewed.   ROS - See H&P for details  Blood pressure (!) 155/87, pulse 61, temperature 98.2 F (36.8 C), temperature source Oral, resp. rate 18, height 5\' 4"  (1.626 m), weight 65.8 kg (145 lb).Body mass index is 24.89 kg/m.     COGNITIVE FEATURES THAT CONTRIBUTE TO RISK:  Closed-mindedness    SUICIDE RISK:   Minimal: No identifiable suicidal ideation.  Patients presenting with no risk factors but with morbid ruminations; may be classified as minimal risk based on the severity of the depressive symptoms  PLAN OF CARE: See H&P for details  I certify that inpatient services furnished can reasonably be expected to improve the patient's condition.   Micheal Likenshristopher T Javis Abboud, MD 03/31/2018, 3:18 PM
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
## 2018-04-01 MED ORDER — LEVOTHYROXINE SODIUM 25 MCG PO TABS: 25.0000 ug | ORAL_TABLET | Freq: Every day | ORAL | Status: DC

## 2018-04-01 MED ORDER — BACLOFEN 10 MG PO TABS: 10.0000 mg | ORAL_TABLET | Freq: Two times a day (BID) | ORAL | 0 refills | Status: DC | PRN

## 2018-04-01 MED ORDER — GABAPENTIN 600 MG PO TABS: 1200.0000 mg | ORAL_TABLET | Freq: Three times a day (TID) | ORAL | 0 refills | Status: DC

## 2018-04-01 MED ORDER — VENLAFAXINE HCL ER 75 MG PO CP24: 225.0000 mg | ORAL_CAPSULE | Freq: Every day | ORAL | 0 refills | Status: DC

## 2018-04-01 MED ORDER — TRAZODONE HCL 50 MG PO TABS: 50.0000 mg | ORAL_TABLET | Freq: Every evening | ORAL | 0 refills | Status: DC | PRN

## 2018-04-01 MED ORDER — HYDROXYZINE HCL 25 MG PO TABS: 25.0000 mg | ORAL_TABLET | Freq: Three times a day (TID) | ORAL | 0 refills | Status: DC | PRN

## 2018-04-01 MED ORDER — ALBUTEROL SULFATE HFA 108 (90 BASE) MCG/ACT IN AERS: 2.0000 | INHALATION_SPRAY | RESPIRATORY_TRACT | Status: DC | PRN

## 2018-04-01 MED ORDER — LIDOCAINE 5 % EX PTCH: 1.0000 | MEDICATED_PATCH | CUTANEOUS | 0 refills | Status: DC

## 2018-04-01 MED ORDER — AMITRIPTYLINE HCL 25 MG PO TABS: 25.0000 mg | ORAL_TABLET | Freq: Every day | ORAL | 0 refills | Status: DC

## 2018-04-01 MED ORDER — VITAMIN D3 25 MCG (1000 UNIT) PO TABS: 1000.0000 [IU] | ORAL_TABLET | Freq: Every day | ORAL | Status: AC

## 2018-04-01 MED ORDER — CARBAMAZEPINE 200 MG PO TABS: 200.0000 mg | ORAL_TABLET | Freq: Every day | ORAL | 0 refills | Status: DC

## 2018-04-01 MED ORDER — DOCUSATE SODIUM 100 MG PO CAPS: 100.0000 mg | ORAL_CAPSULE | Freq: Every day | ORAL | 0 refills | Status: AC

## 2018-04-01 MED ORDER — NICOTINE 21 MG/24HR TD PT24: 21.0000 mg | MEDICATED_PATCH | Freq: Every day | TRANSDERMAL | 0 refills | Status: DC

## 2018-04-01 NOTE — Progress Notes (Signed)
  [facility] Dis[facility] :  Will you be returning to the same living situation after discharge:  Yes,  Home  At discharge, do you have transportation home?: Yes,  Taxi Service Do you have the ability to pay for your medications: Yes,  Insurance   Release of information consent forms completed and in the chart;  Patient's signature needed at discharge.  Patient to Follow up at: Follow-up Information    [facility] Follow up on 04/07/2018.   Why:  Thursday at 8AM with Nicole Cellaorothy for your hospital follow up appointment.  Bring along ID, insurance card and hospital d/c paperwork  Contact information: [address] [place] [address] [phone]           Next level of care provider [facility]:no  Safety Planning and Suicide Prevention discussed: Yes,  Yes  Have you used any form of tobacco in the last 30 days? (Cigarettes, Smokeless Tobacco, Cigars, and/or Pipes): Yes  Has patient been referred to the Quitline?: Patient refused referral  Patient has been referred for addiction treatment: Pt. refused referral  Aram BeechamAngel M Tauriel Scronce, Student-Social Work 04/01/2018, 9:36 AM

## 2018-04-01 NOTE — Plan of Care (Signed)
4.12.19. Pt. Engaged with peers and staff in pro-social manner at least 2x within 5 RT group sessions

## 2018-04-01 NOTE — Progress Notes (Addendum)
Recreation Therapy Notes  Date: 4.12.19 Time: 10 a.m. Location: [address] Dayroom   Group Topic: Stress Management, Forgiveness    Goal Area(s) Addresses:  Goal 1.1: To reduce stress  -Patient will identify how forgiveness can be beneficial  -Patient will identify the importance of stress management  -Patient will participate during Recreation Therapy group tx.    Behavioral Response: Engaged   Intervention: Stress Management   Activity: Meditation- Patients were in a peaceful environment with soft lighting enhancing patients mood. Patients listened to a forgiveness meditation on the calm app to help decrease stress levels   Education: Stress Management, Discharge Planning.    Education Outcome: Acknowledges edcuation/In group clarification offered/Needs additional education   Clinical Observations/Feedback:: Patient attended and participated appropriately during Recreation Therapy group treatment successfully identifying how forgiveness can be beneficial. Patient was able to identify the importance of stress management. Patient participated during opening and closing discussion. Patient successfully met Goal 1.1 (See Above)    Ranell Patrick, Recreation Therapy Intern   Ranell Patrick 04/01/2018 11:13 AM

## 2018-04-01 NOTE — Progress Notes (Signed)
Patient ID: Katelyn CosierKimberly G Galloway, female   DOB: 11/03/1961, 57 y.o.   MRN: [id number] Patient discharged to home/self care.  Patient was thankful for her stay here and stated that her treatment has helped her improve her own desires for wellness.  Patient denies SI, HI and AVH upon discharge.   Patient acknowledged understanding of all discharge instructions and receipt of all belongings.

## 2018-04-01 NOTE — Tx Team (Addendum)
Interdisciplinary Treatment and Diagnostic Plan Update  04/01/2018 Time of Session: 9:20 AM  Katelyn Galloway MRN: [id number]  Principal Diagnosis: Bipolar 1 disorder, mixed, severe (HCC)  Secondary Diagnoses: Principal Problem:   Bipolar 1 disorder, mixed, severe (HCC) Active Problems:   Cannabis abuse   Current Medications:  Current Facility-Administered Medications  Medication Dose Route Frequency Provider Last Rate Last Dose  . acetaminophen (TYLENOL) tablet 650 mg  650 mg Oral Q6H PRN Laveda Abbe, NP      . albuterol (PROVENTIL HFA;VENTOLIN HFA) 108 (90 Base) MCG/ACT inhaler 2 puff  2 puff Inhalation Q4H PRN Laveda Abbe, NP      . alum & mag hydroxide-simeth (MAALOX/MYLANTA) 200-200-20 MG/5ML suspension 30 mL  30 mL Oral Q4H PRN Laveda Abbe, NP      . amitriptyline (ELAVIL) tablet 25 mg  25 mg Oral QHS Laveda Abbe, NP   25 mg at 03/31/18 2235  . baclofen (LIORESAL) tablet 10 mg  10 mg Oral BID PRN Micheal Likens, MD      . carbamazepine (TEGRETOL) tablet 200 mg  200 mg Oral QHS Laveda Abbe, NP   200 mg at 03/31/18 2235  . cholecalciferol (VITAMIN D) tablet 1,000 Units  1,000 Units Oral Daily Laveda Abbe, NP   1,000 Units at 04/01/18 0731  . docusate sodium (COLACE) capsule 100 mg  100 mg Oral Daily Laveda Abbe, NP   100 mg at 04/01/18 0731  . gabapentin (NEURONTIN) tablet 1,200 mg  1,200 mg Oral TID Laveda Abbe, NP   1,200 mg at 04/01/18 0731  . hydrOXYzine (ATARAX/VISTARIL) tablet 25 mg  25 mg Oral TID PRN Laveda Abbe, NP      . levothyroxine (SYNTHROID, LEVOTHROID) tablet 25 mcg  25 mcg Oral QAC breakfast Laveda Abbe, NP   25 mcg at 04/01/18 4098  . lidocaine (LIDODERM) 5 % 1 patch  1 patch Transdermal Q24H Micheal Likens, MD   1 patch at 03/31/18 1656  . ziprasidone (GEODON) injection 20 mg  20 mg Intramuscular Q12H PRN Laveda Abbe, NP       And  .  LORazepam (ATIVAN) tablet 1 mg  1 mg Oral PRN Laveda Abbe, NP      . magnesium hydroxide (MILK OF MAGNESIA) suspension 30 mL  30 mL Oral Daily PRN Laveda Abbe, NP      . nicotine (NICODERM CQ - dosed in mg/24 hours) patch 21 mg  21 mg Transdermal Daily Micheal Likens, MD   21 mg at 04/01/18 0732  . traZODone (DESYREL) tablet 50 mg  50 mg Oral QHS PRN Laveda Abbe, NP      . venlafaxine XR [facility]) 24 hr capsule 225 mg  225 mg Oral Q breakfast Laveda Abbe, NP   225 mg at 04/01/18 1191    PTA Medications: Medications Prior to Admission  Medication Sig Dispense Refill Last Dose  . albuterol (PROVENTIL HFA;VENTOLIN HFA) 108 (90 Base) MCG/ACT inhaler INHALE TWO PUFFS BY MOUTH EVERY 6 HOURS AS NEEDED FOR WHEEZING OR FOR SHORTNESS OF BREATH 1 Inhaler 5 Past Month at Unknown time  . amitriptyline (ELAVIL) 25 MG tablet Take 25 mg by mouth at bedtime.    Past Week at Unknown time  . baclofen (LIORESAL) 10 MG tablet Take one tablet (10 mg total) by mouth every 12 (twelve) hours as needed for muscle spasms (and back pain). 60 tablet 3 Past Week at  Unknown time  . BIOTIN PO Take 1 tablet by mouth daily.    Past Week at Unknown time  . carbamazepine (TEGRETOL) 200 MG tablet Take 1 tablet (200 mg total) by mouth at bedtime. For Pain/mood stabilization 30 tablet 0 Past Week at Unknown time  . cholecalciferol (VITAMIN D) 1000 units tablet Take 1 tablet (1,000 Units total) by mouth daily. For bone health 30 tablet 0 Past Week at Unknown time  . CHONDROITIN SULFATE PO Take 1 tablet by mouth daily.    Past Week at Unknown time  . CRANBERRY PO Take 1 tablet by mouth daily.    Past Week at Unknown time  . Cyanocobalamin (VITAMIN B 12 PO) Take 1 tablet by mouth daily.    Past Week at Unknown time  . docusate sodium (COLACE) 100 MG capsule Take 1 capsule (100 mg total) by mouth daily. (May purchase from over the counter): For constipation 1 capsule 0 Past Week at  Unknown time  . gabapentin (NEURONTIN) 600 MG tablet Take 2 tablets (1,200 mg total) by mouth 3 (three) times daily. 180 tablet 1 Past Week at Unknown time  . levothyroxine (SYNTHROID, LEVOTHROID) 25 MCG tablet Take 1 tablet (25 mcg total) by mouth daily before breakfast. For low thyroid hormone 90 tablet 1 Past Week at Unknown time  . lidocaine (LIDODERM) 5 % Place 1 patch onto the skin daily. Remove & Discard patch within 12 hours or as directed by MD 30 patch 5 Past Month at Unknown time  . meloxicam (MOBIC) 7.5 MG tablet Take 1-2 tablets (7.5-15 mg total) by mouth daily as needed for pain. 120 tablet 1 Past Week at Unknown time  . omeprazole (PRILOSEC) 40 MG capsule Take 1 capsule (40 mg total) by mouth daily. 90 capsule 3 Past Month at Unknown time  . oxyCODONE-acetaminophen (PERCOCET/ROXICET) 5-325 MG tablet Take 1 tablet by mouth every 6 (six) hours as needed for severe pain. (Patient not taking: Reported on 03/29/2018) 12 tablet 0 Not Taking at Unknown time  . polyethylene glycol (MIRALAX / GLYCOLAX) packet Take 17 g by mouth daily as needed for moderate constipation or severe constipation. (May purchase from over the counter): For constipation (Patient not taking: Reported on 01/17/2018) 14 each 0 Not Taking  . prazosin (MINIPRESS) 2 MG capsule Take 1 capsule (2 mg total) by mouth at bedtime. For nightmares (Patient not taking: Reported on 01/17/2018) 30 capsule 0 Not Taking  . TURMERIC PO Take 1 tablet by mouth daily.    Past Week at Unknown time  . venlafaxine XR (EFFEXOR-XR) 75 MG 24 hr capsule Take 3 capsules (225 mg total) by mouth daily with breakfast. 45 capsule 0 Past Week at Unknown time    Treatment Modalities: Medication Management, Group therapy, Case management,  1 to 1 session with clinician, Psychoeducation, Recreational therapy.  Patient Stressors: Financial difficulties Health problems Marital or family conflict  Patient Strengths: Ability for insight Average or above  average intelligence Capable of independent living Wellsite geologistCommunication skills General fund of knowledge   Physician Treatment Plan for Primary Diagnosis: Bipolar 1 disorder, mixed, severe (HCC) Long Term Goal(s): Improvement in symptoms so as ready for discharge  Short Term Goals: Ability to identify and develop effective coping behaviors will improve Ability to demonstrate self-control will improve  Medication Management: Evaluate patient's response, side effects, and tolerance of medication regimen.  Therapeutic Interventions: 1 to 1 sessions, Unit Group sessions and Medication administration.  Evaluation of Outcomes: Progressing  Physician Treatment Plan for Secondary  Diagnosis: Principal Problem:   Bipolar 1 disorder, mixed, severe (HCC) Active Problems:   Cannabis abuse  Long Term Goal(s): Improvement in symptoms so as ready for discharge  Short Term Goals: Ability to identify and develop effective coping behaviors will improve Ability to demonstrate self-control will improve  Medication Management: Evaluate patient's response, side effects, and tolerance of medication regimen.  Therapeutic Interventions: 1 to 1 sessions, Unit Group sessions and Medication administration.  Evaluation of Outcomes: Progressing   RN Treatment Plan for Primary Diagnosis: Bipolar 1 disorder, mixed, severe (HCC) Long Term Goal(s): Knowledge of disease and therapeutic regimen to maintain health will improve  Short Term Goals: Ability to verbalize frustration and anger appropriately will improve, Ability to participate in decision making will improve, Ability to verbalize feelings will improve, Ability to identify and develop effective coping behaviors will improve and Compliance with prescribed medications will improve  Medication Management: RN will administer medications as ordered by provider, will assess and evaluate patient's response and provide education to patient for prescribed medication. RN  will report any adverse and/or side effects to prescribing provider.  Therapeutic Interventions: 1 on 1 counseling sessions, Psychoeducation, Medication administration, Evaluate responses to treatment, Monitor vital signs and CBGs as ordered, Perform/monitor CIWA, COWS, AIMS and Fall Risk screenings as ordered, Perform wound care treatments as ordered.  Evaluation of Outcomes: Progressing   LCSW Treatment Plan for Primary Diagnosis: Bipolar 1 disorder, mixed, severe (HCC) Long Term Goal(s): Safe transition to appropriate next level of care at discharge, Engage patient in therapeutic group addressing interpersonal concerns.  Short Term Goals: Engage patient in aftercare planning with referrals and resources, Increase ability to appropriately verbalize feelings, Facilitate acceptance of mental health diagnosis and concerns, Identify triggers associated with mental health/substance abuse issues and Increase skills for wellness and recovery  Therapeutic Interventions: Assess for all discharge needs, 1 to 1 time with Social worker, Explore available resources and support systems, Assess for adequacy in community support network, Educate family and significant other(s) on suicide prevention, Complete Psychosocial Assessment, Interpersonal group therapy.  Evaluation of Outcomes: Progressing   Progress in Treatment: Attending groups: Yes Participating in groups: Yes Taking medication as prescribed: Yes Toleration of medication: Yes, no side effects reported at this time Family/Significant other contact made: No Patient understands diagnosis: Yes AEB asking for an outpatient referral  Discussing patient identified problems/goals with staff: Yes Medical problems stabilized or resolved: Yes Denies suicidal/homicidal ideation: Yes Issues/concerns per patient self-inventory: None Other: N/A  New problem(s) identified: None identified at this time.   New Short Term/Long Term Goal(s): "Take time to  figure out my plan for the future".   Discharge Plan or Barriers: Upon discharge pt will return home with her ex-fiance and will [facility] up at [facility].   Reason for Continuation of Hospitalization: Depression Medication stabilization   Estimated Length of Stay: Adequate for discharge 04/01/18  Attendees: Patient: Ondria Oswald 04/01/2018  9:20 AM  Physician: Jolyne Loa, MD 04/01/2018  9:20 AM  Nursing: Estella Husk, RN 04/01/2018  9:20 AM  RN Care Manager: Onnie Boer, RN 04/01/2018  9:20 AM  Social Worker: Richelle Ito, LCSW; Melba Coon, Social Work Intern 04/01/2018  9:20 AM  Recreational Therapist: Caroll Rancher, LRT 04/01/2018  9:20 AM  Other: Tomasita Morrow, P4CC 04/01/2018  9:20 AM  Other:  04/01/2018  9:20 AM  Other: 04/01/2018  9:20 AM    Scribe for Treatment Team: Aram Beecham, Student-Social Work 04/01/2018 9:20 AM
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
## 2018-04-01 NOTE — Progress Notes (Signed)
Recreation Therapy Notes  INPATIENT RECREATION TR PLAN  Patient Details Name: NIRA VISSCHER MRN: [id number] DOB: [birth year] Today's Date: 04/01/2018  Rec Therapy Plan Is patient appropriate for Therapeutic Recreation?: Yes Treatment times per week: At least three  Estimated Length of Stay: 5-7 days  TR Treatment/Interventions: Group participation (Approrpiate participation in recreation Therapy group tx.)  Discharge Criteria Pt will be discharged from therapy if:: Discharged Treatment plan/goals/alternatives discussed and agreed upon by:: Patient/family  Discharge Summary Short term goals set: See Care Plan  Short term goals met: Complete Progress toward goals comments: Groups attended Which groups?: Stress management, Self-Awareness Therapeutic equipment acquired: None  Reason patient discharged from therapy: Discharge from hospital Pt/family agrees with progress & goals achieved: Yes Date patient discharged from therapy: 04/01/18  Ranell Patrick, Recreation Therapy Intern   Ranell Patrick 04/01/2018, 3:06 PM
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
## 2018-05-04 ENCOUNTER — Telehealth: Payer: Self-pay | Admitting: Family Medicine

## 2018-05-04 NOTE — Telephone Encounter (Signed)
Copied from CRM [id number]. Topic: Quick Communication - See Telephone Encounter >> May 04, 2018 11:54 AM Jolayne Haines L wrote: CRM for notification. See Telephone encounter for: 05/04/18.  Beth from [facility] wants to know if she can switch the manufacture for her levothyroxine (SYNTHROID, LEVOTHROID) 25 MCG tablet Please advise.  Call back is [phone] fax [phone]

## 2018-05-04 NOTE — Telephone Encounter (Signed)
Please call patient's pharmacy and give them verbal to change levothyroxine manufacturer.

## 2018-05-04 NOTE — Telephone Encounter (Signed)
Called and spoke to Bascom giving her verbal orders.

## 2018-05-17 NOTE — Telephone Encounter (Signed)
See below crm Appointment 5/29 @ 12  Pt also stated she is having right side pain near ovary area  Copied from CRM #[id number]. Topic: General - Other >> May 17, 2018 11:40 AM Gerrianne Scale wrote: Reason for CRM: pt calling to let Leone Payor know that her insurance [facility] social security came to her home and they have some concerns that they would like for the pt to have a sleep study and low dose CT chest done Pap smear  check labs Hep C organ donor and may have been exposed to Hep C Thyroid and Lipids and check urine because protein was found in urine please give pt a call back at [phone]

## 2018-05-18 ENCOUNTER — Ambulatory Visit (INDEPENDENT_AMBULATORY_CARE_PROVIDER_SITE_OTHER): Payer: Medicare Other | Admitting: Family Medicine

## 2018-05-18 ENCOUNTER — Encounter: Payer: Self-pay | Admitting: Family Medicine

## 2018-05-18 VITALS — BP 122/84 | HR 101 | Temp 98.3°F | Ht 64.0 in | Wt 145.0 lb

## 2018-05-18 DIAGNOSIS — M797 Fibromyalgia: Secondary | ICD-10-CM

## 2018-05-18 DIAGNOSIS — F411 Generalized anxiety disorder: Secondary | ICD-10-CM

## 2018-05-18 DIAGNOSIS — Z1159 Encounter for screening for other viral diseases: Secondary | ICD-10-CM

## 2018-05-18 DIAGNOSIS — R1031 Right lower quadrant pain: Secondary | ICD-10-CM

## 2018-05-18 DIAGNOSIS — Z122 Encounter for screening for malignant neoplasm of respiratory organs: Secondary | ICD-10-CM
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
## 2018-05-18 NOTE — Patient Instructions (Addendum)
Please call and schedule an appointment for screening mammogram. A referral is not needed.  [phone] [phone]   Please stop at the front and see the referral coordinator  I'll see you back in 5-6 months

## 2018-05-19 ENCOUNTER — Encounter: Payer: Self-pay | Admitting: Family Medicine

## 2018-05-19 LAB — HEPATITIS C ANTIBODY
HEP C AB: NONREACTIVE
SIGNAL TO CUT-OFF: 0.02 (ref <1.00)

## 2018-05-21 ENCOUNTER — Other Ambulatory Visit: Payer: Self-pay | Admitting: Family Medicine

## 2018-05-21 DIAGNOSIS — F17200 Nicotine dependence, unspecified, uncomplicated: Secondary | ICD-10-CM

## 2018-05-21 DIAGNOSIS — Z122 Encounter for screening for malignant neoplasm of respiratory organs: Secondary | ICD-10-CM

## 2018-05-25 ENCOUNTER — Telehealth: Payer: Self-pay | Admitting: *Deleted

## 2018-05-25 DIAGNOSIS — Z87891 Personal history of nicotine dependence: Secondary | ICD-10-CM

## 2018-05-25 DIAGNOSIS — Z122 Encounter for screening for malignant neoplasm of respiratory organs: Secondary | ICD-10-CM

## 2018-05-25 NOTE — Telephone Encounter (Signed)
Received referral for initial lung cancer screening scan. Contacted patient and obtained smoking history,(current, 43 pack year) as well as answering questions related to screening process. Patient denies signs of lung cancer such as weight loss or hemoptysis. Patient denies comorbidity that would prevent curative treatment if lung cancer were found. Patient is scheduled for shared decision making visit and CT scan on 06/09/18.

## 2018-06-01 ENCOUNTER — Other Ambulatory Visit: Payer: Self-pay | Admitting: Family Medicine

## 2018-06-01 ENCOUNTER — Other Ambulatory Visit: Payer: Self-pay | Admitting: Emergency Medicine

## 2018-06-01 ENCOUNTER — Telehealth: Payer: Self-pay | Admitting: Family Medicine

## 2018-06-01 MED ORDER — AMITRIPTYLINE HCL 25 MG PO TABS
25.0000 mg | ORAL_TABLET | Freq: Every day | ORAL | 1 refills | Status: DC
Start: 2018-06-01 — End: 2018-06-01

## 2018-06-01 MED ORDER — AMITRIPTYLINE HCL 25 MG PO TABS: 25.0000 mg | ORAL_TABLET | Freq: Every day | ORAL | 1 refills | Status: DC

## 2018-06-01 NOTE — Telephone Encounter (Signed)
Copied from CRM [id number] [id number]#[id number]. Topic: Quick Communication - Rx Refill/Question >> Jun 01, 2018 11:42 AM Rudi CocoLathan, Omere Marti M, NT wrote: Medication: amitriptyline (ELAVIL) 25 MG tablet [[id number]][[id number]]   Has the patient contacted their pharmacy? yes (Agent: If no, request that the patient contact the pharmacy for the refill.) (Agent: If yes, when and what did the pharmacy advise?)  Preferred Pharmacy (with phone number or street name): [facility] [place] [address] [place] [address] Phone: [phone] Fax: [phone]    Agent: Please be advised that RX refills may take up to 3 business days. We ask that you follow-up with your pharmacy.

## 2018-06-01 NOTE — Telephone Encounter (Signed)
Please Advise

## 2018-06-01 NOTE — Telephone Encounter (Signed)
Refill sent to pharmacy.   

## 2018-06-01 NOTE — Telephone Encounter (Signed)
Requesting refill of Elavil  Historical Provider  LOV 05/18/18   D. Leone PayorGessner  [facility]  #30  0 refills   [facility] [place] [address] [place] [address] Phone: [phone] Fax: [phone]

## 2018-06-08 ENCOUNTER — Encounter: Payer: Self-pay | Admitting: Oncology

## 2018-06-09 ENCOUNTER — Ambulatory Visit
Admission: RE | Admit: 2018-06-09 | Discharge: 2018-06-09 | Disposition: A | Discharge status: Home / Self Care | Payer: Medicare Other | Source: Ambulatory Visit | Attending: Nurse Practitioner | Admitting: Nurse Practitioner

## 2018-06-09 ENCOUNTER — Inpatient Hospital Stay: Payer: Medicare Other | Attending: Nurse Practitioner | Admitting: Oncology

## 2018-06-09 DIAGNOSIS — R918 Other nonspecific abnormal finding of lung field: Secondary | ICD-10-CM | POA: Diagnosis not present

## 2018-06-09 DIAGNOSIS — Z122 Encounter for screening for malignant neoplasm of respiratory organs: Secondary | ICD-10-CM | POA: Diagnosis not present

## 2018-06-09 DIAGNOSIS — F17210 Nicotine dependence, cigarettes, uncomplicated: Secondary | ICD-10-CM | POA: Diagnosis not present

## 2018-06-09 DIAGNOSIS — Z87891 Personal history of nicotine dependence: Secondary | ICD-10-CM

## 2018-06-09 NOTE — Progress Notes (Signed)
In accordance with CMS guidelines, patient has met eligibility criteria including age, absence of signs or symptoms of lung cancer.  Social History   Tobacco Use  . Smoking status: Current Some Day Smoker    Packs/day: 1.00    Years: 43.00    Pack years: 43.00    Types: Cigarettes  . Smokeless tobacco: Never Used  Substance Use Topics  . Alcohol use: No    Frequency: Never  . Drug use: Yes    Types: Marijuana     A shared decision-making session was conducted prior to the performance of CT scan. This includes one or more decision aids, includes benefits and harms of screening, follow-up diagnostic testing, over-diagnosis, false positive rate, and total radiation exposure.  Counseling on the importance of adherence to annual lung cancer LDCT screening, impact of co-morbidities, and ability or willingness to undergo diagnosis and treatment is imperative for compliance of the program.  Counseling on the importance of continued smoking cessation for former smokers; the importance of smoking cessation for current smokers, and information about tobacco cessation interventions have been given to patient including [place] and [facility] quit [place] programs.  Written order for lung cancer screening with LDCT has been given to the patient and any and all questions have been answered to the best of my abilities.   Yearly follow up will be coordinated by Burgess Estelle, Thoracic Navigator.  Faythe Casa, NP 06/09/2018 2:47 PM

## 2018-06-13 ENCOUNTER — Encounter: Payer: Self-pay | Admitting: *Deleted

## 2018-06-14 ENCOUNTER — Telehealth: Payer: Self-pay | Admitting: Family Medicine

## 2018-06-14 NOTE — Telephone Encounter (Signed)
Copied from CRM [id number]. Topic: Quick Communication - See Telephone Encounter >> Jun 14, 2018  4:02 PM Lorrine KinMcGee, Kellin Fifer B, NT wrote: CRM for notification. See Telephone encounter for: 06/14/18. Marylene LandAngela with [facility] calling to check on the prior authorization on the lidocaine (LIDODERM) 5 % patches.  CB#: [phone]

## 2018-06-15 NOTE — Telephone Encounter (Signed)
Called and spoke with Pharmacist informing her that I have not received PA paper work. Awaiting Fax.

## 2018-06-16 ENCOUNTER — Ambulatory Visit: Payer: Medicare Other | Admitting: Psychology

## 2018-06-16 NOTE — Telephone Encounter (Signed)
PA was done for Lidoderm 5% patch back in 01/2018 and was denied.  It states Lidocaine patch is denied because the us is not supported by the FDA or by one of the Medicare approved references for treating fibromyalgia.  If you do not agree with this denial you can write a letter of appeal within 60 days of this notice.  So it is too late to do an appeal.  Please advise what you would like to do.  Re-submit another PA with a different Dx or have her buy the OTC 4% at her pharmacy.  Please advise.

## 2018-06-17 ENCOUNTER — Other Ambulatory Visit: Payer: Self-pay | Admitting: Family Medicine

## 2018-06-17 NOTE — Telephone Encounter (Signed)
I do not see a lidocaine cream on her medication list. Please call and clarify how often she is using, where she is using and tube size.

## 2018-06-17 NOTE — Telephone Encounter (Signed)
Please call patient and let her know that lidocaine patches denied. She can purchase the 4% lidocaine over the counter patches.

## 2018-06-17 NOTE — Telephone Encounter (Signed)
Patient would like the 5% lidocaine cream that she has been using.

## 2018-06-20 NOTE — Telephone Encounter (Signed)
Historical medication  Last filled 04/01/18 # 30 no refills

## 2018-06-20 NOTE — Telephone Encounter (Signed)
Called and left detailed message informing patient of request. Instructing patient to return call to office.

## 2018-06-22 ENCOUNTER — Encounter: Payer: Self-pay | Admitting: Family Medicine

## 2018-06-24 ENCOUNTER — Telehealth: Payer: Self-pay | Admitting: Family Medicine

## 2018-06-24 ENCOUNTER — Telehealth: Payer: Self-pay

## 2018-06-24 MED ORDER — CARBAMAZEPINE 200 MG PO TABS: ORAL_TABLET | ORAL | 2 refills | Status: DC

## 2018-06-24 NOTE — Telephone Encounter (Signed)
This is duplicate phone note due to CRM not resolving; see phone note 06/24/18.

## 2018-06-24 NOTE — Telephone Encounter (Signed)
Copied from CRM #[id number]. Topic: Quick Communication - Rx Refill/Question >> Jun 24, 2018  8:49 AM Gerrianne ScalePayne, Katelyn Galloway wrote: Medication: carbamazepine (TEGRETOL) [id number] MG tablet  Patient cant afford to come into office to be seen  Has the patient contacted their pharmacy? Yes.  Pharmacy called for pt (Agent: If no, request that the patient contact the pharmacy for the refill.) (Agent: If yes, when and what did the pharmacy advise?)  Preferred Pharmacy (with phone number or street name):   [facility] [place] - [address] [phone] (Phone) [phone] (Fax)      Agent: Please be advised that RX refills may take up to 3 business days. We ask that you follow-up with your pharmacy.

## 2018-06-24 NOTE — Addendum Note (Signed)
Addended by: Patience MuscaISLEY, RENA M on: 06/24/2018 11:11 AM   Modules accepted: Orders

## 2018-06-24 NOTE — Telephone Encounter (Signed)
Copied from CRM [phone]. Topic: Quick Communication - Rx Refill/Question >> Jun 24, 2018  8:47 AM Gerrianne ScalePayne, Angela L wrote: Medication: carbamazepine (TEGRETOL) 200 MG tablet  Has the patient contacted their pharmacy? Yes.  Pharmacy calling for pt (Agent: If no, request that the patient contact the pharmacy for the refill.) (Agent: If yes, when and what did the pharmacy advise?)  Preferred Pharmacy (with phone number or street name):   Agent: Please be advised that RX refills may take up to 3 business days. We ask that you follow-up with your pharmacy. >> Jun 24, 2018  8:48 AM Gloriann LoanPayne, Angela L wrote: viod error computer cut off on me

## 2018-06-24 NOTE — Telephone Encounter (Signed)
Attempted to call patient to let her know that the prescription should be at her pharmacy. And she needs to call them. No answer, left message for patient.

## 2018-06-24 NOTE — Telephone Encounter (Signed)
I spoke with [place] who transferred me to [facility]; pharmacist did not get carbamazepine rx on 06/20/18; order given verbally and pharmacist voiced understanding.

## 2018-07-11 ENCOUNTER — Encounter: Payer: Self-pay | Admitting: Family Medicine

## 2018-07-11 ENCOUNTER — Telehealth: Payer: Self-pay

## 2018-07-11 NOTE — Telephone Encounter (Signed)
Pt last seen 05/18/18 for f/u. Pt has appt scheduled 07/15/18 for f/u.Please advise.

## 2018-07-11 NOTE — Telephone Encounter (Signed)
Copied from CRM [id number]. Topic: Quick Communication - See Telephone Encounter >> Jul 11, 2018  2:49 PM Jay SchlichterWeikart, Melissa J wrote: CRM for notification. See Telephone encounter for: 07/11/18. Pt was sent home from discharge with rx for lidocaine (LIDODERM) 5 %. She would like to switch this to gel.  Pharm says this change has to come from pcp since it was prescribed at discharge.   [facility] [phone]

## 2018-07-11 NOTE — Telephone Encounter (Signed)
I don't understand where she was discharged from? I don't see anything in Epic, can you please call and find out if she had a recent admission?

## 2018-07-13 ENCOUNTER — Telehealth: Payer: Self-pay

## 2018-07-13 ENCOUNTER — Other Ambulatory Visit: Payer: Self-pay | Admitting: Family Medicine

## 2018-07-13 DIAGNOSIS — G8929 Other chronic pain: Secondary | ICD-10-CM

## 2018-07-13 DIAGNOSIS — M5442 Lumbago with sciatica, left side: Secondary | ICD-10-CM

## 2018-07-13 DIAGNOSIS — M797 Fibromyalgia: Secondary | ICD-10-CM

## 2018-07-13 DIAGNOSIS — M5441 Lumbago with sciatica, right side: Secondary | ICD-10-CM

## 2018-07-13 MED ORDER — LIDOCAINE HCL 4 % EX GEL: 1.0000 "application " | Freq: Three times a day (TID) | CUTANEOUS | 3 refills | Status: DC | PRN

## 2018-07-13 NOTE — Progress Notes (Signed)
Called and spoke to pharmacist. 5% not available, 4% ordered.

## 2018-07-13 NOTE — Telephone Encounter (Signed)
Called and spoke with pharmacist. 5% not available, ordered 4%.

## 2018-07-13 NOTE — Telephone Encounter (Signed)
Copied from CRM [phone]. Topic: Quick Communication - See Telephone Encounter >> Jul 11, 2018  2:49 PM Jay SchlichterWeikart, Melissa J wrote: CRM for notification. See Telephone encounter for: 07/11/18. Pt was sent home from discharge with rx for lidocaine (LIDODERM) 5 %. She would like to switch this to gel.  Pharm says this change has to come from pcp since it was prescribed at discharge.   [facility] [phone]  >> Jul 13, 2018  2:31 PM Gerrianne ScalePayne, Angela L wrote: [facility] [place] [facility] [place] [address] [phone] (Phone) [phone] (Fax)     Calling wanting to know if Katelyn SprangDebbie Galloway would prescribe the lidocaine (LIDODERM) 5 % she states that Armandina StammerAgnes Nwoko from behavioral health had wrote this RX for pt when she was being discarded  And they want change it from the patch to the gel please give them a call back at [phone]

## 2018-07-14 NOTE — Telephone Encounter (Signed)
° ° °  Pharmacy call back to say pt does not want the patch she wants the jell. Pharmacy would like to know it pt can use the 2% jell for the lidocaine

## 2018-07-14 NOTE — Telephone Encounter (Signed)
Please call patient and let her know that I spoke to the pharmacist and they have [place] 4% gel that is over the counter that she can purchase.

## 2018-07-14 NOTE — Telephone Encounter (Signed)
Rosanne AshingJim w/[facility] [place] [phone] (ask for the doctor's call area}.  Rosanne AshingJim stated that they don't have the 4% in a gel, but they do have the patch.  Please advise if the provider would change to that.

## 2018-07-15 ENCOUNTER — Encounter: Payer: Self-pay | Admitting: Family Medicine

## 2018-07-15 ENCOUNTER — Ambulatory Visit (INDEPENDENT_AMBULATORY_CARE_PROVIDER_SITE_OTHER): Payer: Medicare Other | Admitting: Family Medicine

## 2018-07-15 VITALS — BP 114/70 | HR 89 | Temp 98.5°F | Ht 64.0 in | Wt 150.0 lb

## 2018-07-15 DIAGNOSIS — F411 Generalized anxiety disorder: Secondary | ICD-10-CM

## 2018-07-15 DIAGNOSIS — M797 Fibromyalgia: Secondary | ICD-10-CM

## 2018-07-15 NOTE — Progress Notes (Signed)
Subjective:    Patient ID: Katelyn Galloway, female    DOB: 09/22/1961, 57 y.o.   MRN: [id number]  HPI This is a 57 yo female who presents today for follow up of chronic conditions. Her fibromyalgia has been pretty well controlled with current meds including amitriptyline, baclofen, topical lidocaine, lyrica.  She continues to live with her roommate/SO and his son. The son is scheduled to move out 09/04/18. This has been a source of stress. She made an appointment with therapist, but had to cancel because her friend died.   Past Medical History:  Diagnosis Date  . Anemia 1970  . Anxiety   . Arthritis   . Asthma    child  . Bipolar disorder (HCC)   . Bronchitis    "years ago"  . Bronchitis    chronic hx  . Chronic back pain    hx of  . Depression   . Fibromyalgia   . GERD (gastroesophageal reflux disease)    occ  . Headache(784.0)    migraines  . Heart palpitations    occ if get anxious- no tests  . History of kidney stones   . Hypothyroidism   . Irritable bowel syndrome (IBS)   . Seizures (HCC)    x 2 during pregnancy per pt was not diagnosed with eclampsia   Past Surgical History:  Procedure Laterality Date  . ABDOMINAL HYSTERECTOMY  1992  . BACK SURGERY     x2  . CESAREAN SECTION  1990  . CHOLECYSTECTOMY  2009 or 2010  . HARDWARE REMOVAL  12/29/2012   Procedure: HARDWARE REMOVAL;  Surgeon: Tia Alertavid S Jones, MD;  Location: [facility];  Service: Neurosurgery;  Laterality: N/A;  Lumbar hardware extraction  . kindey stone removal    . KNEE ARTHROSCOPY     right knee  . SPINAL CORD STIMULATOR INSERTION N/A 01/25/2015   Procedure: LUMBAR SPINAL CORD STIMULATOR INSERTION;  Surgeon: Gwynne EdingerPaul C Harkins, MD;  Location: [facility];  Service: Neurosurgery;  Laterality: N/A;  . TUBAL LIGATION     Family History  Problem Relation Age of Onset  . Depression Mother   . Heart attack Mother   . Suicidality Father   . Heart disease Father   . Alcohol abuse Father   . Irritable  bowel syndrome Father   . Anesthesia problems Neg Hx   . Colon cancer Neg Hx   . Esophageal cancer Neg Hx    Social History   Tobacco Use  . Smoking status: Current Some Day Smoker    Packs/day: 1.00    Years: 43.00    Pack years: 43.00    Types: Cigarettes  . Smokeless tobacco: Never Used  Substance Use Topics  . Alcohol use: No    Frequency: Never  . Drug use: Yes    Types: Marijuana      Review of Systems Per HPI    Objective:   Physical Exam Physical Exam  Constitutional: Oriented to person, place, and time. She appears well-developed and well-nourished.  HENT:  Head: Normocephalic and atraumatic.  Eyes: Conjunctivae are normal.  Neck: Normal range of motion. Neck supple.  Cardiovascular: Normal rate, regular rhythm and normal heart sounds.   Pulmonary/Chest: Effort normal and breath sounds normal.  Musculoskeletal: Normal range of motion.  Neurological: Alert and oriented to person, place, and time.  Skin: Skin is warm and dry.  Psychiatric: Normal mood and affect. Behavior is normal. Judgment and thought content normal.  Vitals reviewed.  BP 114/70 (BP Location: Right Arm, Patient Position: Sitting, Cuff Size: Normal)   Pulse 89   Temp 98.5 F (36.9 C) (Oral)   Ht 5\' 4"  (1.626 m)   Wt 150 lb (68 kg)   SpO2 98%   BMI 25.75 kg/m  Wt Readings from Last 3 Encounters:  07/15/18 150 lb (68 kg)  06/09/18 147 lb (66.7 kg)  05/18/18 145 lb (65.8 kg)       Assessment & Plan:  1. Fibromyalgia - currently doing well - encouraged her to increase her exercise, especially walking out doors daily  2. GAD (generalized anxiety disorder) - continue current meds, discussed stress relieving activities and encouraged her to increase exercise - follow up in 6 months   [name], FNP-BC  [facility], [facility]  07/15/2018 5:28 PM

## 2018-07-15 NOTE — Patient Instructions (Signed)
Good to see you today  Please follow up in 6 months   

## 2018-07-15 NOTE — Telephone Encounter (Signed)
Patient being seen in office today.

## 2018-07-15 NOTE — Telephone Encounter (Signed)
Spoke to pt and advised per DGessner 

## 2018-07-15 NOTE — Telephone Encounter (Signed)
Pharmacy calling back, checking status. Call back [phone]

## 2018-07-22 ENCOUNTER — Encounter: Payer: Self-pay | Admitting: Family Medicine

## 2018-07-22 ENCOUNTER — Telehealth: Payer: Self-pay | Admitting: *Deleted

## 2018-07-22 NOTE — Telephone Encounter (Signed)
Received fax from pharmacy stating they can't get the lidocaine 4% gel and wants to see if the 2% can be approved.

## 2018-07-25 ENCOUNTER — Other Ambulatory Visit: Payer: Self-pay | Admitting: Family Medicine

## 2018-07-25 MED ORDER — LIDOCAINE 2 % EX GEL: 1.0000 "application " | Freq: Every day | CUTANEOUS | 0 refills | Status: DC | PRN

## 2018-07-25 NOTE — Telephone Encounter (Signed)
OK to approve 2%.

## 2018-07-25 NOTE — Addendum Note (Signed)
Addended by: Damita LackLORING, DONNA S on: 07/25/2018 10:28 AM   Modules accepted: Orders

## 2018-07-25 NOTE — Telephone Encounter (Signed)
Rx faxed to [phone].

## 2018-07-26 NOTE — Telephone Encounter (Signed)
Trazodone last refilled 04/01/18 #30 refills 0  Effexor Last filled 04/01/18 # 90 refills 0   Last OV 05/18/18

## 2018-08-12 ENCOUNTER — Other Ambulatory Visit: Payer: Self-pay | Admitting: Family Medicine

## 2018-08-13 ENCOUNTER — Other Ambulatory Visit: Payer: Self-pay | Admitting: Family Medicine

## 2018-08-15 NOTE — Telephone Encounter (Signed)
Was discontinued at Discharge by Armandina StammerAgnes,Nwoko NP

## 2018-08-23 ENCOUNTER — Other Ambulatory Visit: Payer: Self-pay | Admitting: Family Medicine

## 2018-08-26 NOTE — Telephone Encounter (Signed)
Last OV 07/15/18 Tegretol Last filled 06/24/18 #30 refills 2  Lyrica is historical medication.

## 2018-09-09 ENCOUNTER — Other Ambulatory Visit: Payer: Self-pay | Admitting: Family Medicine

## 2018-09-09 DIAGNOSIS — G8929 Other chronic pain: Secondary | ICD-10-CM

## 2018-09-09 DIAGNOSIS — M797 Fibromyalgia: Secondary | ICD-10-CM

## 2018-09-09 DIAGNOSIS — M542 Cervicalgia: Secondary | ICD-10-CM

## 2018-09-12 ENCOUNTER — Other Ambulatory Visit: Payer: Self-pay | Admitting: Family Medicine

## 2018-09-12 DIAGNOSIS — M542 Cervicalgia: Secondary | ICD-10-CM

## 2018-09-12 DIAGNOSIS — G8929 Other chronic pain: Secondary | ICD-10-CM

## 2018-09-12 DIAGNOSIS — M797 Fibromyalgia: Secondary | ICD-10-CM

## 2018-09-13 NOTE — Telephone Encounter (Signed)
Historical med also has been Dc'd at discharge.

## 2018-09-17 ENCOUNTER — Encounter: Payer: Self-pay | Admitting: Family Medicine

## 2018-09-19 ENCOUNTER — Other Ambulatory Visit: Payer: Self-pay | Admitting: Emergency Medicine

## 2018-09-19 ENCOUNTER — Telehealth: Payer: Self-pay | Admitting: Emergency Medicine

## 2018-09-19 MED ORDER — BACLOFEN 10 MG PO TABS
ORAL_TABLET | ORAL | 0 refills | Status: DC
Start: 2018-09-19 — End: 2018-12-26

## 2018-09-19 NOTE — Telephone Encounter (Signed)
Resent to pharmacy.    Copied from CRM [id number]. Topic: Quick Communication - Rx Refill/Question >> Sep 14, 2018  1:01 PM Crist Infante wrote: Medication: baclofen (LIORESAL) 10 MG tablet The refill did not go through, transmission failed. Can you resend?

## 2018-09-22 ENCOUNTER — Encounter: Payer: Self-pay | Admitting: Family Medicine

## 2018-09-26 ENCOUNTER — Other Ambulatory Visit: Payer: Self-pay | Admitting: Family Medicine

## 2018-09-26 DIAGNOSIS — R7309 Other abnormal glucose: Secondary | ICD-10-CM

## 2018-09-26 DIAGNOSIS — N951 Menopausal and female climacteric states: Secondary | ICD-10-CM

## 2018-09-26 DIAGNOSIS — E039 Hypothyroidism, unspecified: Secondary | ICD-10-CM

## 2018-09-28 ENCOUNTER — Other Ambulatory Visit (INDEPENDENT_AMBULATORY_CARE_PROVIDER_SITE_OTHER): Payer: Medicare Other

## 2018-09-28 DIAGNOSIS — R7309 Other abnormal glucose: Secondary | ICD-10-CM

## 2018-09-28 DIAGNOSIS — N951 Menopausal and female climacteric states: Secondary | ICD-10-CM | POA: Diagnosis not present

## 2018-09-28 DIAGNOSIS — E039 Hypothyroidism, unspecified: Secondary | ICD-10-CM

## 2018-09-28 LAB — COMPREHENSIVE METABOLIC PANEL
ALK PHOS: 89 U/L (ref 39–117)
ALT: 17 U/L (ref 0–35)
AST: 20 U/L (ref 0–37)
Albumin: 4.2 g/dL (ref 3.5–5.2)
BUN: 18 mg/dL (ref 6–23)
CO2: 31 mEq/L (ref 19–32)
Calcium: 9.2 mg/dL (ref 8.4–10.5)
Chloride: 105 mEq/L (ref 96–112)
Creatinine, Ser: 0.82 mg/dL (ref 0.40–1.20)
GFR: 76.46 mL/min (ref >60.00)
GLUCOSE: 115 mg/dL — AB (ref 70–99)
POTASSIUM: 4 meq/L (ref 3.5–5.1)
Sodium: 140 mEq/L (ref 135–145)
TOTAL PROTEIN: 7 g/dL (ref 6.0–8.3)
Total Bilirubin: 0.2 mg/dL (ref 0.2–1.2)

## 2018-09-28 LAB — CBC WITH DIFFERENTIAL/PLATELET
BASOS PCT: 0.8 % (ref 0.0–3.0)
Basophils Absolute: 0 10*3/uL (ref 0.0–0.1)
Eosinophils Absolute: 0.1 10*3/uL (ref 0.0–0.7)
Eosinophils Relative: 2.2 % (ref 0.0–5.0)
HCT: 40.7 % (ref 36.0–46.0)
Hemoglobin: 13.8 g/dL (ref 12.0–15.0)
LYMPHS ABS: 1.6 10*3/uL (ref 0.7–4.0)
Lymphocytes Relative: 27.1 % (ref 12.0–46.0)
MCHC: 33.9 g/dL (ref 30.0–36.0)
MCV: 92.7 fl (ref 78.0–100.0)
MONOS PCT: 7.2 % (ref 3.0–12.0)
Monocytes Absolute: 0.4 10*3/uL (ref 0.1–1.0)
NEUTROS PCT: 62.7 % (ref 43.0–77.0)
Neutro Abs: 3.7 10*3/uL (ref 1.4–7.7)
Platelets: 194 10*3/uL (ref 150.0–400.0)
RBC: 4.39 Mil/uL (ref 3.87–5.11)
RDW: 14.3 % (ref 11.5–15.5)
WBC: 6 10*3/uL (ref 4.0–10.5)

## 2018-09-28 LAB — TSH: TSH: 3.28 u[IU]/mL (ref 0.35–4.50)

## 2018-09-28 LAB — HEMOGLOBIN A1C: Hgb A1c MFr Bld: 5.3 % (ref 4.6–6.5)

## 2018-09-28 LAB — FOLLICLE STIMULATING HORMONE: FSH: 76.4 m[IU]/mL

## 2018-10-02 IMAGING — US US TRANSVAGINAL NON-OB
1 series · 14 of 25 positions shown · non-contrast
Comparison: None

CLINICAL DATA: Right groin and pelvic pain. History of hysterectomy
and bilateral oophorectomy.

EXAM:
TRANSABDOMINAL AND TRANSVAGINAL ULTRASOUND OF PELVIS
TECHNIQUE: Both transabdominal and transvaginal ultrasound examinations of the
pelvis were performed. Transabdominal technique was performed for
global imaging of the pelvis. It was necessary to proceed with
endovaginal exam following the transabdominal exam to visualize the
adnexa.

[Series 1: us transvaginal non-ob · 63 acquisitions, 14 frames shown]
[im 1/63]
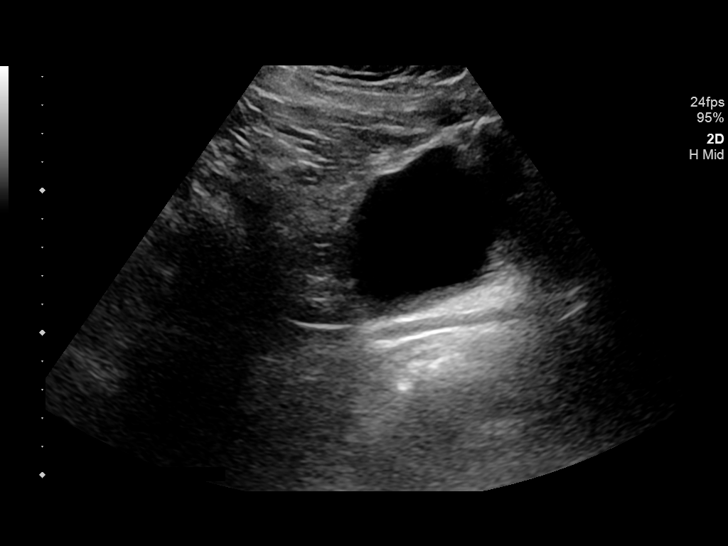
[im 6/63]
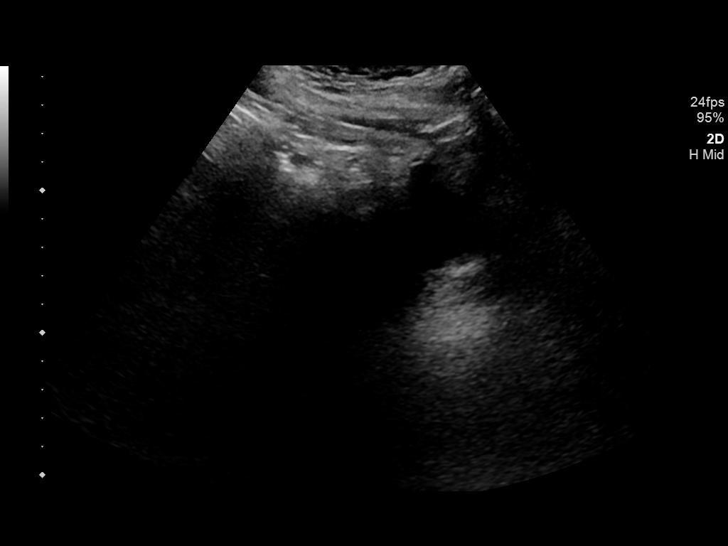
[im 11/63]
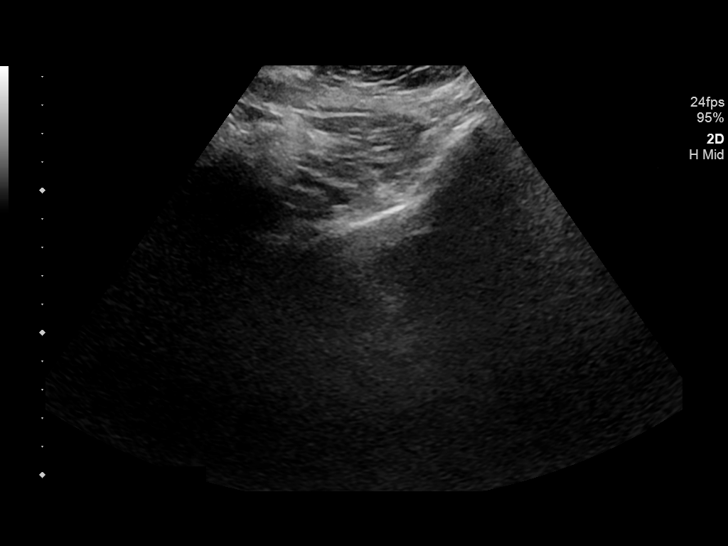
[im 16/63]
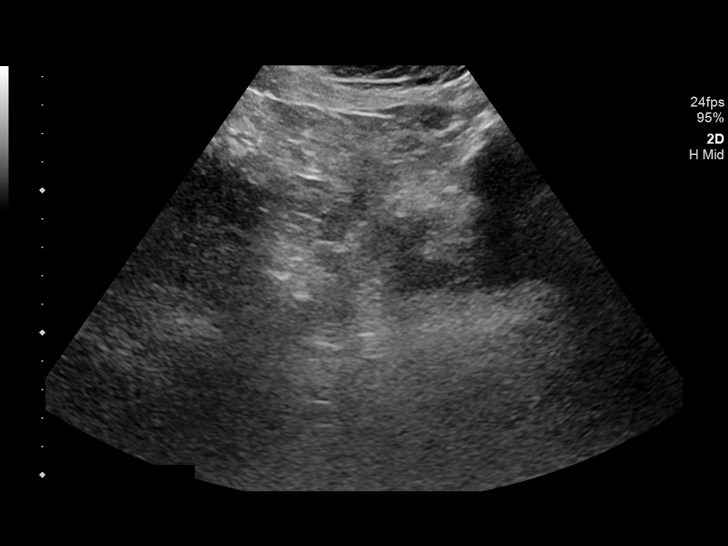
[im 21/63]
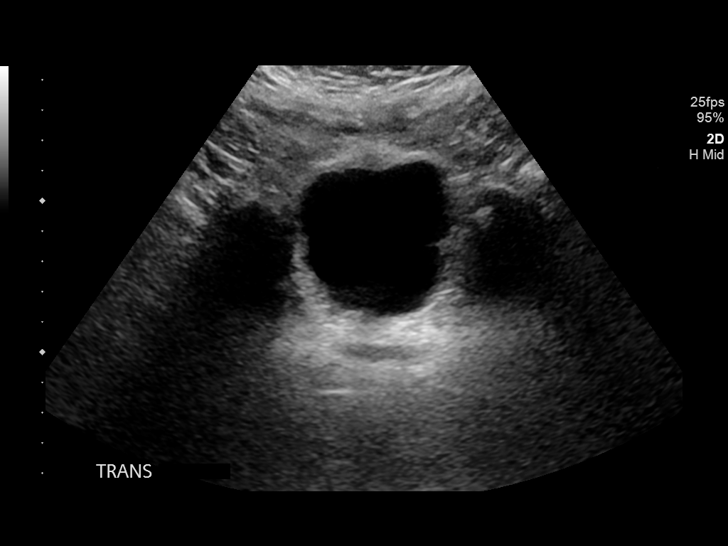
[im 24/63]
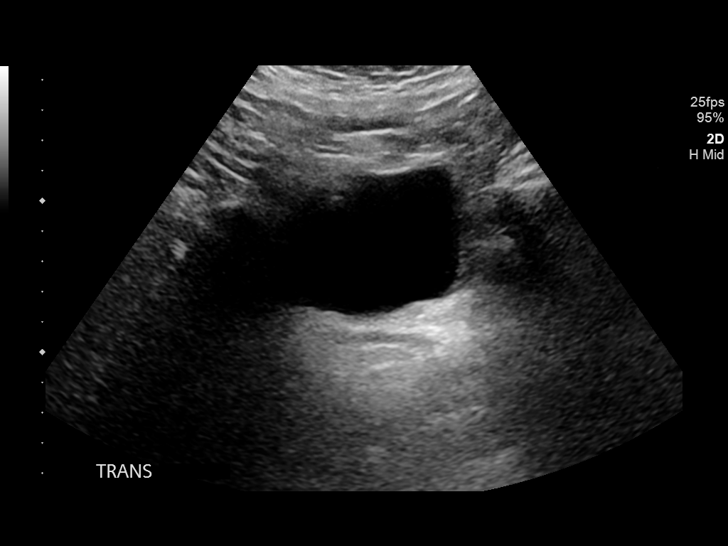
[im 29/63]
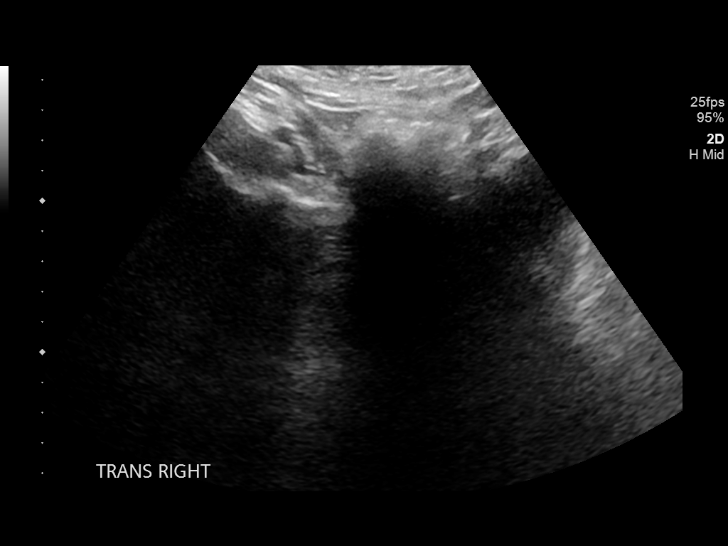
[im 34/63]
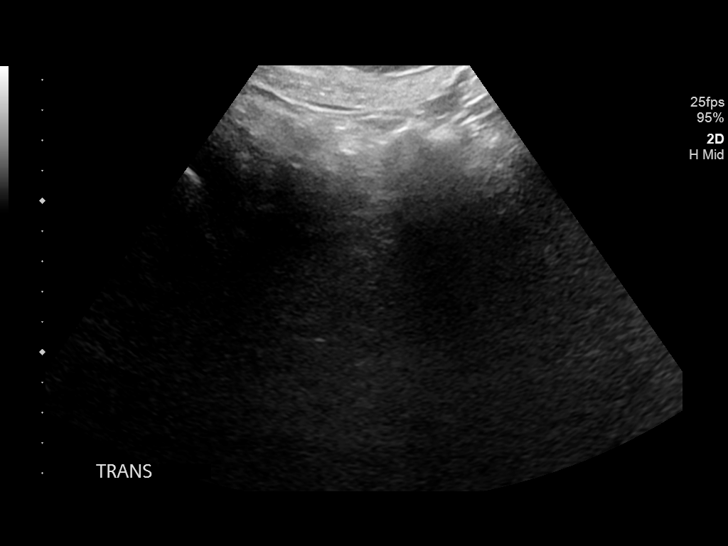
[im 39/63]
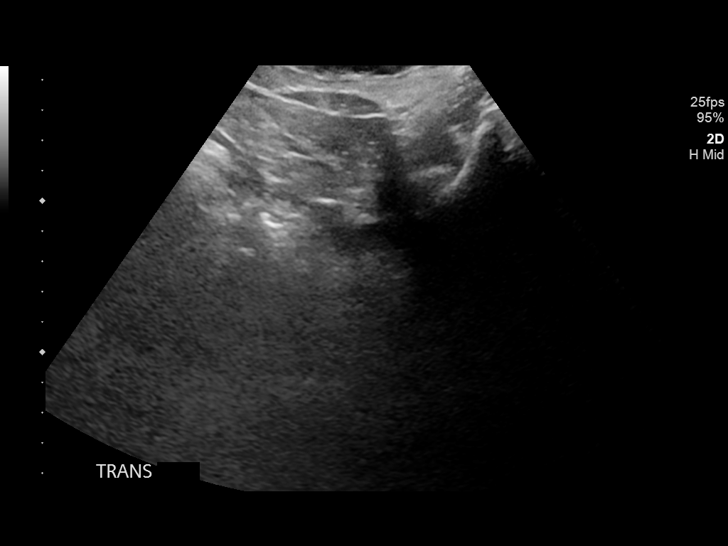
[im 42/63]
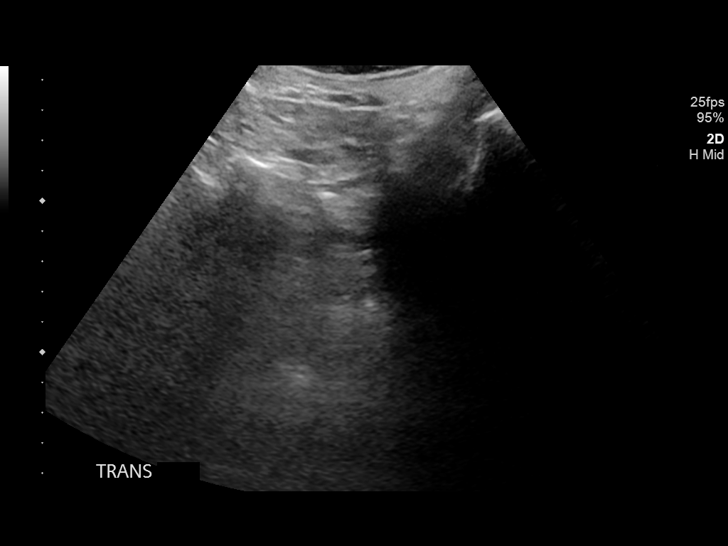
[im 47/63]
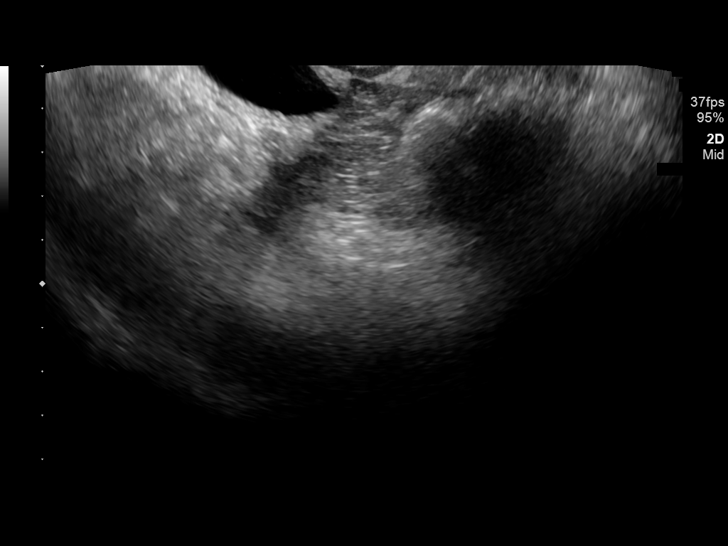
[im 52/63]
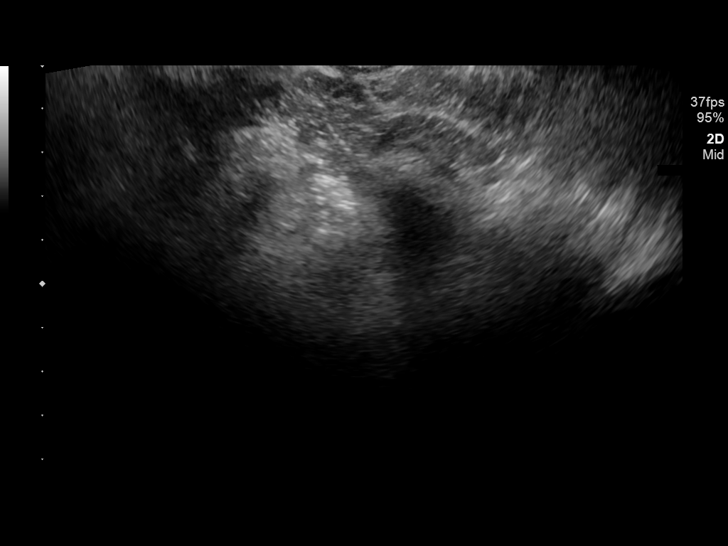
[im 57/63]
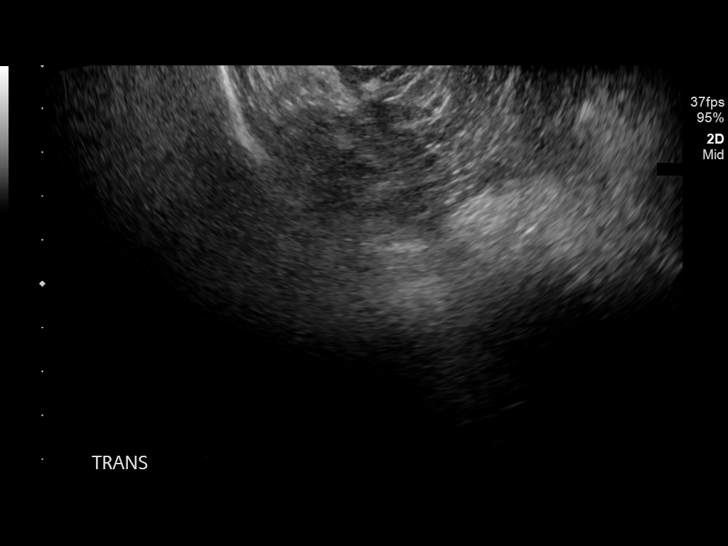
[im 63/63]
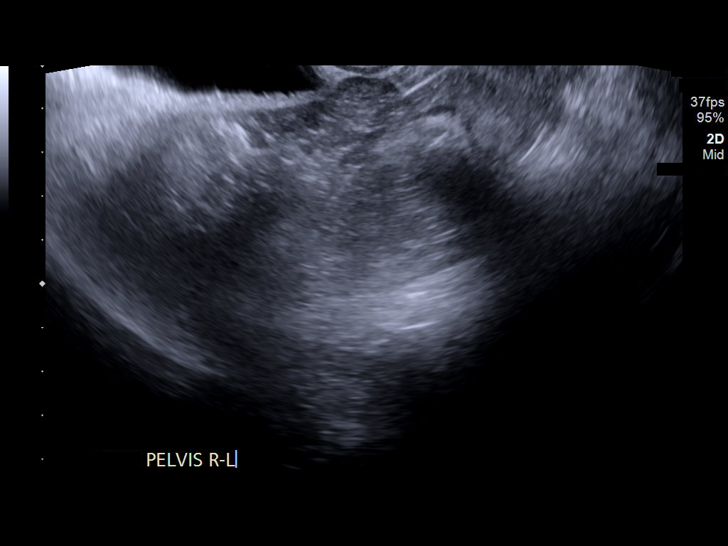

[14 of 25 positions shown; findings below may reference images not displayed]

FINDINGS: Uterus

Surgically absent.

Endometrium

Uterus surgically absent.

Right ovary

Ovary is surgically absent.  No adnexal mass.

Left ovary

Ovary is surgically absent.  No adnexal mass.

Other findings

No abnormal free fluid or focal fluid collection.
IMPRESSION: 1. Post hysterectomy and bilateral oophorectomy.
2. No evidence of adnexal mass. No sonographic findings to explain
groin/pelvic pain.

## 2018-10-04 ENCOUNTER — Encounter: Payer: Self-pay | Admitting: Family Medicine

## 2018-10-13 ENCOUNTER — Encounter: Payer: Self-pay | Admitting: Family Medicine

## 2018-10-13 ENCOUNTER — Ambulatory Visit: Payer: Self-pay

## 2018-10-13 ENCOUNTER — Ambulatory Visit: Payer: Medicare Other

## 2018-10-28 ENCOUNTER — Other Ambulatory Visit: Payer: Self-pay | Admitting: Emergency Medicine

## 2018-10-28 DIAGNOSIS — G8929 Other chronic pain: Secondary | ICD-10-CM

## 2018-10-28 DIAGNOSIS — M797 Fibromyalgia: Secondary | ICD-10-CM

## 2018-10-28 DIAGNOSIS — M542 Cervicalgia: Secondary | ICD-10-CM

## 2018-10-28 MED ORDER — MELOXICAM 7.5 MG PO TABS: ORAL_TABLET | ORAL | 1 refills | Status: DC

## 2018-11-07 ENCOUNTER — Other Ambulatory Visit: Payer: Self-pay | Admitting: Family Medicine

## 2018-11-09 ENCOUNTER — Other Ambulatory Visit: Payer: Self-pay | Admitting: Family Medicine

## 2018-11-09 DIAGNOSIS — E039 Hypothyroidism, unspecified: Secondary | ICD-10-CM

## 2018-11-09 NOTE — Telephone Encounter (Signed)
Historical med.  Okay to refill? Quantity?

## 2018-11-18 ENCOUNTER — Other Ambulatory Visit: Payer: Self-pay | Admitting: Family Medicine

## 2018-11-21 NOTE — Telephone Encounter (Signed)
Last filled 09/19/18 #180 refills 0  Last OV 07/15/18

## 2018-12-08 ENCOUNTER — Ambulatory Visit: Payer: Self-pay

## 2018-12-09 ENCOUNTER — Encounter: Payer: Self-pay | Admitting: Family Medicine

## 2018-12-09 ENCOUNTER — Ambulatory Visit (INDEPENDENT_AMBULATORY_CARE_PROVIDER_SITE_OTHER): Payer: Medicare Other | Admitting: Family Medicine

## 2018-12-09 VITALS — BP 118/70 | HR 100 | Temp 97.9°F | Ht 64.0 in | Wt 150.8 lb

## 2018-12-09 DIAGNOSIS — M5442 Lumbago with sciatica, left side: Secondary | ICD-10-CM | POA: Diagnosis not present

## 2018-12-09 DIAGNOSIS — M797 Fibromyalgia: Secondary | ICD-10-CM | POA: Diagnosis not present

## 2018-12-09 DIAGNOSIS — J4520 Mild intermittent asthma, uncomplicated: Secondary | ICD-10-CM | POA: Diagnosis not present

## 2018-12-09 DIAGNOSIS — M5441 Lumbago with sciatica, right side: Secondary | ICD-10-CM

## 2018-12-09 DIAGNOSIS — G8929 Other chronic pain: Secondary | ICD-10-CM

## 2018-12-09 NOTE — Progress Notes (Signed)
Subjective:    Patient ID: Katelyn Galloway, female    DOB: 12/17/1961, 57 y.o.   MRN: [id number]  HPI This is a 57 yo female who presents today for follow up of chronic medical conditons. Fibromyalgia- currently active with wood splitting and increased activity seems to help, but cold weather has been making pain worse. Has been taking meloxicam 7.5 mg tid recently with little improvement.   Anxiety/depression-  Katelyn Galloway and her SO [facility]. Katelyn Galloway is finding this helpful. Katelyn Galloway does not see herself ever leaving Katelyn MaduroRobert, so Katelyn Galloway is trying to find a way they can be together in a healthier way. His grown son still lives with them but Katelyn Galloway is hopeful he will move out in 2020. Katelyn Galloway has started going back to church recently which Katelyn Galloway feels is helping her be more hopeful as well. Katelyn MaduroRobert has had some increased [place] with the [employer] and they are not so stressed about their financial situation.   Recurrent bronchitis- had flu shot earlier this fall. No wheezing or productive cough.   Past Medical History:  Diagnosis Date  . Anemia 1970  . Anxiety   . Arthritis   . Asthma    child  . Bipolar disorder (HCC)   . Bronchitis    "years ago"  . Bronchitis    chronic hx  . Chronic back pain    hx of  . Depression   . Fibromyalgia   . GERD (gastroesophageal reflux disease)    occ  . Headache(784.0)    migraines  . Heart palpitations    occ if get anxious- no tests  . History of kidney stones   . Hypothyroidism   . Irritable bowel syndrome (IBS)   . Seizures (HCC)    x 2 during pregnancy per pt was not diagnosed with eclampsia   Past Surgical History:  Procedure Laterality Date  . ABDOMINAL HYSTERECTOMY  1992  . BACK SURGERY     x2  . CESAREAN SECTION  1990  . CHOLECYSTECTOMY  2009 or 2010  . HARDWARE REMOVAL  12/29/2012   Procedure: HARDWARE REMOVAL;  Surgeon: Tia Alertavid S Jones, MD;  Location: [facility];  Service: Neurosurgery;  Laterality: N/A;  Lumbar hardware  extraction  . kindey stone removal    . KNEE ARTHROSCOPY     right knee  . SPINAL CORD STIMULATOR INSERTION N/A 01/25/2015   Procedure: LUMBAR SPINAL CORD STIMULATOR INSERTION;  Surgeon: Gwynne EdingerPaul C Harkins, MD;  Location: [facility];  Service: Neurosurgery;  Laterality: N/A;  . TUBAL LIGATION     Family History  Problem Relation Age of Onset  . Depression Mother   . Heart attack Mother   . Suicidality Father   . Heart disease Father   . Alcohol abuse Father   . Irritable bowel syndrome Father   . Anesthesia problems Neg Hx   . Colon cancer Neg Hx   . Esophageal cancer Neg Hx    Social History   Tobacco Use  . Smoking status: Current Some Day Smoker    Packs/day: 1.00    Years: 43.00    Pack years: 43.00    Types: Cigarettes  . Smokeless tobacco: Never Used  Substance Use Topics  . Alcohol use: No    Frequency: Never  . Drug use: Yes    Types: Marijuana    Review of Systems     Objective:   Physical Exam Physical Exam  Constitutional: Oriented to person, place,  and time. Katelyn Galloway appears well-developed and well-nourished.  HENT:  Head: Normocephalic and atraumatic.  Eyes: Conjunctivae are normal.  Neck: Normal range of motion. Neck supple.  Cardiovascular: Normal rate, regular rhythm and normal heart sounds.   Pulmonary/Chest: Effort normal and breath sounds normal.  Musculoskeletal: No edema.  Neurological: Alert and oriented to person, place, and time.  Skin: Skin is warm and dry.  Psychiatric: Normal mood and affect. Behavior is normal. Judgment and thought content normal.  Vitals reviewed.     BP 118/70 (BP Location: Left Arm, Patient Position: Sitting, Cuff Size: Normal)   Pulse 100   Temp 97.9 F (36.6 C) (Oral)   Ht 5\' 4"  (1.626 m)   Wt 150 lb 12 oz (68.4 kg)   SpO2 98%   BMI 25.88 kg/m  Wt Readings from Last 3 Encounters:  12/09/18 150 lb 12 oz (68.4 kg)  07/15/18 150 lb (68 kg)  06/09/18 147 lb (66.7 kg)   Depression screen [facility] 2/9 12/09/2018  10/18/2017  Decreased Interest 0 0  Down, Depressed, Hopeless 0 0  PHQ - 2 Score 0 0       Assessment & Plan:  1. Chronic bilateral low back pain with bilateral sciatica - discussed dosing of meloxicam and Katelyn Galloway is to not exceed 15 mg daily - meloxicam (MOBIC) 15 MG tablet; Take 1 tablet (15 mg total) by mouth daily.  Dispense: 90 tablet; Refill: 1  2. Mild intermittent asthma without complication - currently doing well, rare albuterol use  3. Fibromyalgia - encouraged her to continue activity as tolerated  - follow up in 6 months   Olean Reeeborah Gessner, FNP-BC  [facility], [facility]  12/10/2018 7:26 AM

## 2018-12-09 NOTE — Patient Instructions (Signed)
Good to see you today  Please follow up in 6 months   

## 2018-12-10 ENCOUNTER — Encounter: Payer: Self-pay | Admitting: Family Medicine

## 2018-12-10 DIAGNOSIS — M797 Fibromyalgia: Secondary | ICD-10-CM | POA: Insufficient documentation

## 2018-12-10 MED ORDER — MELOXICAM 15 MG PO TABS: 15.0000 mg | ORAL_TABLET | Freq: Every day | ORAL | 1 refills | Status: DC

## 2018-12-13 IMAGING — CT CT CHEST LUNG CANCER SCREENING LOW DOSE W/O CM
2 of 5 series · 15 of 40 positions shown, 18 images · non-contrast
Comparison: None.

CLINICAL DATA: 57-year-old female with 43 pack-year history of
smoking. Lung cancer screening.

EXAM:
CT CHEST WITHOUT CONTRAST LOW-DOSE FOR LUNG CANCER SCREENING
TECHNIQUE: Multidetector CT imaging of the chest was performed following the
standard protocol without IV contrast.

[Series 3: lung · axial · 0.63mm/px · z∈[-1222,-922]mm · 12 of 332 slices shown, 15 images (1 of 2)]
[im 16/332  mediastinal]
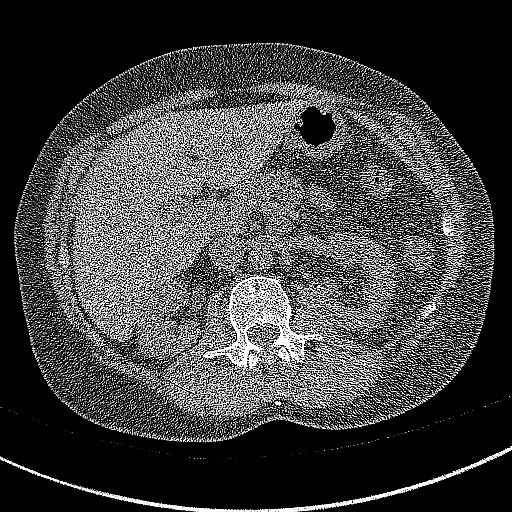
[im 16/332  lung]
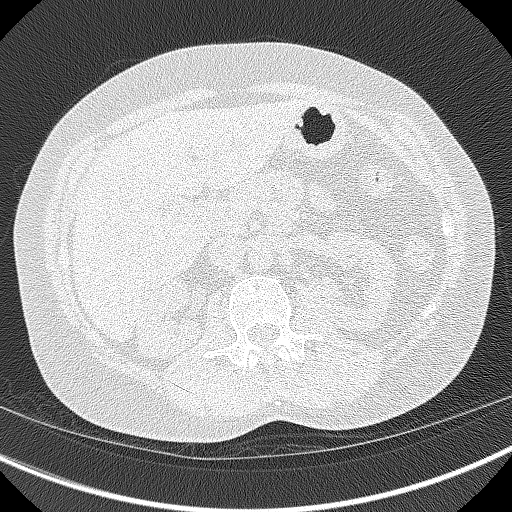
[im 46/332  lung]
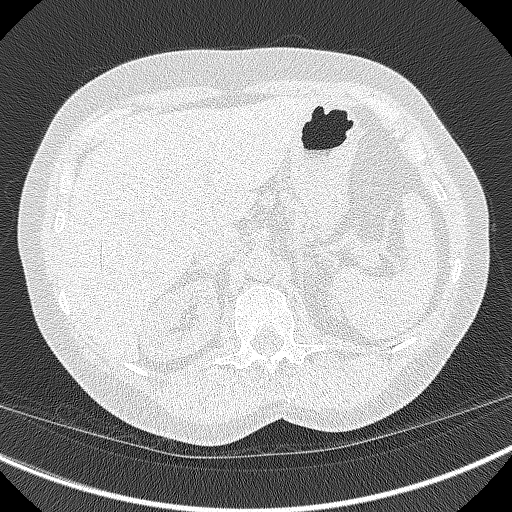
[im 76/332  lung]
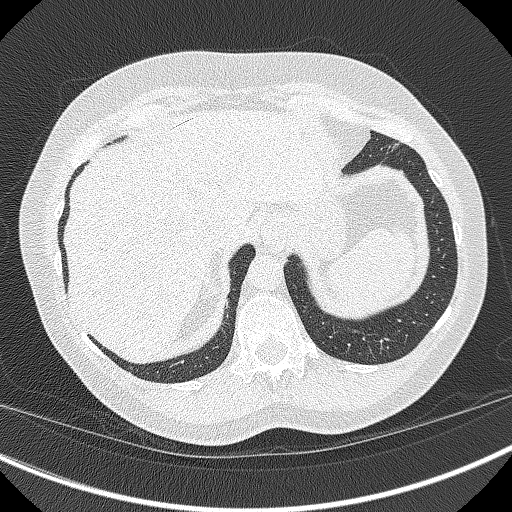
[im 106/332  lung]
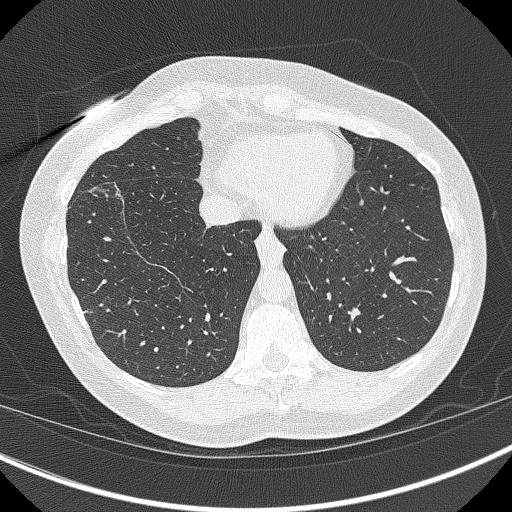
[im 121/332  mediastinal]
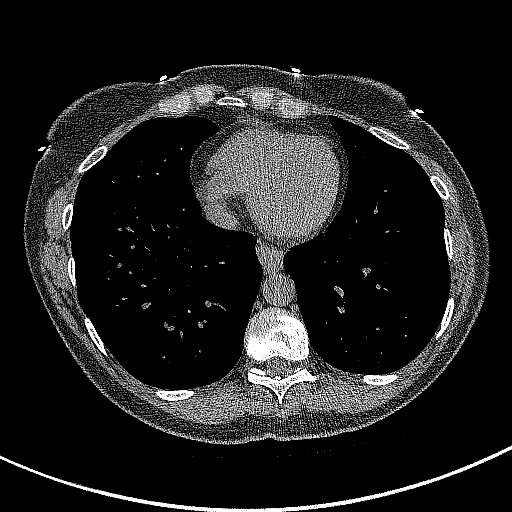
[im 121/332  lung]
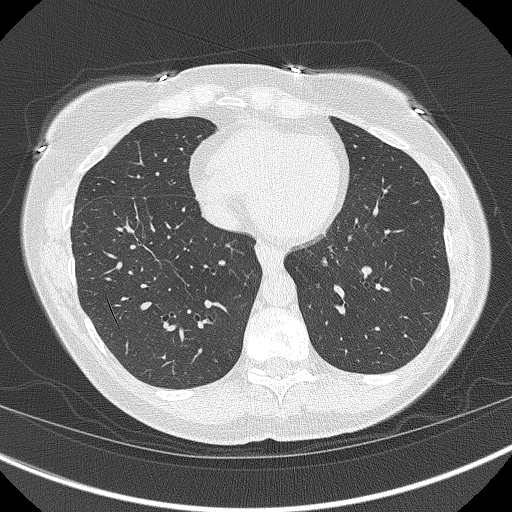
[im 151/332  lung]
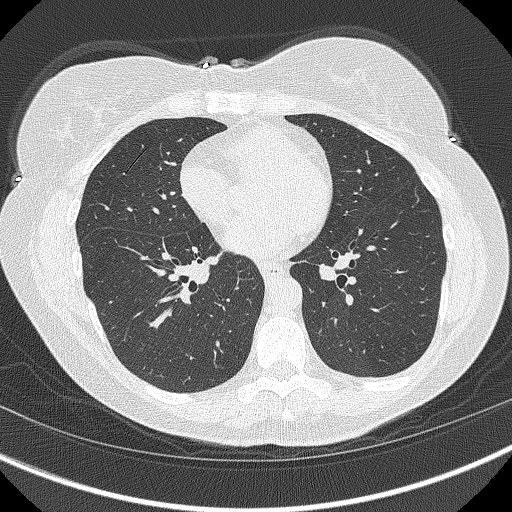
[im 181/332  lung]
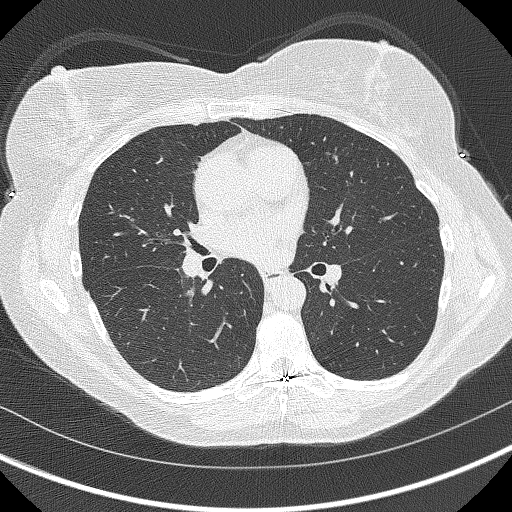
[im 211/332  lung]
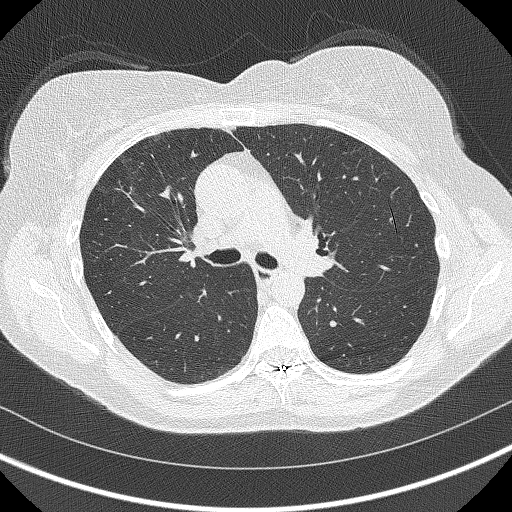
[im 226/332  mediastinal]
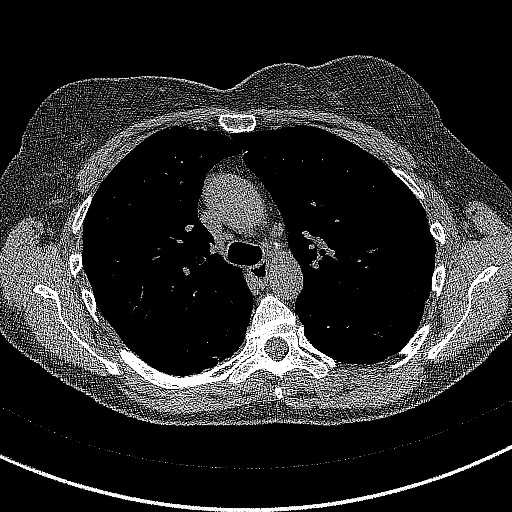
[im 226/332  lung]
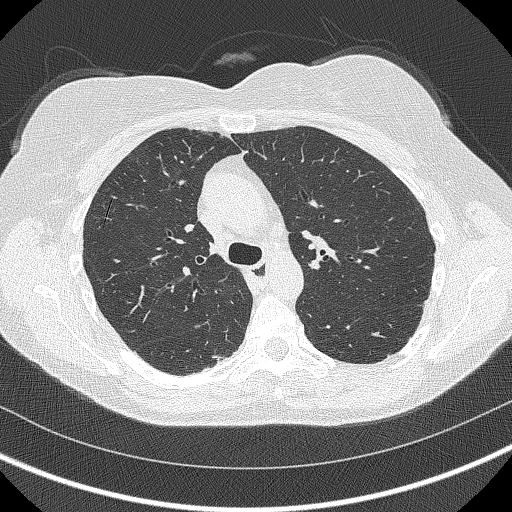
[im 256/332  lung]
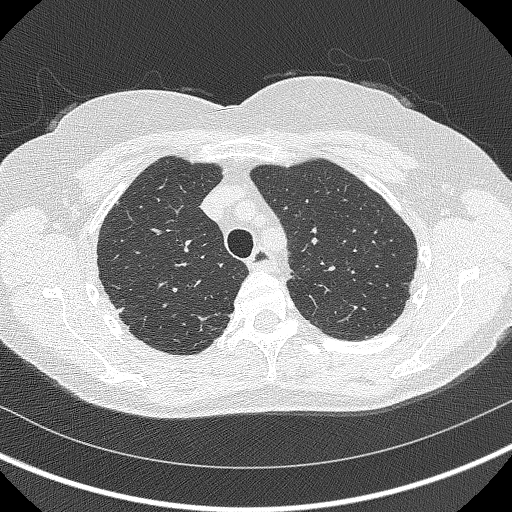
[im 286/332  lung]
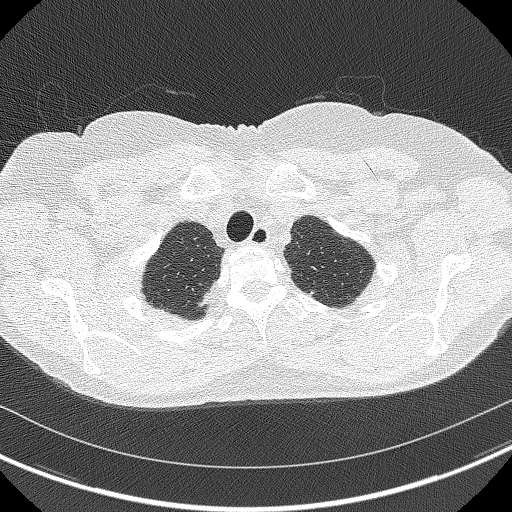
[im 316/332  lung]
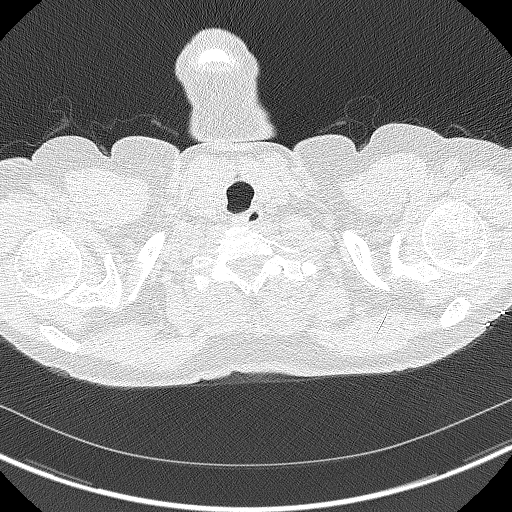

[Series 4: lung · coronal · 0.63mm/px · 3 of 271 slices shown (2 of 2)]
[im 55/271  lung]
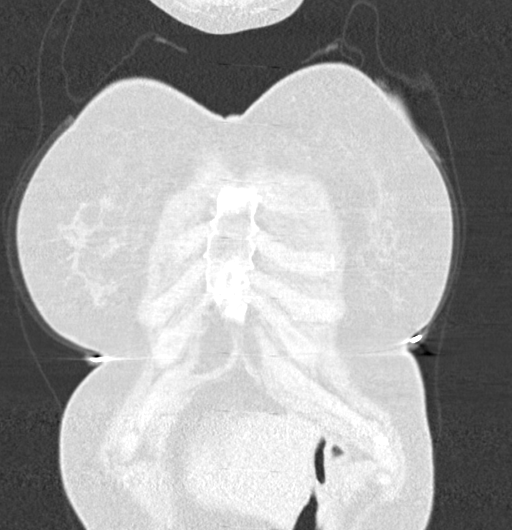
[im 109/271  lung]
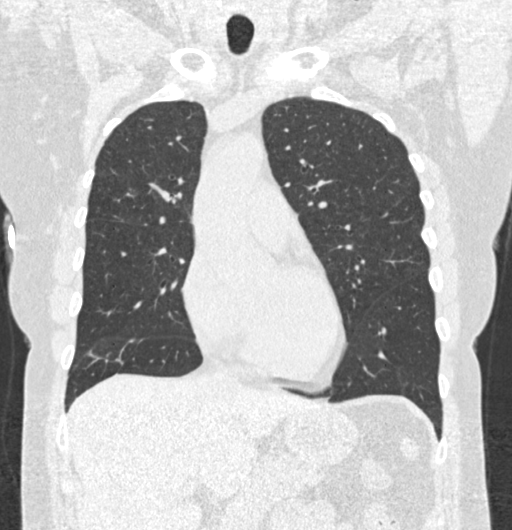
[im 163/271  lung]
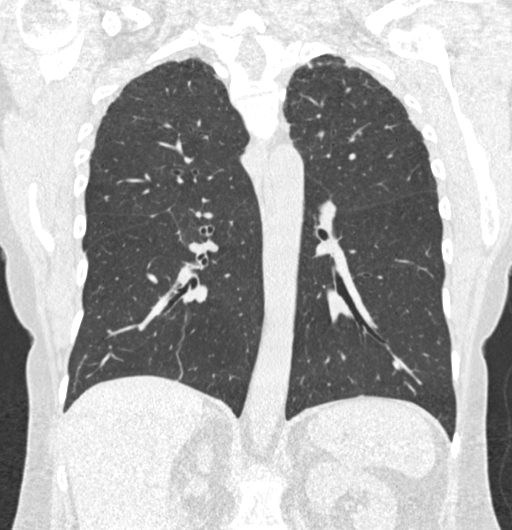

[15 of 40 positions shown; findings below may reference images not displayed]

FINDINGS: Cardiovascular: The heart size is normal. No substantial pericardial
effusion. No thoracic aortic aneurysm.

Mediastinum/Nodes: No mediastinal lymphadenopathy. No evidence for
gross hilar lymphadenopathy although assessment is limited by the
lack of intravenous contrast on today's study. The esophagus has
normal imaging features. There is no axillary lymphadenopathy.

Lungs/Pleura: The central tracheobronchial airways are patent.
Symmetric biapical pleuroparenchymal scarring noted. Scattered tiny
bilateral pulmonary nodules are identified. Areas of small airway
impaction noted. No pulmonary edema or pleural effusion.

Upper Abdomen: Unremarkable.

Musculoskeletal: Thoracic spinal stimulator device evident. No
worrisome lytic or sclerotic osseous abnormality.
IMPRESSION: 1. Lung-RADS 2, benign appearance or behavior. Continue annual
screening with low-dose chest CT without contrast in 12 months.

## 2018-12-19 ENCOUNTER — Encounter: Payer: Self-pay | Admitting: Family Medicine

## 2018-12-20 ENCOUNTER — Encounter: Payer: Self-pay | Admitting: Family Medicine

## 2018-12-26 ENCOUNTER — Encounter: Payer: Self-pay | Admitting: Family Medicine

## 2018-12-26 MED ORDER — BACLOFEN 10 MG PO TABS: ORAL_TABLET | ORAL | 1 refills | Status: DC

## 2018-12-26 NOTE — Telephone Encounter (Signed)
Last office visit 12/09/2018.  Last refilled 09/19/2018 for #180 with no refills.  No future appointments.  Ok to refill?

## 2018-12-29 DIAGNOSIS — F319 Bipolar disorder, unspecified: Secondary | ICD-10-CM | POA: Diagnosis not present

## 2018-12-29 DIAGNOSIS — F603 Borderline personality disorder: Secondary | ICD-10-CM | POA: Diagnosis not present

## 2019-01-03 DIAGNOSIS — M797 Fibromyalgia: Secondary | ICD-10-CM | POA: Diagnosis not present

## 2019-01-03 DIAGNOSIS — M961 Postlaminectomy syndrome, not elsewhere classified: Secondary | ICD-10-CM | POA: Diagnosis not present

## 2019-01-20 ENCOUNTER — Encounter: Payer: Self-pay | Admitting: Family Medicine

## 2019-01-20 ENCOUNTER — Other Ambulatory Visit: Payer: Self-pay | Admitting: Family Medicine

## 2019-01-30 DIAGNOSIS — F319 Bipolar disorder, unspecified: Secondary | ICD-10-CM | POA: Diagnosis not present

## 2019-01-30 DIAGNOSIS — F603 Borderline personality disorder: Secondary | ICD-10-CM | POA: Diagnosis not present

## 2019-02-13 DIAGNOSIS — F603 Borderline personality disorder: Secondary | ICD-10-CM | POA: Diagnosis not present

## 2019-02-13 DIAGNOSIS — F319 Bipolar disorder, unspecified: Secondary | ICD-10-CM | POA: Diagnosis not present

## 2019-02-14 ENCOUNTER — Telehealth: Payer: Self-pay | Admitting: Neurology

## 2019-02-14 ENCOUNTER — Ambulatory Visit (INDEPENDENT_AMBULATORY_CARE_PROVIDER_SITE_OTHER): Payer: PPO | Admitting: Neurology

## 2019-02-14 ENCOUNTER — Encounter: Payer: Self-pay | Admitting: Neurology

## 2019-02-14 VITALS — BP 125/75 | HR 93 | Ht 64.0 in | Wt 154.0 lb

## 2019-02-14 DIAGNOSIS — G441 Vascular headache, not elsewhere classified: Secondary | ICD-10-CM | POA: Diagnosis not present

## 2019-02-14 DIAGNOSIS — G43909 Migraine, unspecified, not intractable, without status migrainosus: Secondary | ICD-10-CM | POA: Insufficient documentation

## 2019-02-14 DIAGNOSIS — H539 Unspecified visual disturbance: Secondary | ICD-10-CM

## 2019-02-14 DIAGNOSIS — R519 Headache, unspecified: Secondary | ICD-10-CM

## 2019-02-14 DIAGNOSIS — H9190 Unspecified hearing loss, unspecified ear: Secondary | ICD-10-CM | POA: Diagnosis not present

## 2019-02-14 DIAGNOSIS — G43711 Chronic migraine without aura, intractable, with status migrainosus: Secondary | ICD-10-CM | POA: Diagnosis not present

## 2019-02-14 DIAGNOSIS — R51 Headache with orthostatic component, not elsewhere classified: Secondary | ICD-10-CM

## 2019-02-14 MED ORDER — GALCANEZUMAB-GNLM 120 MG/ML ~~LOC~~ SOAJ: 240.0000 mg | Freq: Once | SUBCUTANEOUS | 0 refills | Status: DC

## 2019-02-14 MED ORDER — GALCANEZUMAB-GNLM 120 MG/ML ~~LOC~~ SOAJ: 240.0000 mg | Freq: Once | SUBCUTANEOUS | 0 refills | Status: AC

## 2019-02-14 MED ORDER — GALCANEZUMAB-GNLM 120 MG/ML ~~LOC~~ SOAJ: 120.0000 mg | SUBCUTANEOUS | 11 refills | Status: DC

## 2019-02-14 NOTE — Patient Instructions (Signed)
Start Emgality F/u in 4 months  Galcanezumab injection What is this medicine? GALCANEZUMAB (gal ka NEZ ue mab) is used to prevent migraines and treat cluster headaches. This medicine may be used for other purposes; ask your health care provider or pharmacist if you have questions. COMMON BRAND NAME(S): Emgality What should I tell my health care provider before I take this medicine? They need to know if you have any of these conditions: -an unusual or allergic reaction to galcanezumab, other medicines, foods, dyes, or preservatives -pregnant or trying to get pregnant -breast-feeding How should I use this medicine? This medicine is for injection under the skin. You will be taught how to prepare and give this medicine. Use exactly as directed. Take your medicine at regular intervals. Do not take your medicine more often than directed. It is important that you put your used needles and syringes in a special sharps container. Do not put them in a trash can. If you do not have a sharps container, call your pharmacist or healthcare provider to get one. Talk to your pediatrician regarding the use of this medicine in children. Special care may be needed. Overdosage: If you think you have taken too much of this medicine contact a poison control center or emergency room at once. NOTE: This medicine is only for you. Do not share this medicine with others. What if I miss a dose? If you miss a dose, take it as soon as you can. If it is almost time for your next dose, take only that dose. Do not take double or extra doses. What may interact with this medicine? Interactions are not expected. This list may not describe all possible interactions. Give your health care provider a list of all the medicines, herbs, non-prescription drugs, or dietary supplements you use. Also tell them if you smoke, drink alcohol, or use illegal drugs. Some items may interact with your medicine. What should I watch for while using  this medicine? Tell your doctor or healthcare professional if your symptoms do not start to get better or if they get worse. What side effects may I notice from receiving this medicine? Side effects that you should report to your doctor or health care professional as soon as possible: -allergic reactions like skin rash, itching or hives, swelling of the face, lips, or tongue Side effects that usually do not require medical attention (report these to your doctor or health care professional if they continue or are bothersome): -pain, redness, or irritation at site where injected This list may not describe all possible side effects. Call your doctor for medical advice about side effects. You may report side effects to FDA at 1-800-FDA-1088. Where should I keep my medicine? Keep out of the reach of children. You will be instructed on how to store this medicine. Throw away any unused medicine after the expiration date on the label. NOTE: This sheet is a summary. It may not cover all possible information. If you have questions about this medicine, talk to your doctor, pharmacist, or health care provider.  2019 Elsevier/Gold Standard (2018-05-25 12:03:23)

## 2019-02-14 NOTE — Telephone Encounter (Signed)
health team order sent to GI. No auth they will reach out to the pt to schedule.  °

## 2019-02-14 NOTE — Progress Notes (Addendum)
[facility]    Provider:  Dr Lucia Gaskins Referring Provider: Odette Fraction MD Primary Care Provider:  Emi Belfast, FNP  CC:  headache  HPI:  Katelyn Galloway is a 58 y.o. female here as requested by Odette Fraction MD for headaches.  She has a past medical history of lumbar postlaminectomy syndrome managed by Dr. Ollen Bowl, fibromyalgia, bipolar disorder, was on long-term opioid medications but from note was able to ultimately eliminate this.  However she is on multiple medications including carbamazepine, Celexa, Lyrica, meloxicam for pain or mood.   She tried gabapentin and there is an allergy listed for this.  She was started on Topamax for her migraines and sent here for follow-up.  Carbamazepine Celexa and Lyrica are also medications that can be used in migraine prevention so currently she is on at least for migraine prevention medications. Also on Amitriptyline and effexor. Mother and daughter have migraines. Wakes with migraines, positional in quality, vision and hearing changes and right hearing loss.vertigo.   She has always had headaches, these are continuous 24x7, can be 10/10 in pain, she has brain zaps on the right side of the head, and in the forehead bilaterally, she as light sensitivity, she has nausea, they can be throbbing. Throbbing headache mostly on the right, going into a dark room helps. Noise bothers her. movement makes it worse. Ongoing for a year. Daily migraines at least 15 days a month the migraines are severe and moderately severe. Her head feels sore all the time. She is under high stress.No other focal neurologic deficits, associated symptoms, inciting events or modifiable factors.     Reviewed notes, labs and imaging from outside physicians, which showed:  Reviewed notes by Dr. Ollen Bowl.  She was seen there for Worker's Compensation for assistance managing her post laminectomy syndrome.  She might ultimately underwent implantation of a spinal cord  stimulator and she continues to use low dose medications to manage the rest of her pains.  She also has a diagnosis apparently of fibromyalgia along with bipolar disorder. She continues to have some degree of sciatic pain on her right side. There appears to be many life stressors from the notes.  She is a smoker and has been trying to quit smoking.  She has been on Neurontin and more recently lyrical for her fibromyalgia pain.  She also has midthoracic musculoskeletal pain.  She has been having migraine headaches on a more frequent basis, mostly frontotemporal left side.   tsh normal, cbc and cmp unremarkable  Review of Systems: Patient complains of symptoms per HPI as well as the following symptoms: Weight gain, fatigue, blurred vision, hearing loss, shortness of breath, cough, feeling cold, increased thirst, flushing, ringing in ears, trouble swallowing, aching muscles, memory loss, confusion, sleepiness, restless legs, difficulty swallowing, tremor, anxiety, disinterest in activities, racing thoughts. Pertinent negatives and positives per HPI. All others negative.   Social History   Socioeconomic History  . Marital status: Divorced    Spouse name: Not on file  . Number of children: 3  . Years of education: Not on file  . Highest education level: Some college, no degree  Occupational History  . Not on file  Social Needs  . Financial resource strain: Not on file  . Food insecurity:    Worry: Not on file    Inability: Not on file  . Transportation needs:    Medical: Not on file    Non-medical: Not on file  Tobacco Use  . Smoking status:  Current Some Day Smoker    Packs/day: 0.50    Years: 43.00    Pack years: 21.50    Types: Cigarettes  . Smokeless tobacco: Never Used  Substance and Sexual Activity  . Alcohol use: No    Frequency: Never  . Drug use: Yes    Frequency: 7.0 times per week    Types: Marijuana    Comment: about 0.5 joint daily  . Sexual activity: Not Currently     Birth control/protection: Surgical  Lifestyle  . Physical activity:    Days per week: Not on file    Minutes per session: Not on file  . Stress: Not on file  Relationships  . Social connections:    Talks on phone: Not on file    Gets together: Not on file    Attends religious service: Not on file    Active member of club or organization: Not on file    Attends meetings of clubs or organizations: Not on file    Relationship status: Not on file  . Intimate partner violence:    Fear of current or ex partner: Not on file    Emotionally abused: Not on file    Physically abused: Not on file    Forced sexual activity: Not on file  Other Topics Concern  . Not on file  Social History Narrative   Lives at home with Lamonte Sakai and his son   Caffeine: 2 cups daily     Family History  Problem Relation Age of Onset  . Depression Mother   . Heart attack Mother   . Suicidality Father   . Heart disease Father   . Alcohol abuse Father   . Irritable bowel syndrome Father   . Prostate cancer Father   . Arthritis Father   . COPD Father   . Emphysema Father   . Migraines Daughter   . Anesthesia problems Neg Hx   . Colon cancer Neg Hx   . Esophageal cancer Neg Hx     Past Medical History:  Diagnosis Date  . Anemia 1970  . Anxiety   . Arthritis   . Asthma    child  . Bipolar disorder (HCC)   . Bronchitis    "years ago"  . Bronchitis    chronic hx  . Chronic back pain    hx of  . Depression   . Fibromyalgia   . GERD (gastroesophageal reflux disease)    occ  . Headache(784.0)    migraines  . Heart palpitations    occ if get anxious- no tests  . History of kidney stones   . Hypothyroidism   . Irritable bowel syndrome (IBS)   . Migraine   . Seizures (HCC)    x 2 during pregnancy per pt was not diagnosed with eclampsia    Patient Active Problem List   Diagnosis Date Noted  . Chronic migraine without aura, with intractable migraine, so stated, with status migrainosus  02/14/2019  . Fibromyalgia 12/10/2018  . Bipolar II disorder, severe, depressed, with mixed features, in partial remission (HCC) 03/30/2018  . Bipolar disorder with current episode depressed (HCC) 05/05/2017  . Cluster b traits (borderline and histrionoc) 03/26/2017  . Asthma 03/24/2017  . Tobacco use disorder 03/24/2017  . Cannabis abuse 03/23/2017  . Hypothyroidism 05/15/2015  . GAD (generalized anxiety disorder) 07/10/2010  . Chronic bilateral low back pain 07/09/2009    Past Surgical History:  Procedure Laterality Date  . ABDOMINAL HYSTERECTOMY  1992  .  BACK SURGERY     x2  . CESAREAN SECTION  1990  . CHOLECYSTECTOMY  2009 or 2010  . HARDWARE REMOVAL  12/29/2012   Procedure: HARDWARE REMOVAL;  Surgeon: Tia Alert, MD;  Location: [facility];  Service: Neurosurgery;  Laterality: N/A;  Lumbar hardware extraction  . kindey stone removal    . KNEE ARTHROSCOPY     right knee  . SPINAL CORD STIMULATOR INSERTION N/A 01/25/2015   Procedure: LUMBAR SPINAL CORD STIMULATOR INSERTION;  Surgeon: Gwynne Edinger, MD;  Location: [facility];  Service: Neurosurgery;  Laterality: N/A;  . TUBAL LIGATION      Current Outpatient Medications  Medication Sig Dispense Refill  . acetaminophen (TYLENOL) 500 MG tablet Take 1,000 mg by mouth daily at 3 pm. At 3 pm.    . albuterol (PROVENTIL HFA;VENTOLIN HFA) 108 (90 Base) MCG/ACT inhaler Inhale 2 puffs into the lungs every 4 (four) hours as needed for wheezing or shortness of breath.    Marland Kitchen amitriptyline (ELAVIL) 25 MG tablet Take one tablet (25 mg dose) by mouth at bedtime for sleep. 90 tablet 0  . Ascorbic Acid (VITAMIN C PO) Take 500 mg by mouth daily.    . Azelastine HCl 0.15 % SOLN Place 2 sprays into both nostrils daily.     . baclofen (LIORESAL) 10 MG tablet Take one tablet (10 mg total) by mouth every 12 (twelve) hours as needed for muscle spasms and back pain 180 tablet 1  . BIOTIN PO Take 500 mcg by mouth daily.    . carbamazepine (TEGRETOL)  200 MG tablet Take one tablet (200 mg dose) by mouth at bedtime for mood stabilization. 90 tablet 1  . cholecalciferol (VITAMIN D) 1000 units tablet Take 1 tablet (1,000 Units total) by mouth daily. For bone health    . CINNAMON PO Take 1,000 mg by mouth daily.    . Cyanocobalamin (VITAMIN B-12 PO) Take 500 mcg by mouth daily.    . diazepam (VALIUM) 10 MG tablet Take 10 mg by mouth. 1-2 times daily as needed.    . docusate sodium (COLACE) 100 MG capsule Take 1 capsule (100 mg total) by mouth daily. (May purchase from over the counter): For constipation 10 capsule 0  . levothyroxine (SYNTHROID, LEVOTHROID) 25 MCG tablet Take one tablet (25 mcg total) by mouth daily before breakfast. For low thyroid hormone. 90 tablet 1  . Lidocaine HCl 4 % GEL Apply 1 application topically 3 (three) times daily as needed. 1 Bottle 3  . MAGNESIUM SULFATE PO Take by mouth.    . meloxicam (MOBIC) 15 MG tablet Take 1 tablet (15 mg total) by mouth daily. 90 tablet 1  . Nutritional Supplements (ESTROVEN PO) Take 1 tablet by mouth daily.    Marland Kitchen omega-3 fish oil (MAXEPA) 1000 MG CAPS capsule Take 1 capsule by mouth daily.    Marland Kitchen omeprazole (PRILOSEC) 20 MG capsule Take 20 mg by mouth daily.    Marland Kitchen OVER THE COUNTER MEDICATION Take 1 tablet by mouth at bedtime. Legatrin PM: Acetaminophen 500 mg and Diphenhydramine 50 mg per tablet.    Marland Kitchen OVER THE COUNTER MEDICATION Take 5 mLs by mouth 2 (two) times daily. Life Tones Uric Acid Support    . OVER THE COUNTER MEDICATION Take by mouth as needed. Sore throat spray    . pregabalin (LYRICA) 150 MG capsule TAKE ONE CAPSULE(150 MG DOSE) BY MOUTH 3(THREE) TIMES A DAY. 90 capsule 2  . topiramate (TOPAMAX) 25 MG tablet  Take 25 mg by mouth 2 (two) times daily.    . traZODone (DESYREL) 50 MG tablet Take one tablet (50 mg dose) by mouth at bedtime as needed for sleep (Patient taking differently: Take 50 mg by mouth at bedtime. ) 30 tablet 5  . Turmeric Curcumin 500 MG CAPS Take 500 mg by mouth  daily.    Marland Kitchen venlafaxine XR (EFFEXOR-XR) 75 MG 24 hr capsule Take three capsules (225 mg dose) by mouth every morning with breakfast. 90 capsule 5  . VITAMIN A PO Take 3,000 mcg by mouth daily.    . Galcanezumab-gnlm (EMGALITY) 120 MG/ML SOAJ Inject 120 mg into the skin every 30 (thirty) days. 1 pen 11  . nicotine (NICODERM CQ - DOSED IN MG/24 HOURS) 21 mg/24hr patch Place 1 patch (21 mg total) onto the skin daily. (May buy from over the counter): For smoking cessation 28 patch 0   No current facility-administered medications for this visit.     Allergies as of 02/14/2019 - Review Complete 02/14/2019  Allergen Reaction Noted  . Cephalexin Other (See Comments) 02/12/2015  . Iodine Hives 01/03/2015  . Latex Rash 03/23/2017  . Tape Rash 01/03/2015    Vitals: BP 125/75 (BP Location: Right Arm, Patient Position: Sitting)   Pulse 93   Ht 5\' 4"  (1.626 m)   Wt 154 lb (69.9 kg)   BMI 26.43 kg/m  Last Weight:  Wt Readings from Last 1 Encounters:  02/14/19 154 lb (69.9 kg)   Last Height:   Ht Readings from Last 1 Encounters:  02/14/19 5\' 4"  (1.626 m)     Physical exam: Exam: Gen: NAD, conversant, well nourised, obese, well groomed                     CV: RRR, no MRG. No Carotid Bruits. No peripheral edema, warm, nontender Eyes: Conjunctivae clear without exudates or hemorrhage  Neuro: Detailed Neurologic Exam  Speech:    Speech is normal; fluent and spontaneous with normal comprehension.  Cognition:    The patient is oriented to person, place, and time;     recent and remote memory intact;     language fluent;     normal attention, concentration,     fund of knowledge Cranial Nerves:    The pupils are equal, round, and reactive to light. The fundi are normal and spontaneous venous pulsations are present. Visual fields are full to finger confrontation. Extraocular movements are intact. Trigeminal sensation is intact and the muscles of mastication are normal. The face is  symmetric. The palate elevates in the midline. Hearing intact. Voice is normal. Shoulder shrug is normal. The tongue has normal motion without fasciculations.   Coordination:    Normal finger to nose and heel to shin. Normal rapid alternating movements.   Gait:    Heel-toe and tandem gait are normal.   Motor Observation:    No asymmetry, no atrophy, and no involuntary movements noted. Tone:    Normal muscle tone.    Posture:    Posture is normal. normal erect    Strength:    Strength is V/V in the upper and lower limbs.      Sensation: intact to LT     Reflex Exam:  DTR's:    Decreased right AJ otherwise deep tendon reflexes in the upper and lower extremities are symmetrical bilaterally.   Toes:    The toes are equiv bilaterally.   Clonus:    Clonus is absent.  Assessment/Plan:  Migraines and possibly medication overuse.  I had a discussion with patient that her daily use of ibuprofen or Tylenol can cause medication overuse/rebound headache which is likely causing her chronic daily headaches. They only thing to do is to stop the medication unfortunately.  Migraines: Discussed migraine management including acute management and preventative management. Given her polypharmacy and failure of multiple medications suggest Emgality monthly injections.  CT head w/wo contrast (cannot have MRI) due to concerning symptoms of morning headaches, positional headaches,vision changes  to look for space occupying mass, chiari or intracranial hypertension (pseudotumor).  Orders Placed This Encounter  Procedures  . CT HEAD W & WO CONTRAST  . Basic Metabolic Panel  . Sedimentation rate  . C-reactive protein   Meds ordered this encounter  Medications  . Galcanezumab-gnlm (EMGALITY) 120 MG/ML SOAJ    Sig: Inject 120 mg into the skin every 30 (thirty) days.    Dispense:  1 pen    Refill:  11  . DISCONTD: Galcanezumab-gnlm (EMGALITY) 120 MG/ML SOAJ    Sig: Inject 240 mg into the skin  once for 1 dose.    Dispense:  2 pen    Refill:  0  . Galcanezumab-gnlm (EMGALITY) 120 MG/ML SOAJ    Sig: Inject 240 mg into the skin once for 1 dose.    Dispense:  2 pen    Refill:  0   Remember to drink plenty of fluid, eat healthy meals and do not skip any meals. Try to eat protein with a every meal and eat a healthy snack such as fruit or nuts in between meals. Try to keep a regular sleep-wake schedule and try to exercise daily, particularly in the form of walking, 20-30 minutes a day, if you can.   Discussed; To prevent or relieve headaches, try the following:  Cool Compress. Lie down and place a cool compress on your head.   Avoid headache triggers. If certain foods or odors seem to have triggered your migraines in the past, avoid them. A headache diary might help you identify triggers.   Include physical activity in your daily routine. Try a daily walk or other moderate aerobic exercise.   Manage stress. Find healthy ways to cope with the stressors, such as delegating tasks on your to-do list.   Practice relaxation techniques. Try deep breathing, yoga, massage and visualization.   Eat regularly. Eating regularly scheduled meals and maintaining a healthy diet might help prevent headaches. Also, drink plenty of fluids.   Follow a regular sleep schedule. Sleep deprivation might contribute to headaches  Consider biofeedback. With this mind-body technique, you learn to control certain bodily functions - such as muscle tension, heart rate and blood pressure - to prevent headaches or reduce headache pain.    Proceed to emergency room if you experience new or worsening symptoms or symptoms do not resolve, if you have new neurologic symptoms or if headache is severe, or for any concerning symptom.     MRI of the brain w.wo contrast Orders Placed This Encounter  Procedures  . CT HEAD W & WO CONTRAST  . Basic Metabolic Panel  . Sedimentation rate  . C-reactive protein   Meds  ordered this encounter  Medications  . Galcanezumab-gnlm (EMGALITY) 120 MG/ML SOAJ    Sig: Inject 120 mg into the skin every 30 (thirty) days.    Dispense:  1 pen    Refill:  11  . DISCONTD: Galcanezumab-gnlm (EMGALITY) 120 MG/ML SOAJ    Sig: Inject  240 mg into the skin once for 1 dose.    Dispense:  2 pen    Refill:  0  . Galcanezumab-gnlm (EMGALITY) 120 MG/ML SOAJ    Sig: Inject 240 mg into the skin once for 1 dose.    Dispense:  2 pen    Refill:  0    Cc: Emi Belfast, FNP,  Emi Belfast, FNP  Naomie Dean, MD  [facility] [address] [place], [place] [address]  Phone [phone] Fax [phone]

## 2019-02-14 NOTE — Addendum Note (Signed)
Addended by: Naomie Dean B on: 02/14/2019 10:49 AM   Modules accepted: Orders

## 2019-02-15 LAB — BASIC METABOLIC PANEL
BUN/Creatinine Ratio: 15 (ref 9–23)
BUN: 12 mg/dL (ref 6–24)
CO2: 22 mmol/L (ref 20–29)
CREATININE: 0.78 mg/dL (ref 0.57–1.00)
Calcium: 9 mg/dL (ref 8.7–10.2)
Chloride: 105 mmol/L (ref 96–106)
GFR calc Af Amer: 98 mL/min/{1.73_m2} (ref >59)
GFR calc non Af Amer: 85 mL/min/{1.73_m2} (ref >59)
Glucose: 80 mg/dL (ref 65–99)
Potassium: 4.3 mmol/L (ref 3.5–5.2)
Sodium: 138 mmol/L (ref 134–144)

## 2019-02-15 LAB — SEDIMENTATION RATE: Sed Rate: 2 mm/hr (ref 0–40)

## 2019-02-15 LAB — C-REACTIVE PROTEIN: CRP: <1 mg/L (ref 0–10)

## 2019-02-16 ENCOUNTER — Telehealth: Payer: Self-pay | Admitting: Neurology

## 2019-02-16 NOTE — Telephone Encounter (Signed)
error 

## 2019-02-16 NOTE — Telephone Encounter (Signed)
Patient called and informed me that she is not able to have her MRI at [facility] because of the stimulator that she has. I informed her that I would need information about her stimulator like the year, make, model, serial number and implant date in order for [employer] to do their own research to make sure it is MRI compitable. She stated she will call Dr. Ollen Bowl office and get that information and get back to me.

## 2019-02-21 ENCOUNTER — Telehealth: Payer: Self-pay | Admitting: Neurology

## 2019-02-21 NOTE — Telephone Encounter (Signed)
Pt said her internal tens unit is a precision spectra [place]-[id number],  insenion cx: Code [phone]. Suppose to use 50cm but hers is 70cm therefore recommendation no MRI of the body. Please call to advise

## 2019-02-22 NOTE — Telephone Encounter (Signed)
Called pt back @ [phone] and LVM (Ok per [facility]) informing pt that Dr. Lucia Gaskins ordered  CT head w wo contrast instead. Advised she would receive a separate call to schedule the test once ins auth had been done. Left office number and asked for call back if she has any questions.

## 2019-02-22 NOTE — Telephone Encounter (Signed)
Thanks, I ordered CT of the head w/wo contrast

## 2019-02-22 NOTE — Addendum Note (Signed)
Addended by: Naomie Dean B on: 02/22/2019 01:52 PM   Modules accepted: Orders

## 2019-02-22 NOTE — Telephone Encounter (Signed)
Health team order sent to [facility]. No auth they will reach out to the pt to schedule.  °

## 2019-02-27 DIAGNOSIS — F603 Borderline personality disorder: Secondary | ICD-10-CM | POA: Diagnosis not present

## 2019-02-27 DIAGNOSIS — F319 Bipolar disorder, unspecified: Secondary | ICD-10-CM | POA: Diagnosis not present

## 2019-03-06 ENCOUNTER — Other Ambulatory Visit: Payer: Self-pay

## 2019-03-06 ENCOUNTER — Encounter: Payer: Self-pay | Admitting: Family Medicine

## 2019-03-06 ENCOUNTER — Ambulatory Visit (INDEPENDENT_AMBULATORY_CARE_PROVIDER_SITE_OTHER): Payer: PPO | Admitting: Family Medicine

## 2019-03-06 VITALS — BP 102/60 | HR 81 | Temp 98.5°F | Ht 64.0 in | Wt 158.4 lb

## 2019-03-06 DIAGNOSIS — J4520 Mild intermittent asthma, uncomplicated: Secondary | ICD-10-CM

## 2019-03-06 DIAGNOSIS — Z13220 Encounter for screening for lipoid disorders: Secondary | ICD-10-CM

## 2019-03-06 DIAGNOSIS — R32 Unspecified urinary incontinence: Secondary | ICD-10-CM | POA: Diagnosis not present

## 2019-03-06 DIAGNOSIS — Z0000 Encounter for general adult medical examination without abnormal findings: Secondary | ICD-10-CM | POA: Diagnosis not present

## 2019-03-06 LAB — POC URINALSYSI DIPSTICK (AUTOMATED)
Bilirubin, UA: NEGATIVE
Blood, UA: NEGATIVE
Glucose, UA: NEGATIVE
Ketones, UA: NEGATIVE
Leukocytes, UA: NEGATIVE
Nitrite, UA: NEGATIVE
Protein, UA: NEGATIVE
Spec Grav, UA: 1.015 (ref 1.010–1.025)
UROBILINOGEN UA: 0.2 U/dL
pH, UA: 6.5 (ref 5.0–8.0)

## 2019-03-06 MED ORDER — MONTELUKAST SODIUM 10 MG PO TABS: 10.0000 mg | ORAL_TABLET | Freq: Every day | ORAL | 11 refills | Status: DC

## 2019-03-06 NOTE — Patient Instructions (Signed)
Please call and schedule an appointment for screening mammogram. A referral is not needed.  Katelyn Galloway  [phone] Follow up in 6 months  Health Maintenance, Female Adopting a healthy lifestyle and getting preventive care can go a long way to promote health and wellness. Talk with your health care provider about what schedule of regular examinations is right for you. This is a good chance for you to check in with your provider about disease prevention and staying healthy. In between checkups, there are plenty of things you can do on your own. Experts have done a lot of research about which lifestyle changes and preventive measures are most likely to keep you healthy. Ask your health care provider for more information. Weight and diet Eat a healthy diet  Be sure to include plenty of vegetables, fruits, low-fat dairy products, and lean protein.  Do not eat a lot of foods high in solid fats, added sugars, or salt.  Get regular exercise. This is one of the most important things you can do for your health. ? Most adults should exercise for at least 150 minutes each week. The exercise should increase your heart rate and make you sweat (moderate-intensity exercise). ? Most adults should also do strengthening exercises at least twice a week. This is in addition to the moderate-intensity exercise. Maintain a healthy weight  Body mass index (BMI) is a measurement that can be used to identify possible weight problems. It estimates body fat based on height and weight. Your health care provider can help determine your BMI and help you achieve or maintain a healthy weight.  For females 26 years of age and older: ? A BMI below 18.5 is considered underweight. ? A BMI of 18.5 to 24.9 is normal. ? A BMI of 25 to 29.9 is considered overweight. ? A BMI of 30 and above is considered obese. Watch levels of  cholesterol and blood lipids  You should start having your blood tested for lipids and cholesterol at 57 years of age, then have this test every 5 years.  You may need to have your cholesterol levels checked more often if: ? Your lipid or cholesterol levels are high. ? You are older than 57 years of age. ? You are at high risk for heart disease. Cancer screening Lung Cancer  Lung cancer screening is recommended for adults 23-45 years old who are at high risk for lung cancer because of a history of smoking.  A yearly low-dose CT scan of the lungs is recommended for people who: ? Currently smoke. ? Have quit within the past 15 years. ? Have at least a 30-pack-year history of smoking. A pack year is smoking an average of one pack of cigarettes a day for 1 year.  Yearly screening should continue until it has been 15 years since you quit.  Yearly screening should stop if you develop a health problem that would prevent you from having lung cancer treatment. Breast Cancer  Practice breast self-awareness. This means understanding how your breasts normally appear and feel.  It also means doing regular breast self-exams. Let your health care provider know about any changes, no matter how small.  If you are in your 20s or 30s, you should have a clinical breast exam (CBE) by a health care provider every 1-3 years as part of a regular health exam.  If you are 59 or older, have a CBE every year. Also consider having a  breast X-ray (mammogram) every year.  If you have a family history of breast cancer, talk to your health care provider about genetic screening.  If you are at high risk for breast cancer, talk to your health care provider about having an MRI and a mammogram every year.  Breast cancer gene (BRCA) assessment is recommended for women who have family members with BRCA-related cancers. BRCA-related cancers include: ? Breast. ? Ovarian. ? Tubal. ? Peritoneal cancers.  Results of  the assessment will determine the need for genetic counseling and BRCA1 and BRCA2 testing. Cervical Cancer Your health care provider may recommend that you be screened regularly for cancer of the pelvic organs (ovaries, uterus, and vagina). This screening involves a pelvic examination, including checking for microscopic changes to the surface of your cervix (Pap test). You may be encouraged to have this screening done every 3 years, beginning at age 51.  For women ages 47-65, health care providers may recommend pelvic exams and Pap testing every 3 years, or they may recommend the Pap and pelvic exam, combined with testing for human papilloma virus (HPV), every 5 years. Some types of HPV increase your risk of cervical cancer. Testing for HPV may also be done on women of any age with unclear Pap test results.  Other health care providers may not recommend any screening for nonpregnant women who are considered low risk for pelvic cancer and who do not have symptoms. Ask your health care provider if a screening pelvic exam is right for you.  If you have had past treatment for cervical cancer or a condition that could lead to cancer, you need Pap tests and screening for cancer for at least 20 years after your treatment. If Pap tests have been discontinued, your risk factors (such as having a new sexual partner) need to be reassessed to determine if screening should resume. Some women have medical problems that increase the chance of getting cervical cancer. In these cases, your health care provider may recommend more frequent screening and Pap tests. Colorectal Cancer  This type of cancer can be detected and often prevented.  Routine colorectal cancer screening usually begins at 57 years of age and continues through 57 years of age.  Your health care provider may recommend screening at an earlier age if you have risk factors for colon cancer.  Your health care provider may also recommend using home test  kits to check for hidden blood in the stool.  A small camera at the end of a tube can be used to examine your colon directly (sigmoidoscopy or colonoscopy). This is done to check for the earliest forms of colorectal cancer.  Routine screening usually begins at age 15.  Direct examination of the colon should be repeated every 5-10 years through 57 years of age. However, you may need to be screened more often if early forms of precancerous polyps or small growths are found. Skin Cancer  Check your skin from head to toe regularly.  Tell your health care provider about any new moles or changes in moles, especially if there is a change in a mole's shape or color.  Also tell your health care provider if you have a mole that is larger than the size of a pencil eraser.  Always use sunscreen. Apply sunscreen liberally and repeatedly throughout the day.  Protect yourself by wearing long sleeves, pants, a wide-brimmed hat, and sunglasses whenever you are outside. Heart disease, diabetes, and high blood pressure  High blood pressure causes heart  disease and increases the risk of stroke. High blood pressure is more likely to develop in: ? People who have blood pressure in the high end of the normal range (130-139/85-89 mm Hg). ? People who are overweight or obese. ? People who are African American.  If you are 47-88 years of age, have your blood pressure checked every 3-5 years. If you are 8 years of age or older, have your blood pressure checked every year. You should have your blood pressure measured twice-once when you are at a hospital or clinic, and once when you are not at a hospital or clinic. Record the average of the two measurements. To check your blood pressure when you are not at a hospital or clinic, you can use: ? An automated blood pressure machine at a pharmacy. ? A home blood pressure monitor.  If you are between 56 years and 51 years old, ask your health care provider if you should  take aspirin to prevent strokes.  Have regular diabetes screenings. This involves taking a blood sample to check your fasting blood sugar level. ? If you are at a normal weight and have a low risk for diabetes, have this test once every three years after 57 years of age. ? If you are overweight and have a high risk for diabetes, consider being tested at a younger age or more often. Preventing infection Hepatitis B  If you have a higher risk for hepatitis B, you should be screened for this virus. You are considered at high risk for hepatitis B if: ? You were born in a country where hepatitis B is common. Ask your health care provider which countries are considered high risk. ? Your parents were born in a high-risk country, and you have not been immunized against hepatitis B (hepatitis B vaccine). ? You have HIV or AIDS. ? You use needles to inject street drugs. ? You live with someone who has hepatitis B. ? You have had sex with someone who has hepatitis B. ? You get hemodialysis treatment. ? You take certain medicines for conditions, including cancer, organ transplantation, and autoimmune conditions. Hepatitis C  Blood testing is recommended for: ? Everyone born from [birth year] through 1964. ? Anyone with known risk factors for hepatitis C. Sexually transmitted infections (STIs)  You should be screened for sexually transmitted infections (STIs) including gonorrhea and chlamydia if: ? You are sexually active and are younger than 57 years of age. ? You are older than 57 years of age and your health care provider tells you that you are at risk for this type of infection. ? Your sexual activity has changed since you were last screened and you are at an increased risk for chlamydia or gonorrhea. Ask your health care provider if you are at risk.  If you do not have HIV, but are at risk, it may be recommended that you take a prescription medicine daily to prevent HIV infection. This is called  pre-exposure prophylaxis (PrEP). You are considered at risk if: ? You are sexually active and do not regularly use condoms or know the HIV status of your partner(s). ? You take drugs by injection. ? You are sexually active with a partner who has HIV. Talk with your health care provider about whether you are at high risk of being infected with HIV. If you choose to begin PrEP, you should first be tested for HIV. You should then be tested every 3 months for as long as you are taking PrEP.  Pregnancy  If you are premenopausal and you may become pregnant, ask your health care provider about preconception counseling.  If you may become pregnant, take 400 to 800 micrograms (mcg) of folic acid every day.  If you want to prevent pregnancy, talk to your health care provider about birth control (contraception). Osteoporosis and menopause  Osteoporosis is a disease in which the bones lose minerals and strength with aging. This can result in serious bone fractures. Your risk for osteoporosis can be identified using a bone density scan.  If you are 77 years of age or older, or if you are at risk for osteoporosis and fractures, ask your health care provider if you should be screened.  Ask your health care provider whether you should take a calcium or vitamin D supplement to lower your risk for osteoporosis.  Menopause may have certain physical symptoms and risks.  Hormone replacement therapy may reduce some of these symptoms and risks. Talk to your health care provider about whether hormone replacement therapy is right for you. Follow these instructions at home:  Schedule regular health, dental, and eye exams.  Stay current with your immunizations.  Do not use any tobacco products including cigarettes, chewing tobacco, or electronic cigarettes.  If you are pregnant, do not drink alcohol.  If you are breastfeeding, limit how much and how often you drink alcohol.  Limit alcohol intake to no more  than 1 drink per day for nonpregnant women. One drink equals 12 ounces of beer, 5 ounces of wine, or 1 ounces of hard liquor.  Do not use street drugs.  Do not share needles.  Ask your health care provider for help if you need support or information about quitting drugs.  Tell your health care provider if you often feel depressed.  Tell your health care provider if you have ever been abused or do not feel safe at home. This information is not intended to replace advice given to you by your health care provider. Make sure you discuss any questions you have with your health care provider. Document Released: 06/22/2011 Document Revised: 05/14/2016 Document Reviewed: 09/10/2015 Elsevier Interactive Patient Education  2019 [employer].

## 2019-03-06 NOTE — Progress Notes (Signed)
Subjective:    Patient ID: Katelyn Galloway, female    DOB: 17-Feb-1961, 58 y.o.   MRN: [id number]  HPI This is a 58 yo female who presents today for CPE. Continues to have difficult relationship with her SO and his son who lives with them. They have been going to counseling.   Last CPE- unknown Mammo- unknown, willing to have Pap- total hysterctomy Colonoscopy- 02/2013 per epic Tdap- 03/09/08 Flu- annual Eye- regular Dental- regular Exercise- as she is able with fibromyalgia and back pain Tobacco- working on quitting   Past Medical History:  Diagnosis Date  . Anemia 1970  . Anxiety   . Arthritis   . Asthma    child  . Bipolar disorder (HCC)   . Bronchitis    "years ago"  . Bronchitis    chronic hx  . Chronic back pain    hx of  . Depression   . Fibromyalgia   . GERD (gastroesophageal reflux disease)    occ  . Headache(784.0)    migraines  . Heart palpitations    occ if get anxious- no tests  . History of kidney stones   . Hypothyroidism   . Irritable bowel syndrome (IBS)   . Migraine   . Seizures (HCC)    x 2 during pregnancy per pt was not diagnosed with eclampsia   Past Surgical History:  Procedure Laterality Date  . ABDOMINAL HYSTERECTOMY  1992  . BACK SURGERY     x2  . CESAREAN SECTION  1990  . CHOLECYSTECTOMY  2009 or 2010  . HARDWARE REMOVAL  12/29/2012   Procedure: HARDWARE REMOVAL;  Surgeon: Tia Alert, MD;  Location: [facility];  Service: Neurosurgery;  Laterality: N/A;  Lumbar hardware extraction  . kindey stone removal    . KNEE ARTHROSCOPY     right knee  . SPINAL CORD STIMULATOR INSERTION N/A 01/25/2015   Procedure: LUMBAR SPINAL CORD STIMULATOR INSERTION;  Surgeon: Gwynne Edinger, MD;  Location: [facility];  Service: Neurosurgery;  Laterality: N/A;  . TUBAL LIGATION     Family History  Problem Relation Age of Onset  . Depression Mother   . Heart attack Mother   . Suicidality Father   . Heart disease Father   . Alcohol abuse Father    . Irritable bowel syndrome Father   . Prostate cancer Father   . Arthritis Father   . COPD Father   . Emphysema Father   . Migraines Daughter   . Anesthesia problems Neg Hx   . Colon cancer Neg Hx   . Esophageal cancer Neg Hx    Social History   Tobacco Use  . Smoking status: Current Some Day Smoker    Packs/day: 0.50    Years: 43.00    Pack years: 21.50    Types: Cigarettes  . Smokeless tobacco: Never Used  Substance Use Topics  . Alcohol use: No    Frequency: Never  . Drug use: Yes    Frequency: 7.0 times per week    Types: Marijuana    Comment: about 0.5 joint daily      Review of Systems  Constitutional: Negative.   HENT: Negative.   Eyes: Negative.   Respiratory: Positive for shortness of breath (with exertion). Negative for chest tightness.   Cardiovascular: Positive for leg swelling (small amount at end of day).  Gastrointestinal: Negative.   Genitourinary: Positive for difficulty urinating. Negative for dysuria, frequency, hematuria, vaginal bleeding, vaginal discharge and vaginal  pain.       Difficulty starting stream, has to cover her ears and close her eyes. Not sure if related to stress. Had some incontinence a couple of weeks ago. She was sitting and noticed it when she stood up.   Musculoskeletal: Positive for back pain (chronic).  Skin: Negative.   Allergic/Immunologic: Positive for environmental allergies.  Neurological: Positive for headaches (seeing neuro, has CT scheduled for tomorrow).       Tinnitus  Hematological: Negative.   Psychiatric/Behavioral: Positive for dysphoric mood. Negative for self-injury.       Objective:   Physical Exam Physical Exam  Constitutional: She is oriented to person, place, and time. She appears well-developed and well-nourished. No distress.  HENT:  Head: Normocephalic and atraumatic.  Right Ear: External ear normal.  Left Ear: External ear normal.  Nose: Nose normal.  Mouth/Throat: Oropharynx is clear and  moist. No oropharyngeal exudate.  Eyes: Conjunctivae are normal. Pupils are equal, round, and reactive to light.  Neck: Normal range of motion. Neck supple. No JVD present. No thyromegaly present.  Cardiovascular: Normal rate, regular rhythm, normal heart sounds and intact distal pulses.   Pulmonary/Chest: Effort normal and breath sounds normal. Right breast exhibits no inverted nipple, no mass, no nipple discharge, no skin change and no tenderness. Left breast exhibits no inverted nipple, no mass, no nipple discharge, no skin change and no tenderness. Breasts are symmetrical.  Abdominal: Soft. Bowel sounds are normal. She exhibits no distension and no mass. There is no tenderness. There is no rebound and no guarding.  Musculoskeletal: Normal range of motion. She exhibits no edema or tenderness.  Lymphadenopathy:    She has no cervical adenopathy.  Neurological: She is alert and oriented to person, place, and time.   Skin: Skin is warm and dry. She is not diaphoretic.  Psychiatric: She is intermittently tearful. Her behavior is normal. Judgment and thought content normal.  Vitals reviewed.   BP 102/60   Pulse 81   Temp 98.5 F (36.9 C)   Ht 5\' 4"  (1.626 m)   Wt 158 lb 6.4 oz (71.8 kg)   SpO2 99%   BMI 27.19 kg/m  Depression screen [facility] 2/9 03/06/2019 12/09/2018 10/18/2017  Decreased Interest 1 0 0  Down, Depressed, Hopeless 1 0 0  PHQ - 2 Score 2 0 0  Altered sleeping 2 - -  Tired, decreased energy 2 - -  Change in appetite 0 - -  Feeling bad or failure about yourself  2 - -  Trouble concentrating 2 - -  Moving slowly or fidgety/restless 0 - -  Suicidal thoughts 0 - -  PHQ-9 Score 10 - -   Wt Readings from Last 3 Encounters:  03/06/19 158 lb 6.4 oz (71.8 kg)  02/14/19 154 lb (69.9 kg)  12/09/18 150 lb 12 oz (68.4 kg)       Assessment & Plan:  1. Annual physical exam - Discussed and encouraged healthy lifestyle choices- adequate sleep, regular exercise, stress management  and healthy food choices.  - provided her with information to schedule mammogram  2. Mild intermittent asthma without complication - will add montelukast  3. Screening for lipid disorders - Lipid Panel  4. Urinary incontinence, unspecified type - POCT Urinalysis Dipstick (Automated)   [name], FNP-BC  [facility] [place], [facility]  03/08/2019 8:38 AM

## 2019-03-07 LAB — LIPID PANEL
CHOLESTEROL: 222 mg/dL — AB (ref 0–200)
HDL: 64.6 mg/dL (ref >39.00)
LDL Cholesterol: 135 mg/dL — ABNORMAL HIGH (ref 0–99)
NonHDL: 157.09
Total CHOL/HDL Ratio: 3
Triglycerides: 112 mg/dL (ref 0.0–149.0)
VLDL: 22.4 mg/dL (ref 0.0–40.0)

## 2019-03-08 ENCOUNTER — Encounter: Payer: Self-pay | Admitting: Family Medicine

## 2019-03-09 ENCOUNTER — Telehealth: Payer: Self-pay

## 2019-03-09 NOTE — Telephone Encounter (Signed)
Patient returned Healther's call.  Please call patient back at [phone].

## 2019-03-09 NOTE — Telephone Encounter (Signed)
Pt called no answer voice mail left for pt to call back 

## 2019-03-10 ENCOUNTER — Telehealth: Payer: Self-pay

## 2019-03-10 NOTE — Telephone Encounter (Signed)
Pt called and given results of lab work

## 2019-03-11 ENCOUNTER — Other Ambulatory Visit: Payer: Self-pay | Admitting: Family Medicine

## 2019-03-13 DIAGNOSIS — F319 Bipolar disorder, unspecified: Secondary | ICD-10-CM | POA: Diagnosis not present

## 2019-03-13 DIAGNOSIS — F603 Borderline personality disorder: Secondary | ICD-10-CM | POA: Diagnosis not present

## 2019-03-14 ENCOUNTER — Other Ambulatory Visit: Payer: Self-pay

## 2019-03-14 ENCOUNTER — Ambulatory Visit
Admission: RE | Admit: 2019-03-14 | Discharge: 2019-03-14 | Disposition: A | Discharge status: Home / Self Care | Payer: PPO | Source: Ambulatory Visit | Attending: Neurology | Admitting: Neurology

## 2019-03-14 DIAGNOSIS — H9190 Unspecified hearing loss, unspecified ear: Secondary | ICD-10-CM | POA: Diagnosis not present

## 2019-03-14 DIAGNOSIS — G441 Vascular headache, not elsewhere classified: Secondary | ICD-10-CM

## 2019-03-14 DIAGNOSIS — H539 Unspecified visual disturbance: Secondary | ICD-10-CM

## 2019-03-14 DIAGNOSIS — R51 Headache with orthostatic component, not elsewhere classified: Secondary | ICD-10-CM

## 2019-03-14 DIAGNOSIS — R519 Headache, unspecified: Secondary | ICD-10-CM

## 2019-03-15 DIAGNOSIS — M961 Postlaminectomy syndrome, not elsewhere classified: Secondary | ICD-10-CM | POA: Diagnosis not present

## 2019-03-15 DIAGNOSIS — G43919 Migraine, unspecified, intractable, without status migrainosus: Secondary | ICD-10-CM | POA: Diagnosis not present

## 2019-03-27 DIAGNOSIS — F603 Borderline personality disorder: Secondary | ICD-10-CM | POA: Diagnosis not present

## 2019-03-27 DIAGNOSIS — F319 Bipolar disorder, unspecified: Secondary | ICD-10-CM | POA: Diagnosis not present

## 2019-03-29 ENCOUNTER — Telehealth: Payer: Self-pay | Admitting: Neurology

## 2019-03-29 NOTE — Telephone Encounter (Signed)
Spoke with patient and advised that she call [phone] ([phone]) to inquire into potential replacement. She verbalized appreciation. Pt advised to call back if needed.

## 2019-03-29 NOTE — Telephone Encounter (Signed)
Pt called in and stated she was given herself her inection of Galcanezumab-gnlm (EMGALITY) 120 MG/ML SOAJ and failed and bent the needle and wants to know what can be done

## 2019-03-31 ENCOUNTER — Encounter: Payer: Self-pay | Admitting: Family Medicine

## 2019-04-03 ENCOUNTER — Encounter: Payer: Self-pay | Admitting: Family Medicine

## 2019-04-03 ENCOUNTER — Other Ambulatory Visit: Payer: Self-pay | Admitting: Family Medicine

## 2019-04-03 DIAGNOSIS — R8290 Unspecified abnormal findings in urine: Secondary | ICD-10-CM

## 2019-04-03 MED ORDER — SULFAMETHOXAZOLE-TRIMETHOPRIM 800-160 MG PO TABS: 1.0000 | ORAL_TABLET | Freq: Two times a day (BID) | ORAL | 0 refills | Status: DC

## 2019-04-10 ENCOUNTER — Encounter: Payer: Self-pay | Admitting: Family Medicine

## 2019-04-10 ENCOUNTER — Other Ambulatory Visit: Payer: Self-pay | Admitting: Family Medicine

## 2019-04-10 DIAGNOSIS — E039 Hypothyroidism, unspecified: Secondary | ICD-10-CM

## 2019-04-12 ENCOUNTER — Encounter: Payer: Self-pay | Admitting: Family Medicine

## 2019-04-17 DIAGNOSIS — F603 Borderline personality disorder: Secondary | ICD-10-CM | POA: Diagnosis not present

## 2019-04-17 DIAGNOSIS — F319 Bipolar disorder, unspecified: Secondary | ICD-10-CM | POA: Diagnosis not present

## 2019-05-01 ENCOUNTER — Other Ambulatory Visit: Payer: PPO

## 2019-05-01 ENCOUNTER — Encounter: Payer: Self-pay | Admitting: Family Medicine

## 2019-05-01 ENCOUNTER — Ambulatory Visit (INDEPENDENT_AMBULATORY_CARE_PROVIDER_SITE_OTHER): Payer: PPO | Admitting: Family Medicine

## 2019-05-01 VITALS — Ht 64.0 in | Wt 148.0 lb

## 2019-05-01 DIAGNOSIS — R3915 Urgency of urination: Secondary | ICD-10-CM | POA: Diagnosis not present

## 2019-05-01 DIAGNOSIS — N898 Other specified noninflammatory disorders of vagina: Secondary | ICD-10-CM | POA: Diagnosis not present

## 2019-05-01 DIAGNOSIS — F603 Borderline personality disorder: Secondary | ICD-10-CM | POA: Diagnosis not present

## 2019-05-01 DIAGNOSIS — F319 Bipolar disorder, unspecified: Secondary | ICD-10-CM | POA: Diagnosis not present

## 2019-05-01 LAB — POCT URINALYSIS DIPSTICK
Bilirubin, UA: NEGATIVE
Blood, UA: NEGATIVE
Glucose, UA: NEGATIVE
Ketones, UA: NEGATIVE
Leukocytes, UA: NEGATIVE
Nitrite, UA: NEGATIVE
Protein, UA: NEGATIVE
Spec Grav, UA: 1.015 (ref 1.010–1.025)
Urobilinogen, UA: 0.2 E.U./dL
pH, UA: 6 (ref 5.0–8.0)

## 2019-05-01 MED ORDER — FLUCONAZOLE 150 MG PO TABS: ORAL_TABLET | ORAL | 0 refills | Status: AC

## 2019-05-01 NOTE — Progress Notes (Signed)
Virtual Visit via Video Note  I connected with Katelyn Galloway on 05/01/19 at  4:00 PM EDT by a video enabled telemedicine application and verified that I am speaking with the correct person using two identifiers.  Location: Patient: Parking lot Provider: [facility]   I discussed the limitations of evaluation and management by telemedicine and the availability of in person appointments. The patient expressed understanding and agreed to proceed.  History of Present Illness: This is a 58 year old female who requests virtual visit today to discuss urinary tract symptoms.  She reports that she has urinary frequency and urgency along with a vaginal odor but no discharge.  Symptoms started a little over a month ago.  She had been on some prednisone and requested a prescription for UTI and completed a course of sulfamethoxazole trimethoprim twice daily for 3 days (04/02/18).  She denies dysuria, hematuria, incontinence.  She does not have vaginal itching or burning.  No rash, fever or new back pain.  She does have a history of fibromyalgia and has been told she has interstitial cystitis in the past.  She reports that she only drinks coffee and water.  She has not added any new medications.  She has added a fragrance booster to her laundry but this is a similar product to what she is used in the past.  No new soaps lotions or shampoos.  She has history of chronic headaches and these are recently improved.  Past Medical History:  Diagnosis Date  . Anemia 1970  . Anxiety   . Arthritis   . Asthma    child  . Bipolar disorder (HCC)   . Bronchitis    "years ago"  . Bronchitis    chronic hx  . Chronic back pain    hx of  . Depression   . Fibromyalgia   . GERD (gastroesophageal reflux disease)    occ  . Headache(784.0)    migraines  . Heart palpitations    occ if get anxious- no tests  . History of kidney stones   . Hypothyroidism   . Irritable bowel syndrome (IBS)   . Migraine    . Seizures (HCC)    x 2 during pregnancy per pt was not diagnosed with eclampsia   Past Surgical History:  Procedure Laterality Date  . ABDOMINAL HYSTERECTOMY  1992  . BACK SURGERY     x2  . CESAREAN SECTION  1990  . CHOLECYSTECTOMY  2009 or 2010  . HARDWARE REMOVAL  12/29/2012   Procedure: HARDWARE REMOVAL;  Surgeon: Tia Alert, MD;  Location: [facility];  Service: Neurosurgery;  Laterality: N/A;  Lumbar hardware extraction  . kindey stone removal    . KNEE ARTHROSCOPY     right knee  . SPINAL CORD STIMULATOR INSERTION N/A 01/25/2015   Procedure: LUMBAR SPINAL CORD STIMULATOR INSERTION;  Surgeon: Gwynne Edinger, MD;  Location: [facility];  Service: Neurosurgery;  Laterality: N/A;  . TUBAL LIGATION     Family History  Problem Relation Age of Onset  . Depression Mother   . Heart attack Mother   . Suicidality Father   . Heart disease Father   . Alcohol abuse Father   . Irritable bowel syndrome Father   . Prostate cancer Father   . Arthritis Father   . COPD Father   . Emphysema Father   . Migraines Daughter   . Anesthesia problems Neg Hx   . Colon cancer Neg Hx   . Esophageal  cancer Neg Hx    Social History   Tobacco Use  . Smoking status: Former Smoker    Packs/day: 0.50    Years: 43.00    Pack years: 21.50    Types: Cigarettes    Last attempt to quit: 03/20/2019    Years since quitting: 0.1  . Smokeless tobacco: Never Used  Substance Use Topics  . Alcohol use: No    Frequency: Never  . Drug use: Yes    Frequency: 7.0 times per week    Types: Marijuana    Comment: about 0.5 joint daily      Observations/Objective: The patient is alert and answers questions appropriately.  Visible skin is unremarkable.  She is normally conversive without shortness of breath.  Her mood and affect are appropriate. Ht 5\' 4"  (1.626 m)   Wt 148 lb (67.1 kg) Comment: per patient  BMI 25.40 kg/m  Results for orders placed or performed in visit on 05/01/19  POCT urinalysis  dipstick  Result Value Ref Range   Color, UA light yellow    Clarity, UA clear    Glucose, UA Negative Negative   Bilirubin, UA negative    Ketones, UA negative    Spec Grav, UA 1.015 1.010 - 1.025   Blood, UA negative    pH, UA 6.0 5.0 - 8.0   Protein, UA Negative Negative   Urobilinogen, UA 0.2 0.2 or 1.0 E.U./dL   Nitrite, UA negative    Leukocytes, UA Negative Negative   Appearance     Odor      Assessment and Plan: 1. Urinary urgency -Suspect this is likely related to her interstitial cystitis and not a bacterial etiology.  I am going to go ahead and treat her for possible vaginal yeast. -Information sent to her via MyChart regarding interstitial cystitis - POCT urinalysis dipstick  2. Vaginal odor - fluconazole (DIFLUCAN) 150 MG tablet; Repeat in 48 hours.  Dispense: 2 tablet; Refill: 0  -She will follow-up in the office in the next 6 to 8 weeks, sooner if symptoms worsen Katelyn Reeeborah Dushaun Okey, FNP-BC  [name]  05/01/2019 4:28 PM   Follow Up Instructions: Visit recap and instructions were sent to patient via my chart   I discussed the assessment and treatment plan with the patient. The patient was provided an opportunity to ask questions and all were answered. The patient agreed with the plan and demonstrated an understanding of the instructions.   The patient was advised to call back or seek an in-person evaluation if the symptoms worsen or if the condition fails to improve as anticipated.   [name], FNP

## 2019-05-09 ENCOUNTER — Other Ambulatory Visit: Payer: Self-pay | Admitting: Neurology

## 2019-05-09 DIAGNOSIS — M7918 Myalgia, other site: Secondary | ICD-10-CM | POA: Diagnosis not present

## 2019-05-09 DIAGNOSIS — G43711 Chronic migraine without aura, intractable, with status migrainosus: Secondary | ICD-10-CM

## 2019-05-09 DIAGNOSIS — Z6825 Body mass index (BMI) 25.0-25.9, adult: Secondary | ICD-10-CM | POA: Diagnosis not present

## 2019-05-11 DIAGNOSIS — F603 Borderline personality disorder: Secondary | ICD-10-CM | POA: Diagnosis not present

## 2019-05-11 DIAGNOSIS — F319 Bipolar disorder, unspecified: Secondary | ICD-10-CM | POA: Diagnosis not present

## 2019-05-12 ENCOUNTER — Telehealth: Payer: Self-pay | Admitting: Diagnostic Neuroimaging

## 2019-05-12 DIAGNOSIS — G43711 Chronic migraine without aura, intractable, with status migrainosus: Secondary | ICD-10-CM

## 2019-05-12 MED ORDER — GALCANEZUMAB-GNLM 120 MG/ML ~~LOC~~ SOAJ: 120.0000 mg | SUBCUTANEOUS | 11 refills | Status: AC

## 2019-05-12 NOTE — Telephone Encounter (Signed)
Meds ordered this encounter  Medications  . Galcanezumab-gnlm (EMGALITY) 120 MG/ML SOAJ    Sig: Inject 120 mg into the skin every 30 (thirty) days.    Dispense:  1 pen    Refill:  11   Patient states her rx for emgality was cancelled. Sending new rx in.    Suanne Marker, MD 05/12/2019, 2:14 PM Certified in Neurology, Neurophysiology and Neuroimaging  [facility] [address], [address] [place], [place] [address] [phone]

## 2019-05-22 DIAGNOSIS — F319 Bipolar disorder, unspecified: Secondary | ICD-10-CM | POA: Diagnosis not present

## 2019-05-22 DIAGNOSIS — F603 Borderline personality disorder: Secondary | ICD-10-CM | POA: Diagnosis not present

## 2019-05-23 ENCOUNTER — Encounter: Payer: Self-pay | Admitting: Family Medicine

## 2019-05-23 ENCOUNTER — Telehealth: Payer: Self-pay | Admitting: Family Medicine

## 2019-05-23 NOTE — Telephone Encounter (Signed)
Pt has appointment 6/8  Pt stated since she has copd  Does she have to wear mask while she is in office.  Best number [phone]  She wants to do what right for her and the other patients and staff

## 2019-05-24 NOTE — Telephone Encounter (Signed)
Please call patient and tell her that if she is unable to tolerate wearing a mask in the office, we should do a virtual visit if possible. Wearing a mask in the office is mandatory.

## 2019-05-24 NOTE — Telephone Encounter (Signed)
Katelyn Galloway notified as instructed by telephone.  She states she can tolerate a mask while in office for her appointment.

## 2019-05-25 ENCOUNTER — Telehealth: Payer: Self-pay | Admitting: Family Medicine

## 2019-05-25 NOTE — Telephone Encounter (Signed)
Patient stated that she has recently spoke with you about having labs for epic genetics.  She stated she would like to have these done at her visit. she has the vials for these to be done  Patient Phone- [phone]

## 2019-05-26 NOTE — Telephone Encounter (Signed)
Will discuss at upcoming visit.

## 2019-05-29 ENCOUNTER — Telehealth: Payer: Self-pay

## 2019-05-29 ENCOUNTER — Other Ambulatory Visit: Payer: Self-pay

## 2019-05-29 ENCOUNTER — Ambulatory Visit (INDEPENDENT_AMBULATORY_CARE_PROVIDER_SITE_OTHER): Payer: PPO | Admitting: Family Medicine

## 2019-05-29 VITALS — BP 128/66 | HR 79 | Temp 99.0°F | Resp 20 | Ht 64.0 in | Wt 147.0 lb

## 2019-05-29 DIAGNOSIS — G8929 Other chronic pain: Secondary | ICD-10-CM | POA: Diagnosis not present

## 2019-05-29 DIAGNOSIS — R8290 Unspecified abnormal findings in urine: Secondary | ICD-10-CM | POA: Diagnosis not present

## 2019-05-29 DIAGNOSIS — T7431XA Adult psychological abuse, confirmed, initial encounter: Secondary | ICD-10-CM

## 2019-05-29 DIAGNOSIS — M5442 Lumbago with sciatica, left side: Secondary | ICD-10-CM | POA: Diagnosis not present

## 2019-05-29 DIAGNOSIS — M5441 Lumbago with sciatica, right side: Secondary | ICD-10-CM | POA: Diagnosis not present

## 2019-05-29 DIAGNOSIS — M797 Fibromyalgia: Secondary | ICD-10-CM

## 2019-05-29 MED ORDER — LIDOCAINE HCL 4 % EX GEL: 1.0000 "application " | Freq: Three times a day (TID) | CUTANEOUS | 3 refills | Status: AC | PRN

## 2019-05-29 MED ORDER — ALBUTEROL SULFATE HFA 108 (90 BASE) MCG/ACT IN AERS: 2.0000 | INHALATION_SPRAY | RESPIRATORY_TRACT | Status: DC | PRN

## 2019-05-29 MED ORDER — ALBUTEROL SULFATE HFA 108 (90 BASE) MCG/ACT IN AERS: 2.0000 | INHALATION_SPRAY | RESPIRATORY_TRACT | 3 refills | Status: AC | PRN
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
## 2019-05-29 NOTE — Patient Instructions (Signed)
Decrease your topiramate to 50 twice a day  Contact the [employer] center on [address] about legal assistance. Their number is [phone].

## 2019-05-29 NOTE — Telephone Encounter (Signed)
Pt is calling in and stated she would like to know the next step after her CT scan , she states she doesn't want to have to pay for a Video Visit to discuss her CT Scan due to the report from her CT saying it was normal.

## 2019-05-29 NOTE — Telephone Encounter (Signed)
Unable to get in contact with the patient. I left a voicemail asking the patient to return my call. Office number was provided.   If patient calls back please schedule a doxy.me visit with Amy.

## 2019-05-30 NOTE — Telephone Encounter (Signed)
Please see what Dr Jaynee Eagles would like to do. I have not seen patient. I am happy to see her and get her started on Emgality but she will need a visit with me.

## 2019-05-30 NOTE — Telephone Encounter (Signed)
I prescribed emgality at the time of the appointment. If she wants to change medications or has something to discuss with me she needs an appointment. thanks

## 2019-05-30 NOTE — Telephone Encounter (Signed)
Spoke with the patient and she stated that she doesn't have any particular concerns since her CT was normal. She stated that Emgality is working well for her. She has given verbal consent to do a doxy.me visit with Amy on 06/15/2019 and to file her insurance. E-mail has been confirmed and sent.   E-mail: [email or web address]

## 2019-06-04 ENCOUNTER — Encounter: Payer: Self-pay | Admitting: Family Medicine

## 2019-06-05 DIAGNOSIS — F319 Bipolar disorder, unspecified: Secondary | ICD-10-CM | POA: Diagnosis not present

## 2019-06-05 DIAGNOSIS — F603 Borderline personality disorder: Secondary | ICD-10-CM | POA: Diagnosis not present

## 2019-06-06 ENCOUNTER — Other Ambulatory Visit (INDEPENDENT_AMBULATORY_CARE_PROVIDER_SITE_OTHER): Payer: PPO

## 2019-06-06 DIAGNOSIS — G441 Vascular headache, not elsewhere classified: Secondary | ICD-10-CM | POA: Diagnosis not present

## 2019-06-06 DIAGNOSIS — M7918 Myalgia, other site: Secondary | ICD-10-CM | POA: Diagnosis not present

## 2019-06-07 DIAGNOSIS — D8989 Other specified disorders involving the immune mechanism, not elsewhere classified: Secondary | ICD-10-CM | POA: Diagnosis not present

## 2019-06-07 DIAGNOSIS — D838 Other common variable immunodeficiencies: Secondary | ICD-10-CM | POA: Diagnosis not present

## 2019-06-07 DIAGNOSIS — D831 Common variable immunodeficiency with predominant immunoregulatory T-cell disorders: Secondary | ICD-10-CM | POA: Diagnosis not present

## 2019-06-15 ENCOUNTER — Ambulatory Visit (INDEPENDENT_AMBULATORY_CARE_PROVIDER_SITE_OTHER): Payer: PPO | Admitting: Family Medicine

## 2019-06-15 ENCOUNTER — Encounter: Payer: Self-pay | Admitting: Family Medicine

## 2019-06-15 ENCOUNTER — Other Ambulatory Visit: Payer: Self-pay

## 2019-06-15 DIAGNOSIS — G43709 Chronic migraine without aura, not intractable, without status migrainosus: Secondary | ICD-10-CM

## 2019-06-15 NOTE — Progress Notes (Signed)
PATIENT: Katelyn Galloway DOB: 05/31/1961  REASON FOR VISIT: follow up HISTORY FROM: patient  Virtual Visit via Telephone Note  I connected with Katelyn Galloway on 06/15/19 at  2:00 PM EDT by telephone and verified that I am speaking with the correct person using two identifiers.   I discussed the limitations, risks, security and privacy concerns of performing an evaluation and management service by telephone and the availability of in person appointments. I also discussed with the patient that there may be a patient responsible charge related to this service.  Patient was located in a retail store.  She was advised that we could continue appointment at a later time.  She reports being in an secure area.  The patient expressed understanding and agreed to proceed.   History of Present Illness:  06/15/19 Katelyn Galloway is a 58 y.o. female here today for follow up of migraines. She is taking Emgality and topiramate for migraine prevention. She is also taking venlafaxine, amitriptyline, and carbamazepine for mood/sleep managed by her PCP. She reports that her migraines have significantly improved since starting Emgality.  Baseline was greater than 15 migraines per month.  Now she reports 2-4 migraines per month.  She can take 2 extra strength Tylenol rapid release for abortive therapy that works well.  She is feeling good today and without complaints.  She is tolerating Emgality well with no side effects.   History (copied from Dr Trevor MaceAhern's note on 02/14/2019)  HPI:  Katelyn Galloway is a 58 y.o. female here as requested by Odette FractionPaul Harkins MD for headaches.  She has a past medical history of lumbar postlaminectomy syndrome managed by Dr. Ollen BowlHarkins, fibromyalgia, bipolar disorder, was on long-term opioid medications but from note was able to ultimately eliminate this.  However she is on multiple medications including carbamazepine, Celexa, Lyrica, meloxicam for pain or mood.   She tried  gabapentin and there is an allergy listed for this.  She was started on Topamax for her migraines and sent here for follow-up.  Carbamazepine Celexa and Lyrica are also medications that can be used in migraine prevention so currently she is on at least for migraine prevention medications. Also on Amitriptyline and effexor. Mother and daughter have migraines. Wakes with migraines, positional in quality, vision and hearing changes and right hearing loss.vertigo.   She has always had headaches, these are continuous 24x7, can be 10/10 in pain, she has brain zaps on the right side of the head, and in the forehead bilaterally, she as light sensitivity, she has nausea, they can be throbbing. Throbbing headache mostly on the right, going into a dark room helps. Noise bothers her. movement makes it worse. Ongoing for a year. Daily migraines at least 15 days a month the migraines are severe and moderately severe. Her head feels sore all the time. She is under high stress.No other focal neurologic deficits, associated symptoms, inciting events or modifiable factors.  Reviewed notes, labs and imaging from outside physicians, which showed:  Reviewed notes by Dr. Ollen BowlHarkins.  She was seen there for Worker's Compensation for assistance managing her post laminectomy syndrome.  She might ultimately underwent implantation of a spinal cord stimulator and she continues to use low dose medications to manage the rest of her pains.  She also has a diagnosis apparently of fibromyalgia along with bipolar disorder. She continues to have some degree of sciatic pain on her right side. There appears to be many life stressors from the notes.  She is  a smoker and has been trying to quit smoking.  She has been on Neurontin and more recently lyrical for her fibromyalgia pain.  She also has midthoracic musculoskeletal pain.  She has been having migraine headaches on a more frequent basis, mostly frontotemporal left side.   tsh normal, cbc  and cmp unremarkable    Observations/Objective:  Generalized: Well developed, in no acute distress  Mentation: Alert oriented to time, place, history taking. Follows all commands speech and language fluent   Assessment and Plan:  58 y.o. year old female  has a past medical history of Anemia (1970), Anxiety, Arthritis, Asthma, Bipolar disorder ([place]), Bronchitis, Bronchitis, Chronic back pain, Depression, Fibromyalgia, GERD (gastroesophageal reflux disease), Headache(784.0), Heart palpitations, History of kidney stones, Hypothyroidism, Irritable bowel syndrome (IBS), Migraine, and Seizures ([place]). here with    ICD-10-CM   1. Chronic migraine without aura without status migrainosus, not intractable  G43.709    Katelyn Galloway has done very well on Emgality therapy.  She is tolerating this medication well without obvious adverse effects.  We will continue Emgality as well as topiramate for migraine prevention.  She will continue close follow-up with primary care as well.  She was advised to continue Tylenol 1000 mg up to 4 times a day for abortive therapy.  She was also advised to avoid Tylenol more than twice weekly.  We will follow-up annually.  She verbalizes understanding and agreement with this plan.     No orders of the defined types were placed in this encounter.   No orders of the defined types were placed in this encounter.    Follow Up Instructions:  I discussed the assessment and treatment plan with the patient. The patient was provided an opportunity to ask questions and all were answered. The patient agreed with the plan and demonstrated an understanding of the instructions.   The patient was advised to call back or seek an in-person evaluation if the symptoms worsen or if the condition fails to improve as anticipated.  I provided 15 minutes of non-face-to-face time during this encounter.  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  She reports being in a secure  area.  Provider is located at her place of residence.  Maryelizabeth Kaufmann, CMA helped to facilitate visit.   Debbora Presto, NP

## 2019-06-17 ENCOUNTER — Encounter: Payer: Self-pay | Admitting: Family Medicine

## 2019-06-17 DIAGNOSIS — J029 Acute pharyngitis, unspecified: Secondary | ICD-10-CM | POA: Diagnosis not present

## 2019-06-19 ENCOUNTER — Encounter: Payer: Self-pay | Admitting: Family Medicine

## 2019-06-19 NOTE — Progress Notes (Signed)
Made any corrections needed, and agree with history, physical, neuro exam,assessment and plan as stated.     Jaymari Cromie, MD [facility]  

## 2019-06-21 ENCOUNTER — Telehealth: Payer: Self-pay | Admitting: *Deleted

## 2019-06-21 ENCOUNTER — Other Ambulatory Visit: Payer: Self-pay | Admitting: Family Medicine

## 2019-06-21 ENCOUNTER — Encounter: Payer: Self-pay | Admitting: Family Medicine

## 2019-06-21 DIAGNOSIS — G8929 Other chronic pain: Secondary | ICD-10-CM

## 2019-06-21 DIAGNOSIS — M5442 Lumbago with sciatica, left side: Secondary | ICD-10-CM

## 2019-06-21 NOTE — Telephone Encounter (Signed)
Electronic refill request Baclofen Last refill 12/26/18 #180/1 Last office visit 05/29/19

## 2019-06-21 NOTE — Telephone Encounter (Signed)
Contacted patient regarding lung screening scan. Reports she is out of town until August and would like to be contacted at that time.

## 2019-06-21 NOTE — Telephone Encounter (Signed)
Electronic refill meloxicam Last refill 12/10/18 #90/1 Last office visit 6/820

## 2019-06-22 ENCOUNTER — Encounter: Payer: Self-pay | Admitting: Family Medicine

## 2019-06-22 ENCOUNTER — Other Ambulatory Visit: Payer: Self-pay | Admitting: Family Medicine

## 2019-06-26 ENCOUNTER — Encounter: Payer: Self-pay | Admitting: Family Medicine

## 2019-06-26 ENCOUNTER — Other Ambulatory Visit: Payer: Self-pay | Admitting: Family Medicine

## 2019-06-26 DIAGNOSIS — F603 Borderline personality disorder: Secondary | ICD-10-CM | POA: Diagnosis not present

## 2019-06-26 DIAGNOSIS — F319 Bipolar disorder, unspecified: Secondary | ICD-10-CM | POA: Diagnosis not present

## 2019-06-26 NOTE — Telephone Encounter (Signed)
The prescription was refilled last week, but I sent a mychart message to the pt confirming the pharmacy she wanted it sent to and I will resend if needed

## 2019-06-27 NOTE — Telephone Encounter (Signed)
Called and spoke with pharmacist in [place]. They did not receive RX for Baclofen. They took a verbal approval. Called patient and left detailed message letting her know this has been taking care of and apologized for the delay.

## 2019-07-04 DIAGNOSIS — M5440 Lumbago with sciatica, unspecified side: Secondary | ICD-10-CM | POA: Diagnosis not present

## 2019-07-04 DIAGNOSIS — M542 Cervicalgia: Secondary | ICD-10-CM | POA: Diagnosis not present

## 2019-07-04 DIAGNOSIS — Z9689 Presence of other specified functional implants: Secondary | ICD-10-CM | POA: Diagnosis not present

## 2019-07-06 ENCOUNTER — Encounter: Payer: Self-pay | Admitting: Family Medicine

## 2019-07-13 DIAGNOSIS — R1031 Right lower quadrant pain: Secondary | ICD-10-CM | POA: Diagnosis not present

## 2019-07-13 DIAGNOSIS — R351 Nocturia: Secondary | ICD-10-CM | POA: Diagnosis not present

## 2019-07-13 DIAGNOSIS — R350 Frequency of micturition: Secondary | ICD-10-CM | POA: Diagnosis not present

## 2019-07-27 ENCOUNTER — Encounter: Payer: Self-pay | Admitting: Family Medicine

## 2019-08-14 DIAGNOSIS — R52 Pain, unspecified: Secondary | ICD-10-CM | POA: Diagnosis not present

## 2019-08-14 DIAGNOSIS — R000 Tachycardia, unspecified: Secondary | ICD-10-CM | POA: Diagnosis not present

## 2019-08-14 DIAGNOSIS — S199XXA Unspecified injury of neck, initial encounter: Secondary | ICD-10-CM | POA: Diagnosis not present

## 2019-08-14 DIAGNOSIS — S0083XA Contusion of other part of head, initial encounter: Secondary | ICD-10-CM | POA: Diagnosis not present

## 2019-08-14 DIAGNOSIS — M47812 Spondylosis without myelopathy or radiculopathy, cervical region: Secondary | ICD-10-CM | POA: Diagnosis not present

## 2019-08-14 DIAGNOSIS — S0011XA Contusion of right eyelid and periocular area, initial encounter: Secondary | ICD-10-CM | POA: Diagnosis not present

## 2019-08-14 DIAGNOSIS — M5030 Other cervical disc degeneration, unspecified cervical region: Secondary | ICD-10-CM | POA: Diagnosis not present

## 2019-08-14 DIAGNOSIS — M542 Cervicalgia: Secondary | ICD-10-CM | POA: Diagnosis not present

## 2019-08-14 DIAGNOSIS — S0181XA Laceration without foreign body of other part of head, initial encounter: Secondary | ICD-10-CM | POA: Diagnosis not present

## 2019-08-14 DIAGNOSIS — Y999 Unspecified external cause status: Secondary | ICD-10-CM | POA: Diagnosis not present

## 2019-08-14 DIAGNOSIS — S01112A Laceration without foreign body of left eyelid and periocular area, initial encounter: Secondary | ICD-10-CM | POA: Diagnosis not present

## 2019-08-18 ENCOUNTER — Encounter: Payer: Self-pay | Admitting: Family Medicine

## 2019-08-18 NOTE — Telephone Encounter (Signed)
Called and spoke with patient. She is currently safe, in a different shelter. Working with someone on her home title. Getting food assistance. Seeing Dr.Kaur, has follow up on file.

## 2019-08-21 ENCOUNTER — Other Ambulatory Visit: Payer: Self-pay | Admitting: Pharmacist

## 2019-08-21 NOTE — Patient Outreach (Addendum)
[facility]) Care Management  [place]  08/21/2019  Katelyn Galloway 10-17-1961 [id number]   Reason for referral: Medication Assistance, patient homeless, states medications cost $200/month  Referral source: [facility] Referral Current insurance: [employer]  Outreach:  Unsuccessful telephone call attempt #1 to patient.   HIPAA compliant voicemail left requesting a return call  Plan:  -I will make another outreach attempt to patient within 3-4 business days.   -Will note send unsuccessful outreach letter as unclear which shelter patient staying at and for how [place], PharmD, [place] [phone]

## 2019-08-22 ENCOUNTER — Other Ambulatory Visit: Payer: Self-pay

## 2019-08-22 NOTE — Patient Outreach (Signed)
[facility]) Care Management  08/22/2019  [name] [birth year] [id number]  [facility] referral received from [place] on 08/21/19.  Referral stated the following:  Patient currently homeless living in shelter, problems with transportation, difficulty affording medication, monthly income 4400, recently applied for Medicaid.   Unsuccessful outreach to patient today.  Left voicemail message.  Will attempt to reach again within four business days.  Ronn Melena, BSW Social Worker [phone]

## 2019-08-25 ENCOUNTER — Other Ambulatory Visit: Payer: Self-pay | Admitting: Pharmacist

## 2019-08-25 ENCOUNTER — Other Ambulatory Visit: Payer: Self-pay

## 2019-08-25 NOTE — Patient Outreach (Signed)
[place] [facility]) Care Management  08/25/2019  ROMY IPOCK 1961-02-19 [id number]     [facility] referral received from [place] on 08/21/19.  Referral stated the following:  Patient currently homeless living in shelter, problems with transportation, difficulty affording medication, monthly income $400, recently applied for Medicaid.   Left voicemail message for patient on 08/22/19.  Successful outreach to patient today, however, she stated that she has relocated to [place].  Closing case.    Ronn Melena, BSW Social Worker [phone]

## 2019-08-25 NOTE — Patient Outreach (Addendum)
[facility]) Care Management [place]  08/25/2019  [name] 02/15/1961 [id number]  Reason for referral: Medication assistance  Per communication with [place], patient has relocated to [place].    [facility] pharmacy case is being closed due to the following reasons:  -Patient no longer eligible for [facility] services   Ralene Bathe, PharmD, [place] [phone]

## 2019-09-07 ENCOUNTER — Other Ambulatory Visit: Payer: Self-pay | Admitting: Family Medicine

## 2019-09-07 DIAGNOSIS — E039 Hypothyroidism, unspecified: Secondary | ICD-10-CM

## 2019-09-08 NOTE — Telephone Encounter (Signed)
LOV 05/29/2019. No future appointments made. Last filled on 03/13/2019 for #30 with 5 refills.

## 2019-09-22 ENCOUNTER — Encounter: Payer: Self-pay | Admitting: *Deleted

## 2019-09-23 ENCOUNTER — Encounter: Payer: Self-pay | Admitting: Family Medicine
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
## 2019-09-26 ENCOUNTER — Telehealth: Payer: Self-pay

## 2019-09-26 NOTE — Telephone Encounter (Signed)
Left voicemail for pt to inform her it is time for her annual lung cancer screening. Instructed her to call back to confirm information prior to CT scan being scheduled.

## 2019-10-04 ENCOUNTER — Encounter: Payer: Self-pay | Admitting: *Deleted

## 2019-10-05 ENCOUNTER — Other Ambulatory Visit: Payer: Self-pay | Admitting: Family Medicine

## 2019-10-05 NOTE — Telephone Encounter (Signed)
Last refilled 04/10/2019 #90 x 5 refills.  Pt sees Dr Teressa Senter (Counselor)  Please advise Jackelyn Poling, thanks.

## 2019-10-20 MED ORDER — VENLAFAXINE HCL ER 75 MG PO CP24: ORAL_CAPSULE | ORAL | 0 refills | Status: DC

## 2019-10-20 NOTE — Addendum Note (Signed)
Addended by: Helene Shoe on: 10/20/2019 02:07 PM   Modules accepted: Orders

## 2019-10-20 NOTE — Telephone Encounter (Signed)
Pt said [facility] did not get refill for venlafaxine; I spoke with Cecille Rubin at [facility] [place] and she said the pharmacy did not get electronic order for venlafaxine XR 75 mg on 10/05/19. Gave verbal order to lori and pt notified can pick up today after 2:30 pm. Pt voiced understanding.

## 2019-10-25 DIAGNOSIS — M542 Cervicalgia: Secondary | ICD-10-CM | POA: Diagnosis not present

## 2019-10-25 DIAGNOSIS — Z9689 Presence of other specified functional implants: Secondary | ICD-10-CM | POA: Diagnosis not present

## 2019-11-07 ENCOUNTER — Other Ambulatory Visit: Payer: Self-pay

## 2019-11-07 NOTE — Telephone Encounter (Signed)
I received a refill request for Effexor for patient. This was last refilled on 10/20/19 for #90 with 0 refills. Patient was last seen in the office on 05/29/19. Patient has no upcoming appts.  Is this ok to refill?

## 2019-11-08 MED ORDER — VENLAFAXINE HCL ER 75 MG PO CP24: ORAL_CAPSULE | ORAL | 0 refills | Status: DC

## 2019-11-17 ENCOUNTER — Encounter: Payer: Self-pay | Admitting: Family Medicine

## 2019-11-22 ENCOUNTER — Other Ambulatory Visit: Payer: Self-pay

## 2019-11-22 MED ORDER — VENLAFAXINE HCL ER 75 MG PO CP24: ORAL_CAPSULE | ORAL | 2 refills | Status: DC

## 2019-11-28 NOTE — Telephone Encounter (Signed)
Katelyn Galloway.  Will you please refill her venlafaxine to the pharmacy in [place]? I tried but wasn't able.  90 day supply, no refills.

## 2019-11-29 ENCOUNTER — Other Ambulatory Visit: Payer: Self-pay | Admitting: Family Medicine

## 2019-11-29 MED ORDER — VENLAFAXINE HCL ER 75 MG PO CP24: ORAL_CAPSULE | ORAL | 2 refills | Status: AC

## 2019-12-07 ENCOUNTER — Other Ambulatory Visit: Payer: Self-pay

## 2019-12-07 NOTE — Telephone Encounter (Signed)
Baclofen refill request: Last filled on 06/27/2019 #180 with 1 refill LOV 05/29/2019.  No future appointment

## 2019-12-07 NOTE — Telephone Encounter (Signed)
Please call patient and find out if she needs this medication. She has moved to [place].

## 2019-12-19 ENCOUNTER — Encounter: Payer: Self-pay | Admitting: *Deleted

## 2019-12-20 ENCOUNTER — Telehealth: Payer: Self-pay | Admitting: *Deleted

## 2019-12-20 NOTE — Telephone Encounter (Signed)
Received fax from [facility]. Emgality approved through 12/20/2020.

## 2019-12-20 NOTE — Telephone Encounter (Signed)
Received temporary supply notice for Emgality from [employer]. I completed a PA on CMM for Emgality 120 mg. Key: [id number]. Awaiting determination from [employer].   Per the chart, patient has recently moved to [place] and is in the process of establishing with a new PCP.

## 2019-12-29 ENCOUNTER — Other Ambulatory Visit: Payer: Self-pay | Admitting: Family Medicine

## 2019-12-29 MED ORDER — BACLOFEN 10 MG PO TABS: 10.0000 mg | ORAL_TABLET | Freq: Two times a day (BID) | ORAL | 0 refills | Status: AC | PRN
